# Patient Record
Sex: Female | Born: 1965 | Race: White | Hispanic: No | Marital: Married | Smoking: Never smoker
Health system: Southern US, Community
[De-identification: ages and names within clinical notes are randomized; demographics above are authoritative.]

## PROBLEM LIST (undated history)

## (undated) DIAGNOSIS — J309 Allergic rhinitis, unspecified: Secondary | ICD-10-CM

## (undated) DIAGNOSIS — E785 Hyperlipidemia, unspecified: Secondary | ICD-10-CM

## (undated) DIAGNOSIS — R112 Nausea with vomiting, unspecified: Secondary | ICD-10-CM

## (undated) DIAGNOSIS — E669 Obesity, unspecified: Secondary | ICD-10-CM

## (undated) DIAGNOSIS — R42 Dizziness and giddiness: Secondary | ICD-10-CM

## (undated) DIAGNOSIS — M5432 Sciatica, left side: Secondary | ICD-10-CM

## (undated) DIAGNOSIS — F32A Depression, unspecified: Secondary | ICD-10-CM

## (undated) DIAGNOSIS — Z8489 Family history of other specified conditions: Secondary | ICD-10-CM

## (undated) DIAGNOSIS — I10 Essential (primary) hypertension: Secondary | ICD-10-CM

## (undated) DIAGNOSIS — D649 Anemia, unspecified: Secondary | ICD-10-CM

## (undated) DIAGNOSIS — Z9889 Other specified postprocedural states: Secondary | ICD-10-CM

## (undated) DIAGNOSIS — F329 Major depressive disorder, single episode, unspecified: Secondary | ICD-10-CM

## (undated) DIAGNOSIS — E8881 Metabolic syndrome: Secondary | ICD-10-CM

## (undated) DIAGNOSIS — L409 Psoriasis, unspecified: Secondary | ICD-10-CM

## (undated) HISTORY — DX: Allergic rhinitis, unspecified: J30.9

## (undated) HISTORY — DX: Obesity, unspecified: E66.9

## (undated) HISTORY — PX: BARIATRIC SURGERY: SHX1103

## (undated) HISTORY — DX: Depression, unspecified: F32.A

## (undated) HISTORY — PX: CHOLECYSTECTOMY: SHX55

## (undated) HISTORY — PX: DILATION AND CURETTAGE OF UTERUS: SHX78

## (undated) HISTORY — DX: Essential (primary) hypertension: I10

## (undated) HISTORY — DX: Psoriasis, unspecified: L40.9

## (undated) HISTORY — DX: Major depressive disorder, single episode, unspecified: F32.9

## (undated) HISTORY — DX: Anemia, unspecified: D64.9

## (undated) HISTORY — DX: Metabolic syndrome: E88.81

## (undated) HISTORY — DX: Hyperlipidemia, unspecified: E78.5

## (undated) HISTORY — DX: Metabolic syndrome: E88.810

---

## 2006-05-21 ENCOUNTER — Ambulatory Visit: Payer: Self-pay | Admitting: Family Medicine

## 2007-04-10 IMAGING — RF DG MULTISYS EXAM
1 series · 12 of 12 positions shown · non-contrast
Comparison: none

[Series 102: run · 12 of 12 slices shown]
[im 1/12]
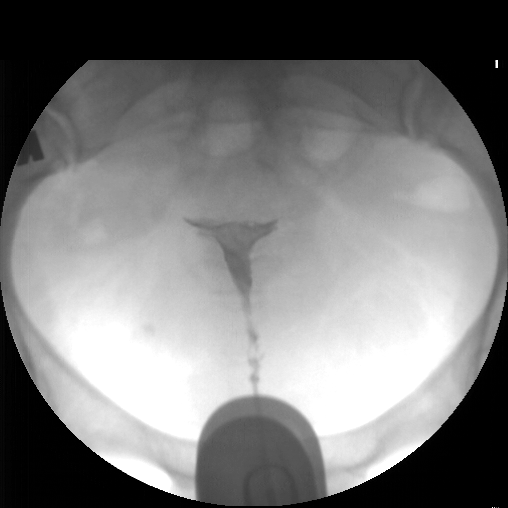
[im 2/12]
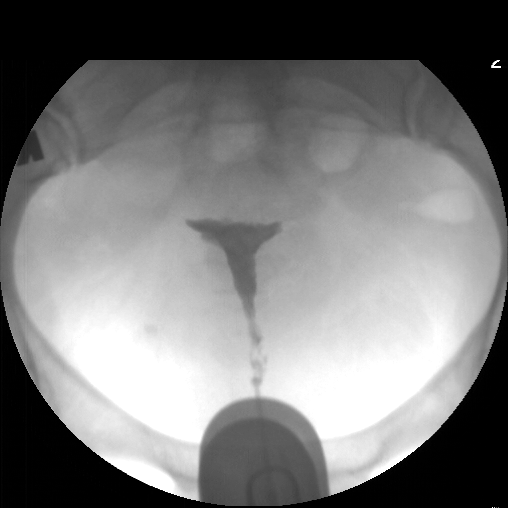
[im 3/12]
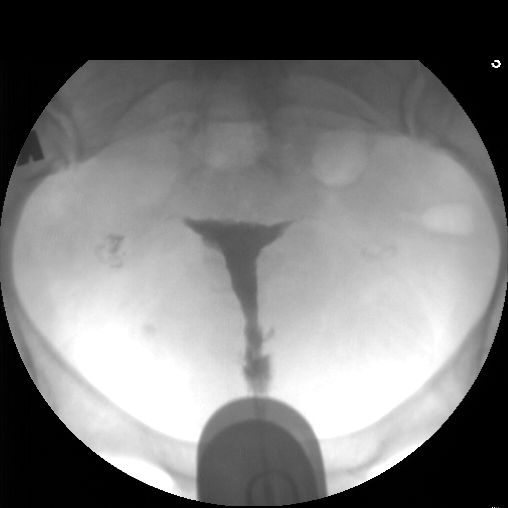
[im 4/12]
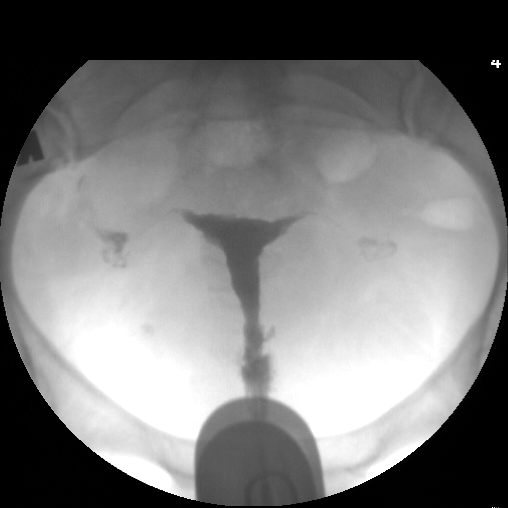
[im 5/12]
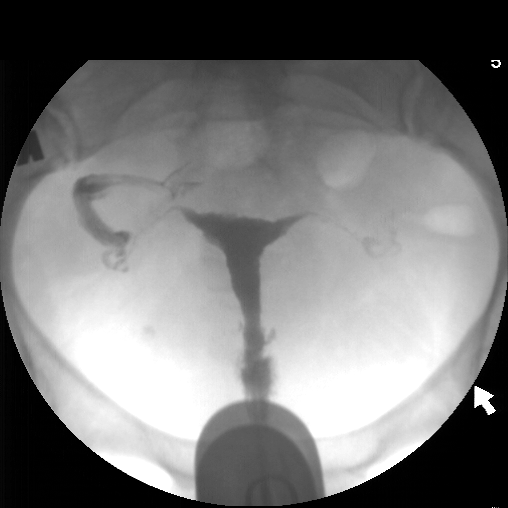
[im 6/12]
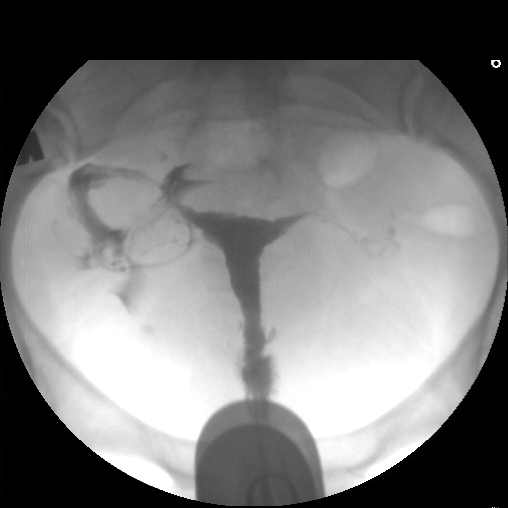
[im 7/12]
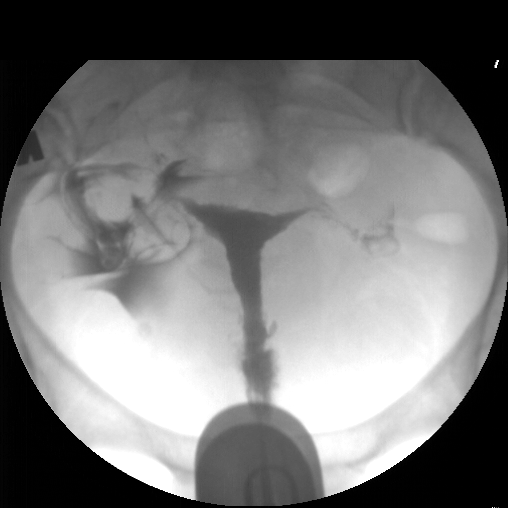
[im 8/12]
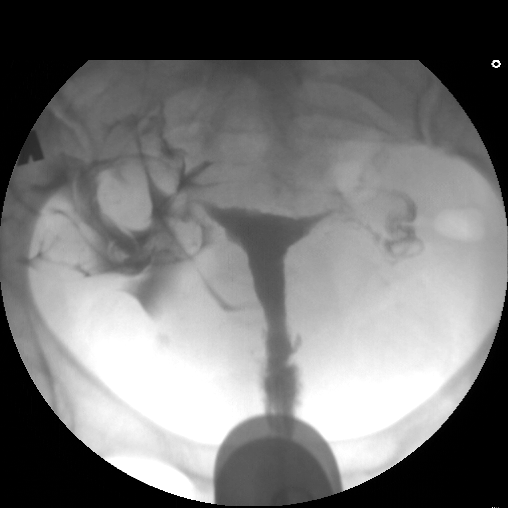
[im 9/12]
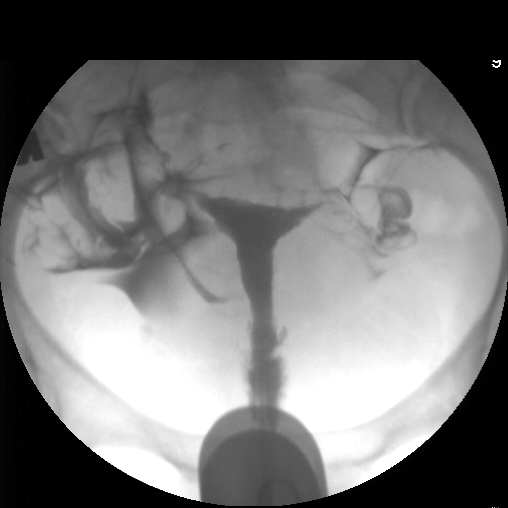
[im 10/12]
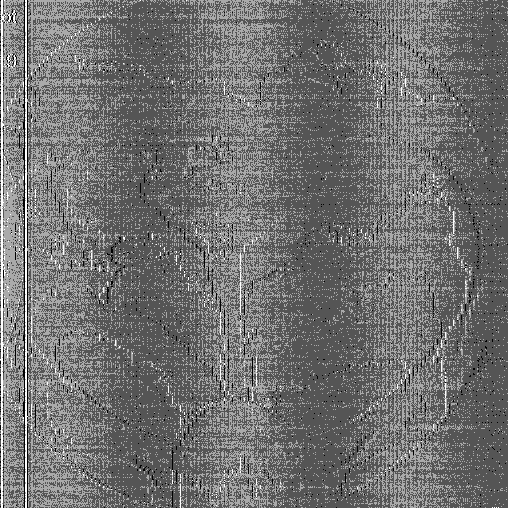
[im 11/12]
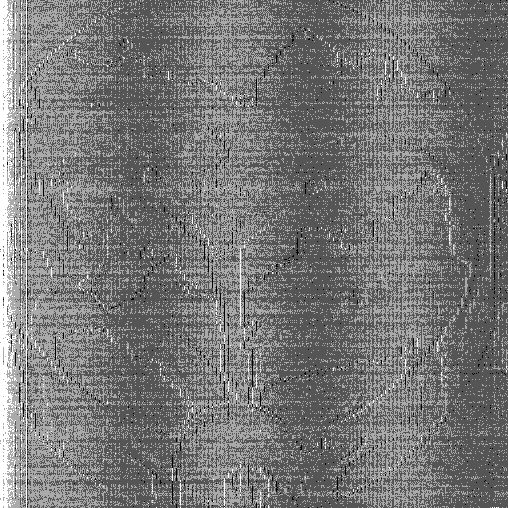
[im 12/12]
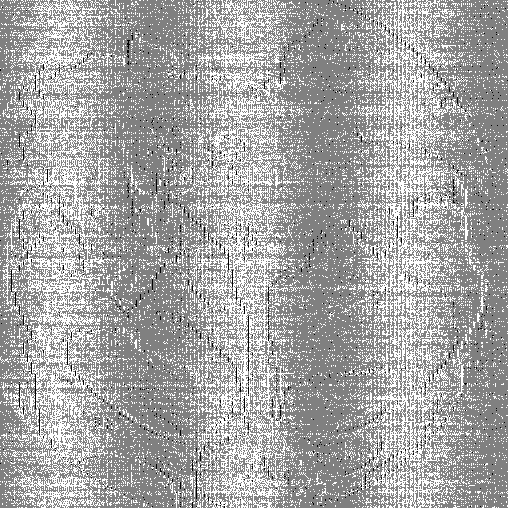

[12 of 12 positions shown; findings below may reference images not displayed]

Canned report from images found in remote index.

Refer to host system for actual result text.

## 2007-04-29 ENCOUNTER — Ambulatory Visit (HOSPITAL_COMMUNITY): Admission: RE | Admit: 2007-04-29 | Discharge: 2007-04-29 | Payer: Self-pay | Admitting: Gynecology

## 2007-06-10 ENCOUNTER — Ambulatory Visit (HOSPITAL_COMMUNITY): Admission: AD | Admit: 2007-06-10 | Discharge: 2007-06-10 | Payer: Self-pay | Admitting: Obstetrics & Gynecology

## 2007-06-10 ENCOUNTER — Encounter (INDEPENDENT_AMBULATORY_CARE_PROVIDER_SITE_OTHER): Payer: Self-pay | Admitting: Obstetrics & Gynecology

## 2007-12-14 ENCOUNTER — Ambulatory Visit: Payer: Self-pay | Admitting: Family Medicine

## 2007-12-23 ENCOUNTER — Ambulatory Visit: Payer: Self-pay | Admitting: Family Medicine

## 2008-01-31 IMAGING — US US OB TRANSVAGINAL MODIFY
1 series · 14 of 28 positions shown · non-contrast
Comparison: none

OBSTETRICAL ULTRASOUND:

 This ultrasound exam was performed in the [HOSPITAL] Ultrasound Department.  The OB US report was generated in the AS system, and faxed to the ordering physician.  This report is also available in [REDACTED] PACS.

[Series 1: us ob transvaginal modify · 0.27mm/px · 14 of 48 slices shown]
[im 2/48]
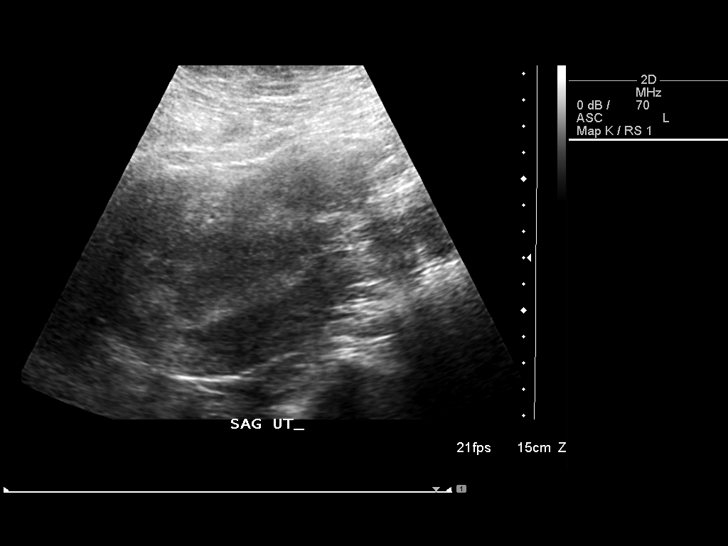
[im 6/48]
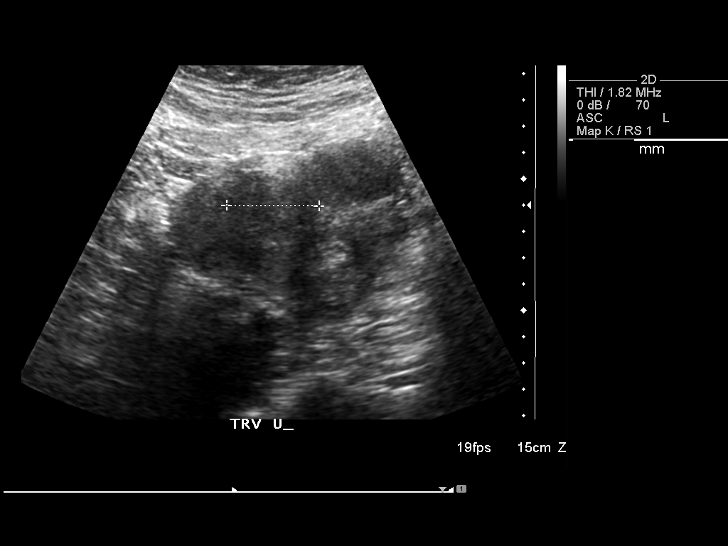
[im 9/48]
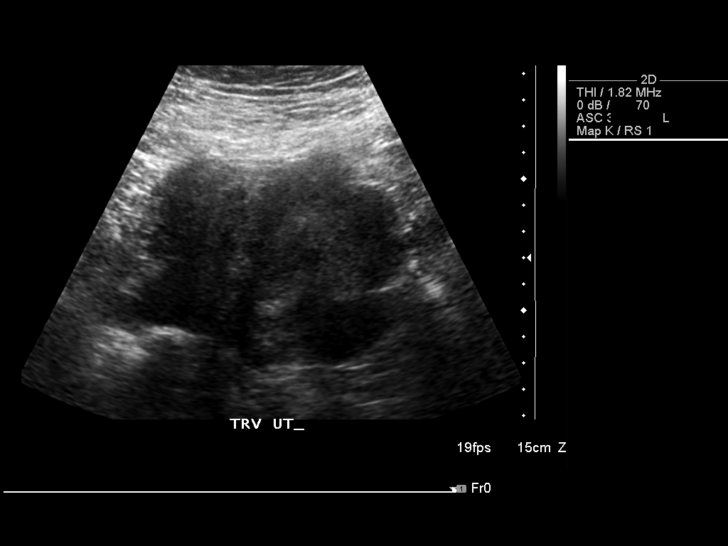
[im 13/48]
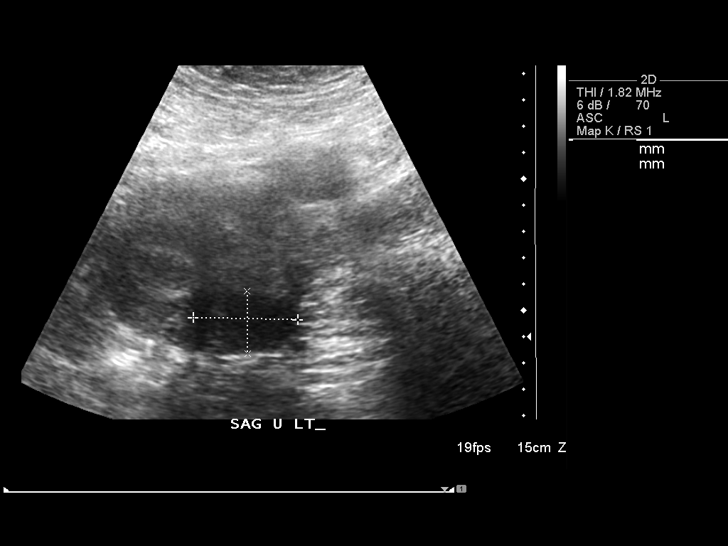
[im 16/48]
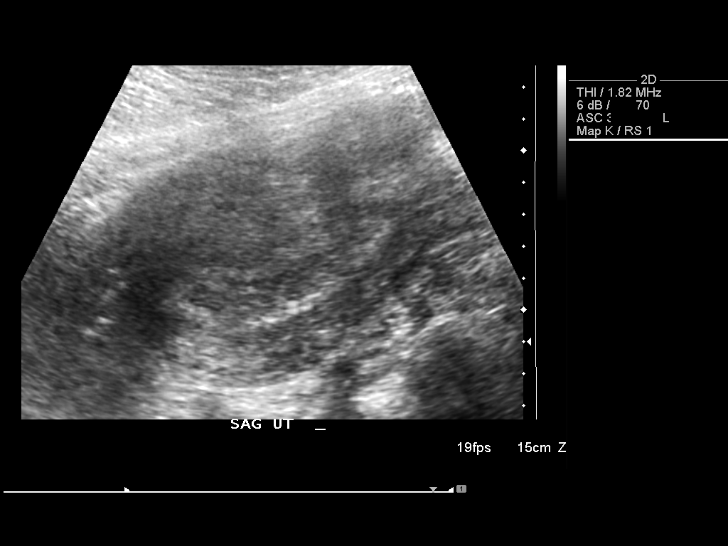
[im 20/48]
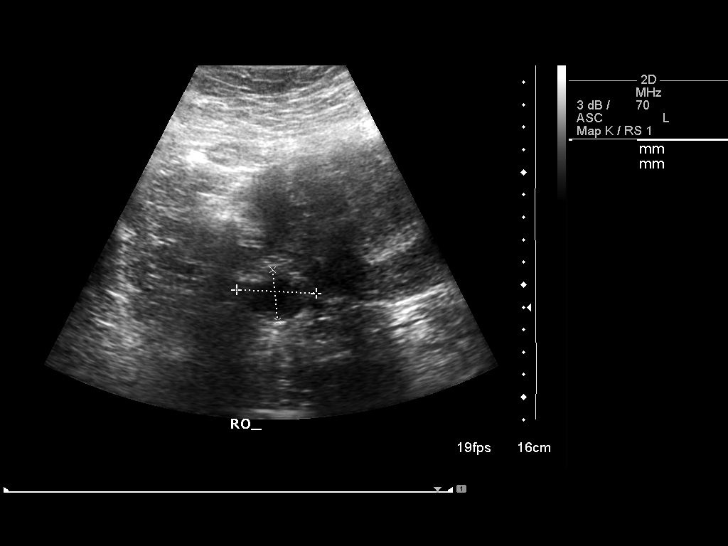
[im 23/48]
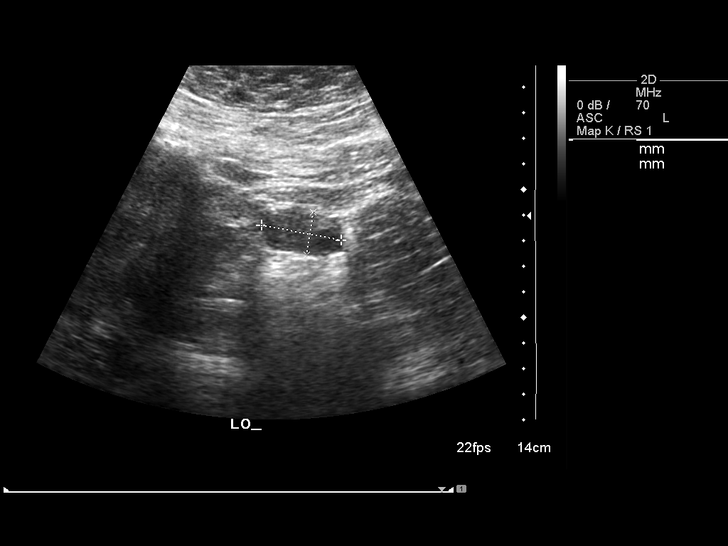
[im 27/48]
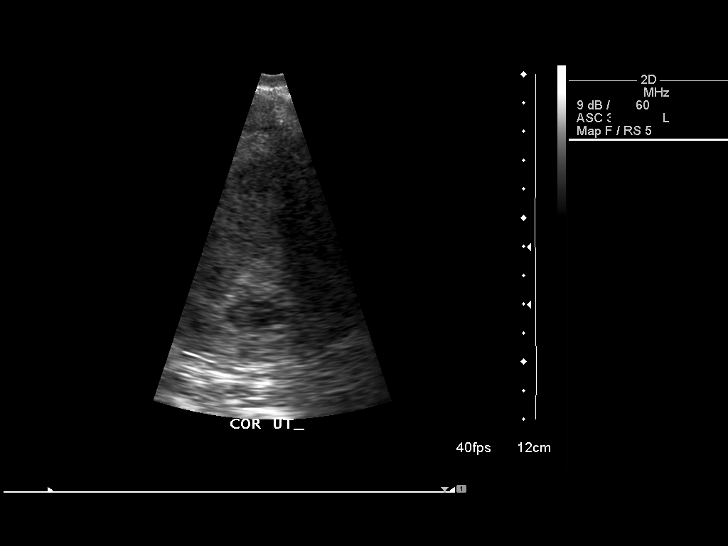
[im 30/48]
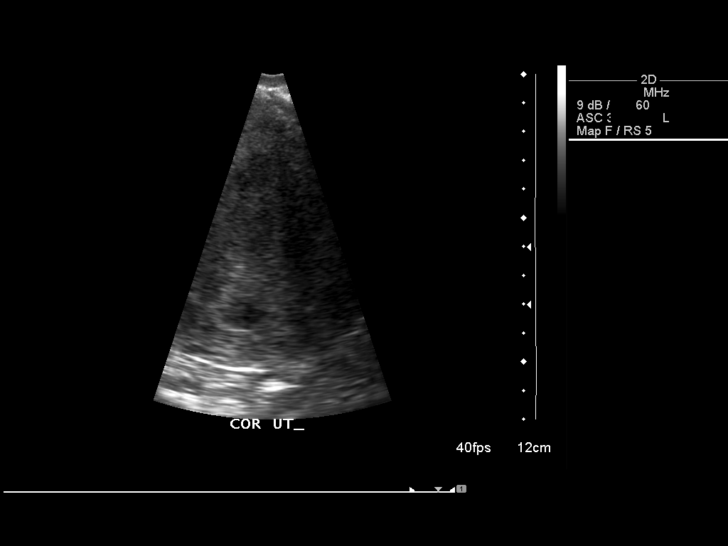
[im 34/48]
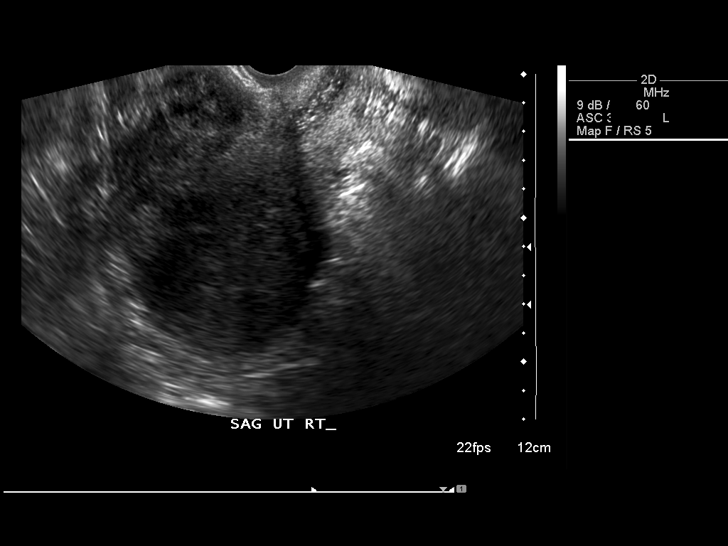
[im 37/48]
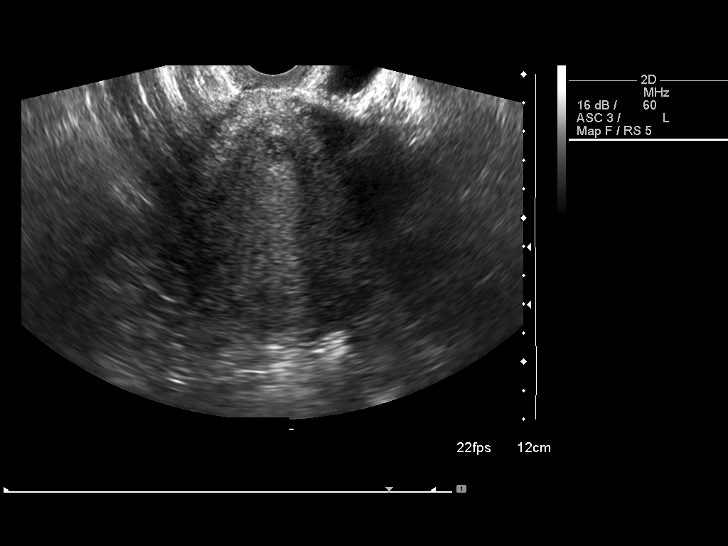
[im 41/48]
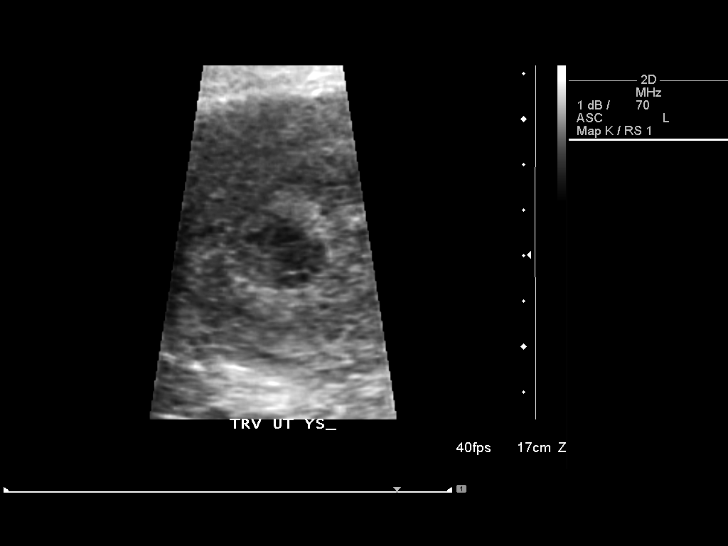
[im 44/48]
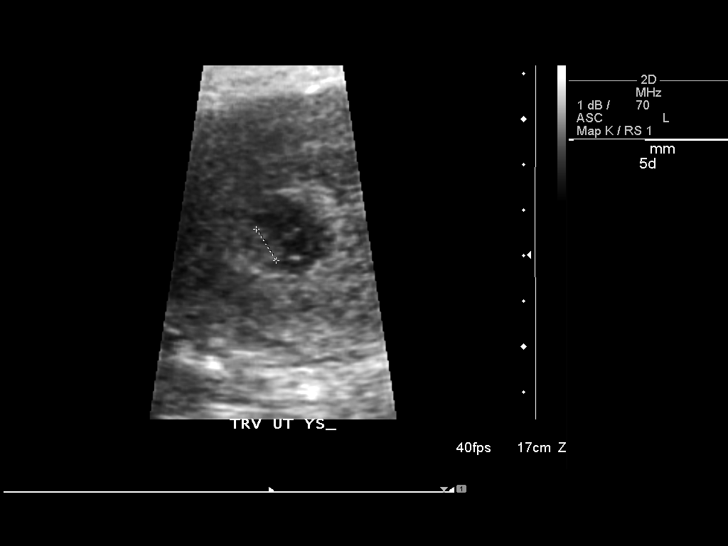
[im 48/48]
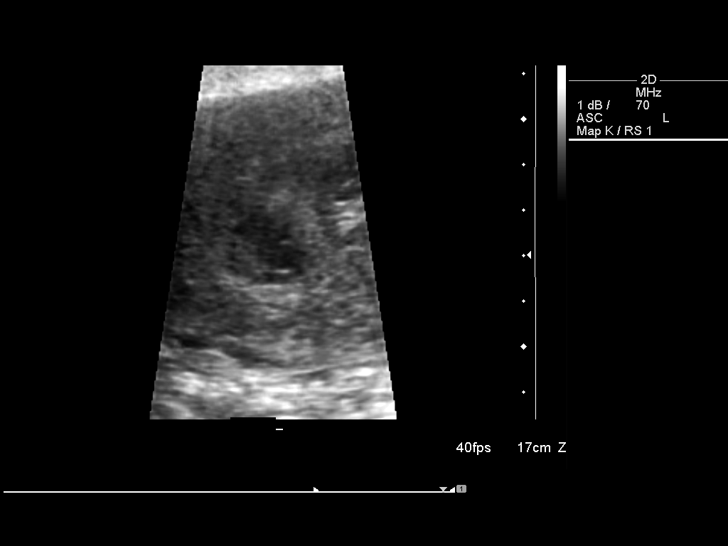

[14 of 28 positions shown; findings below may reference images not displayed]

IMPRESSION: See AS Obstetric US report.

## 2008-06-21 ENCOUNTER — Ambulatory Visit: Payer: Self-pay | Admitting: General Surgery

## 2008-09-25 IMAGING — US ULTRASOUND RIGHT BREAST
1 series · 13 of 13 positions shown · non-contrast
Comparison: none

REASON FOR EXAM: right breast density  US if needed
COMMENTS:

[Series 1: ultrasound right breast · 13 of 13 slices shown]
[im 1/13]
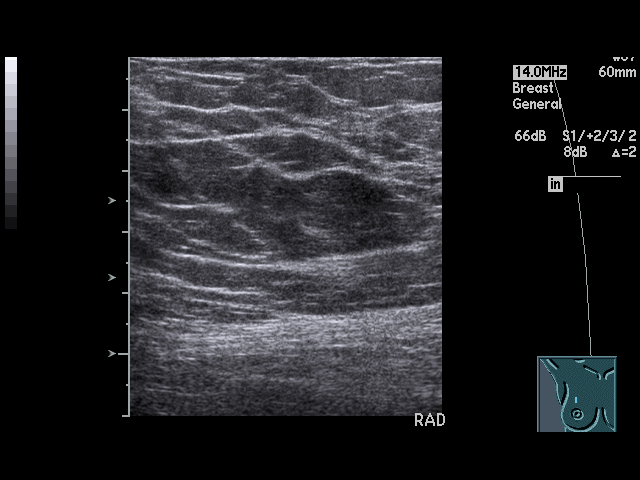
[im 2/13]
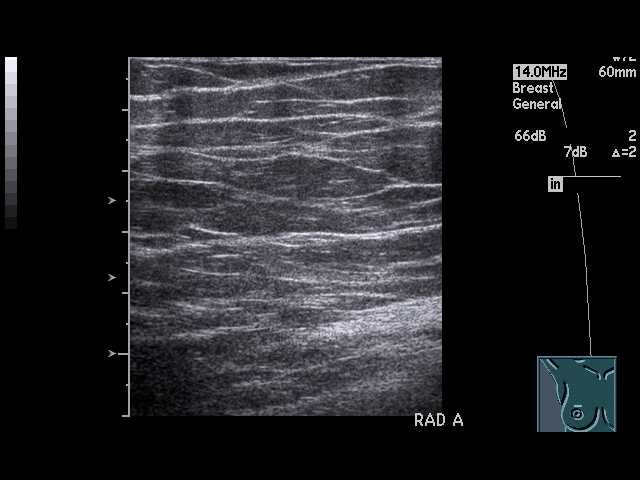
[im 3/13]
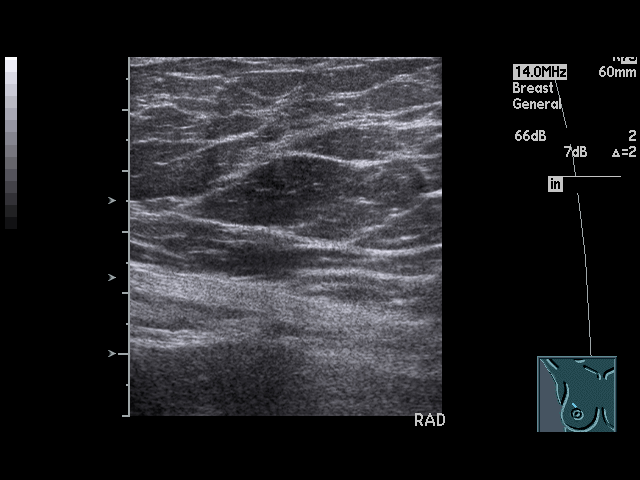
[im 4/13]
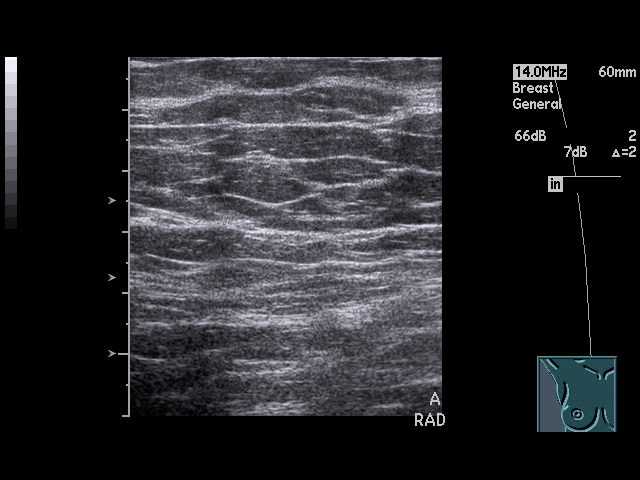
[im 5/13]
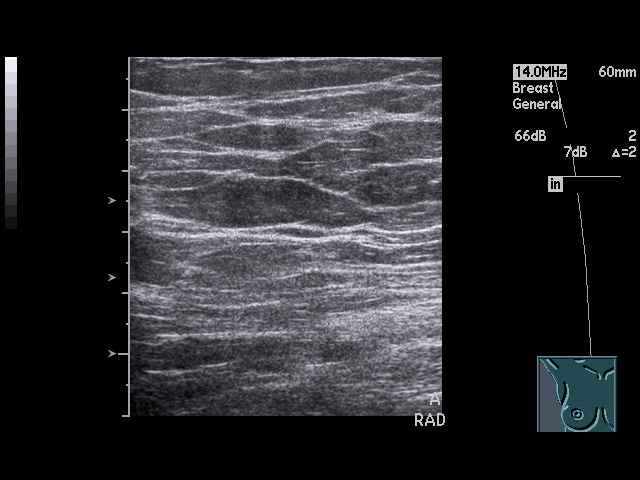
[im 6/13]
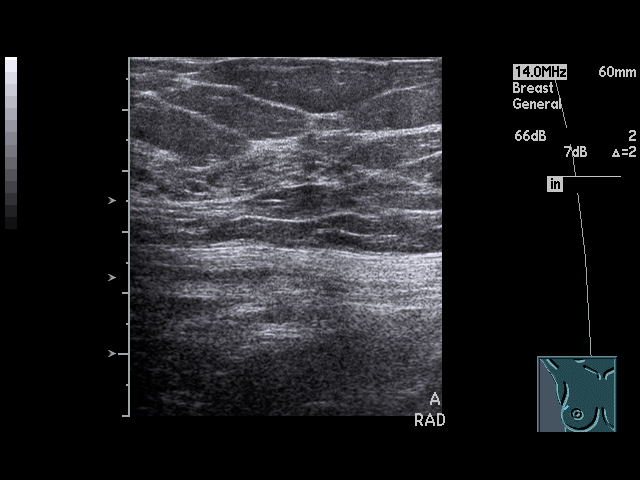
[im 7/13]
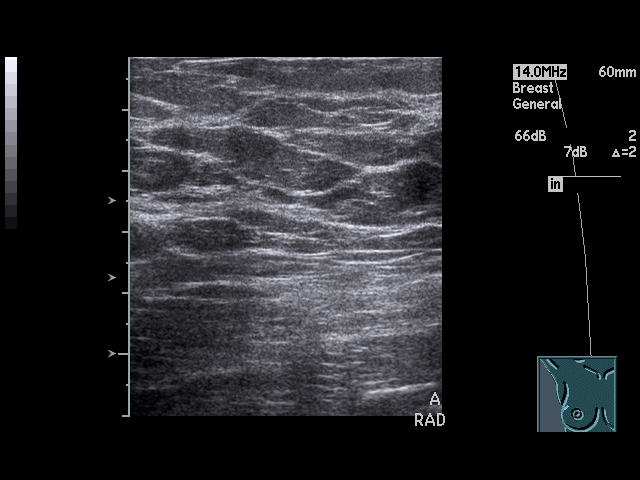
[im 8/13]
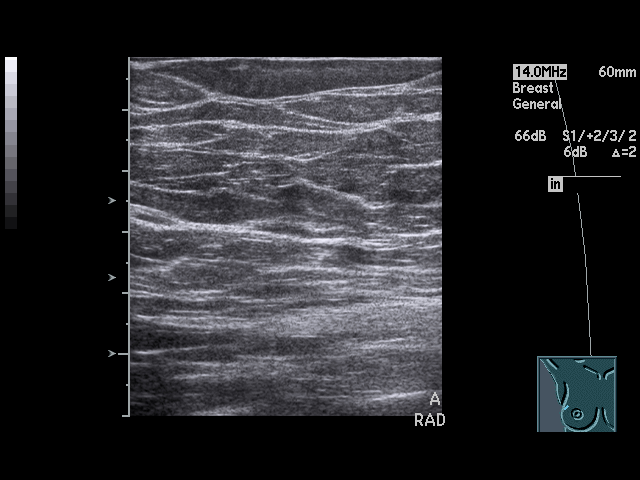
[im 9/13]
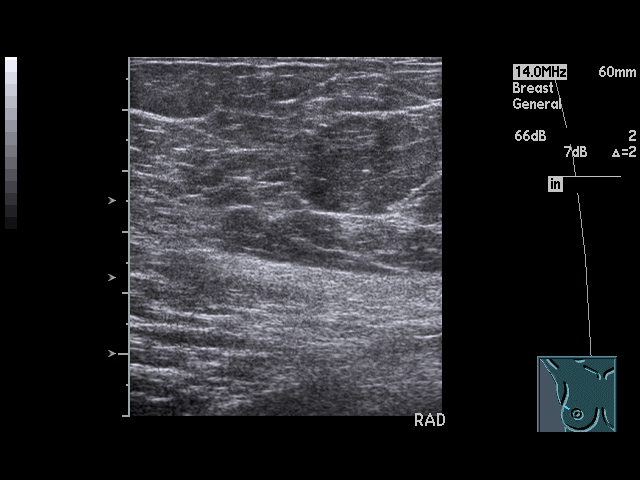
[im 10/13]
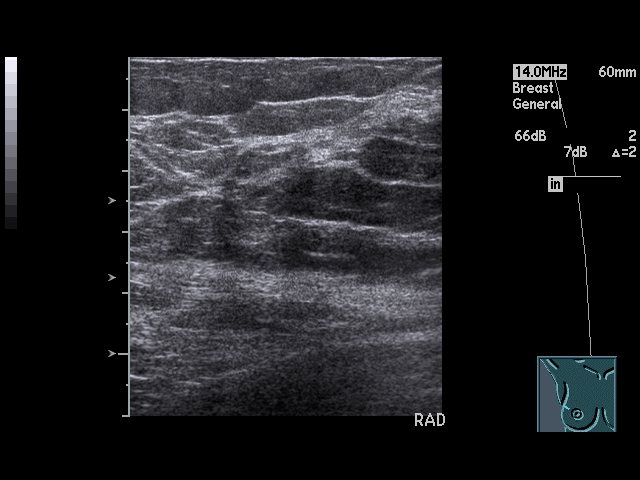
[im 11/13]
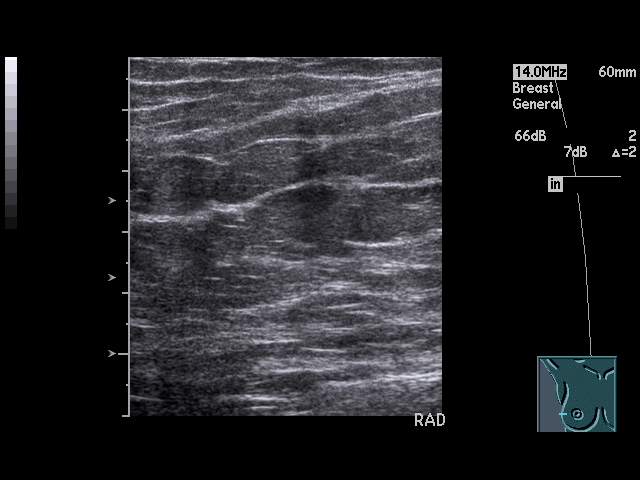
[im 12/13]
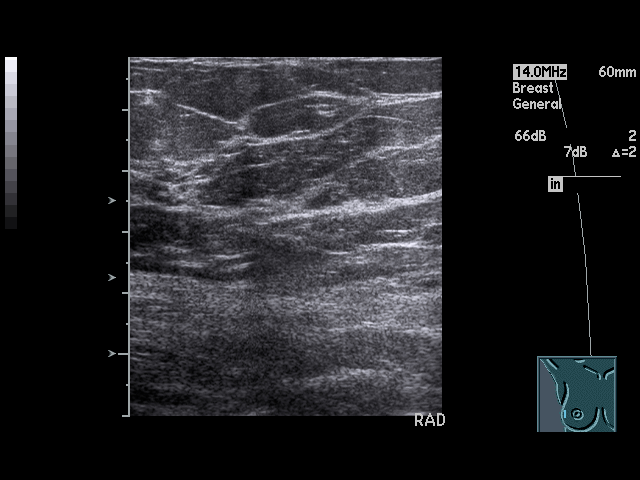
[im 13/13]
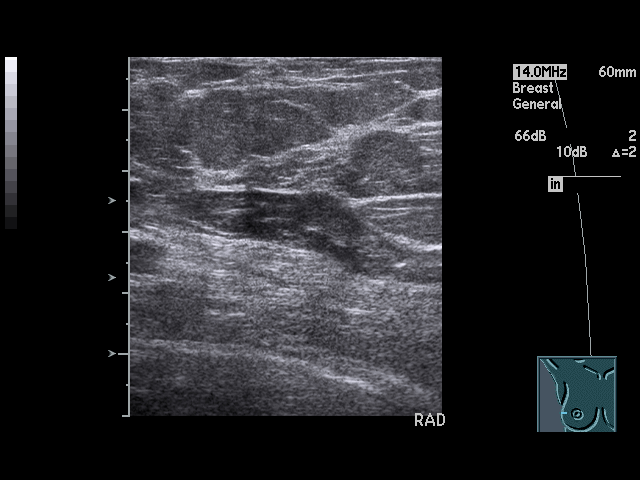

[13 of 13 positions shown; findings below may reference images not displayed]

PROCEDURE:     US  - US BREAST RIGHT  - December 23, 2007 [DATE]

RESULT:     This is a redictation of a previously dictated report.

The region of interest within the RIGHT breast was further evaluated with
ultrasound from the 9 o'clock to the 12 o'clock position.  No solid or
cystic sonographic abnormalities are identified.
IMPRESSION: Unremarkable focused RIGHT breast ultrasound.  Please refer to the
additional radiographic view dictation for complete discussion.

## 2008-12-14 ENCOUNTER — Ambulatory Visit: Payer: Self-pay | Admitting: General Surgery

## 2008-12-21 ENCOUNTER — Ambulatory Visit: Payer: Self-pay | Admitting: General Surgery

## 2009-09-17 IMAGING — MG MAM DGTL SCREENING MAMMO W/CAD
1 series · 6 of 6 positions shown · non-contrast
Comparison: none

REASON FOR EXAM: SCREENING MAMMOGRAM YDU.NY YEARLY
COMMENTS:  Submitted by practice: [HOSPITAL] Scheduled by
user:

PROCEDURE:     MAM - MAM DGTL SCREENING MAMMO W/CAD  - December 14, 2008  [DATE]
RESULT:     Comparison is made to 12/14/2007, 12/23/2007 and 06/21/2008.

[R CC · right · 6 of 6 slices shown]
[im 1/6]
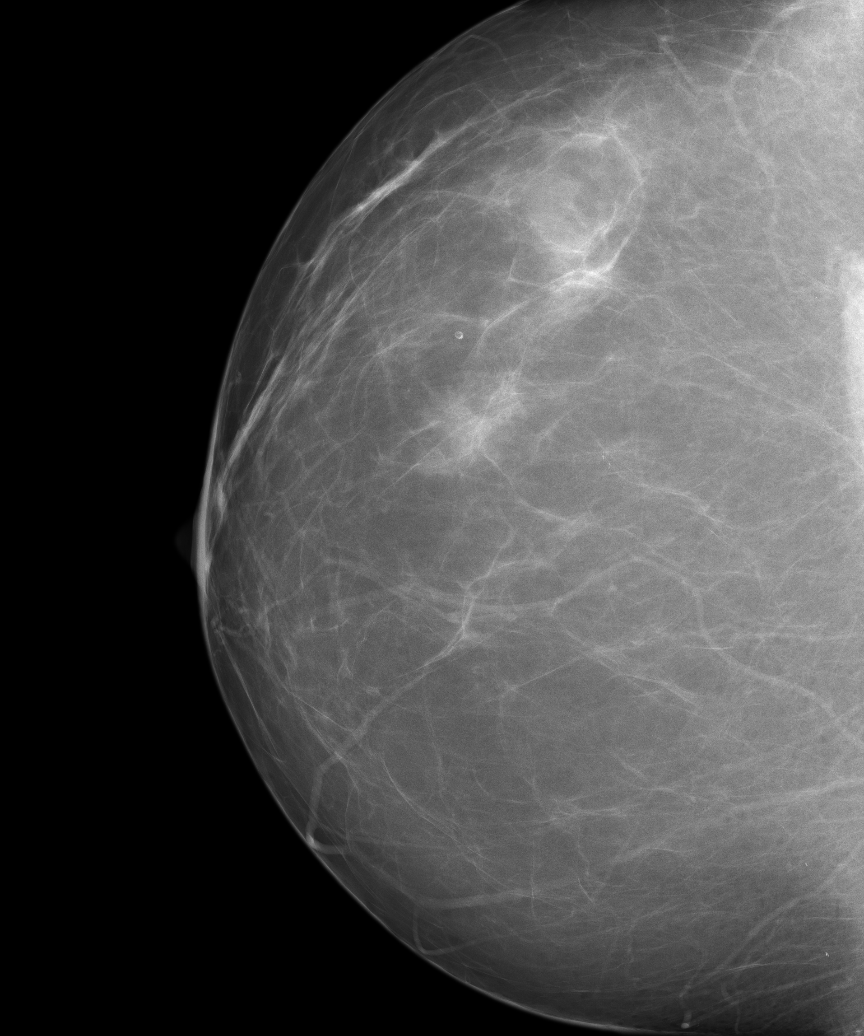
[im 2/6]
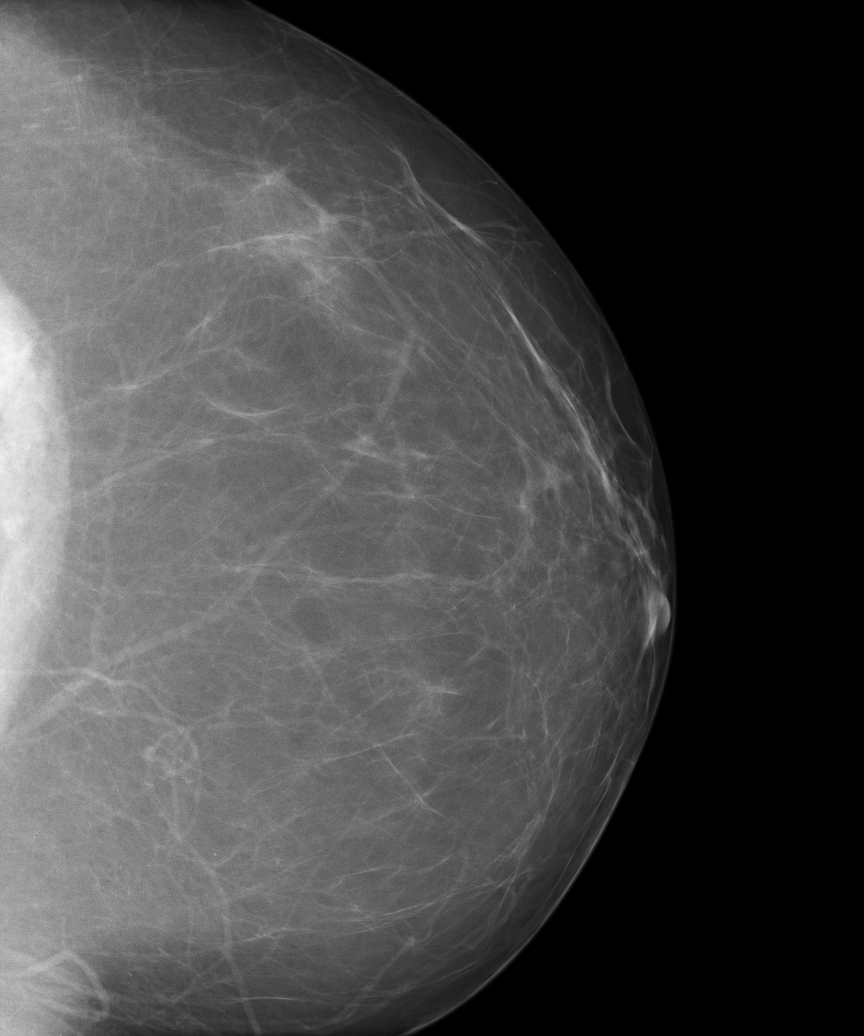
[im 3/6]
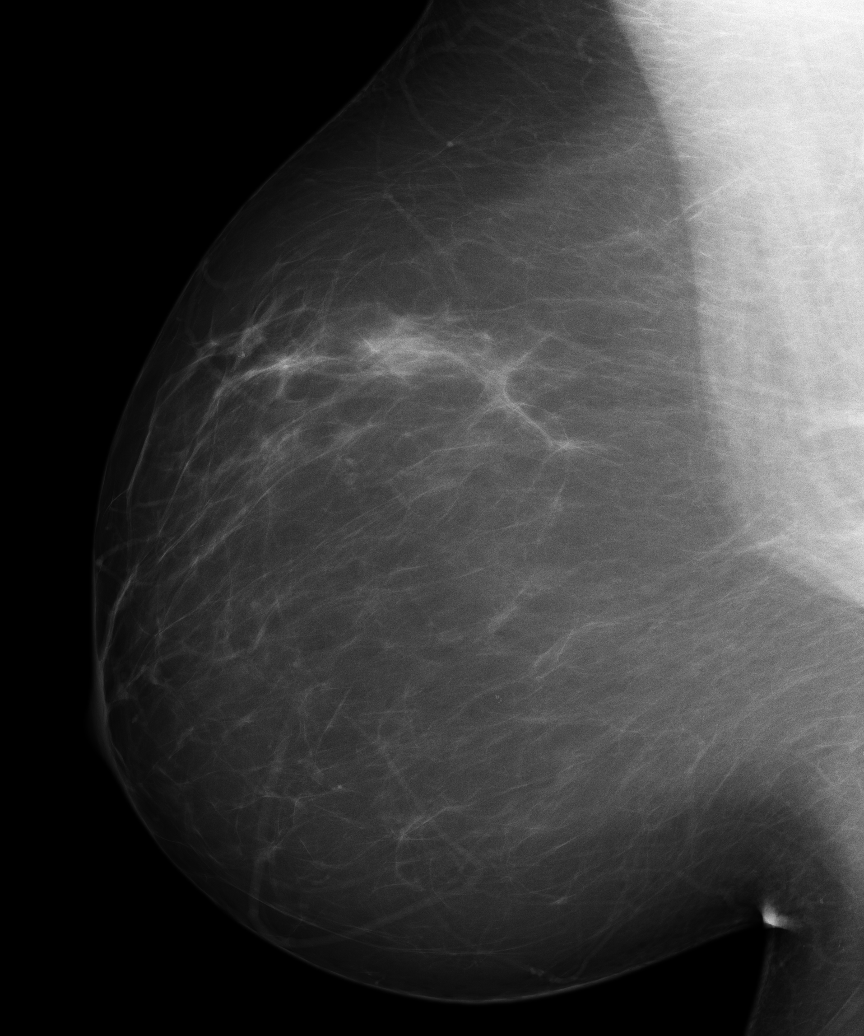
[im 4/6]
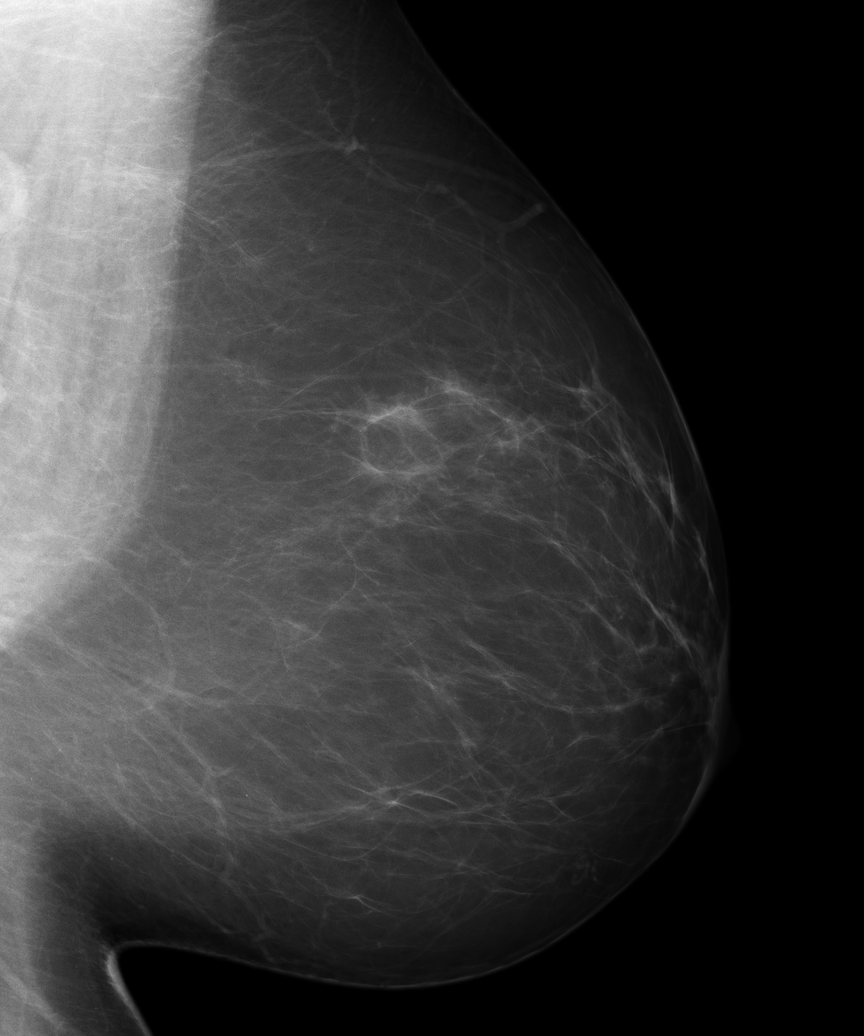
[im 5/6]
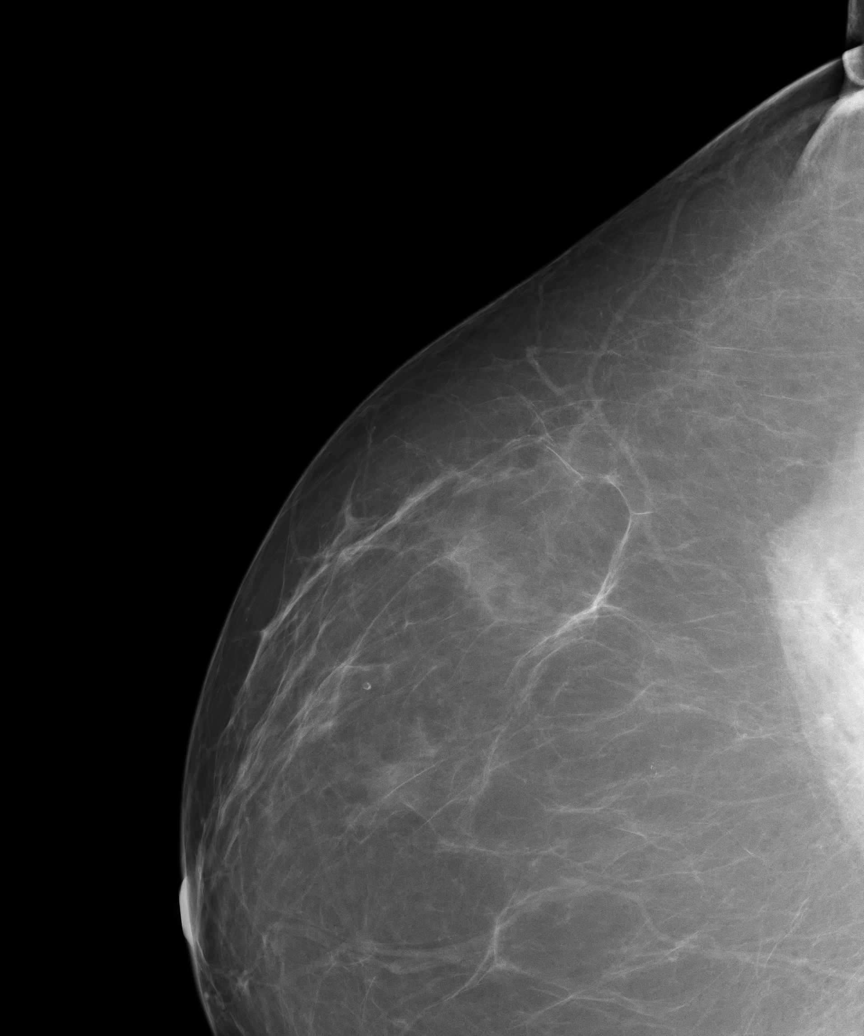
[im 6/6]
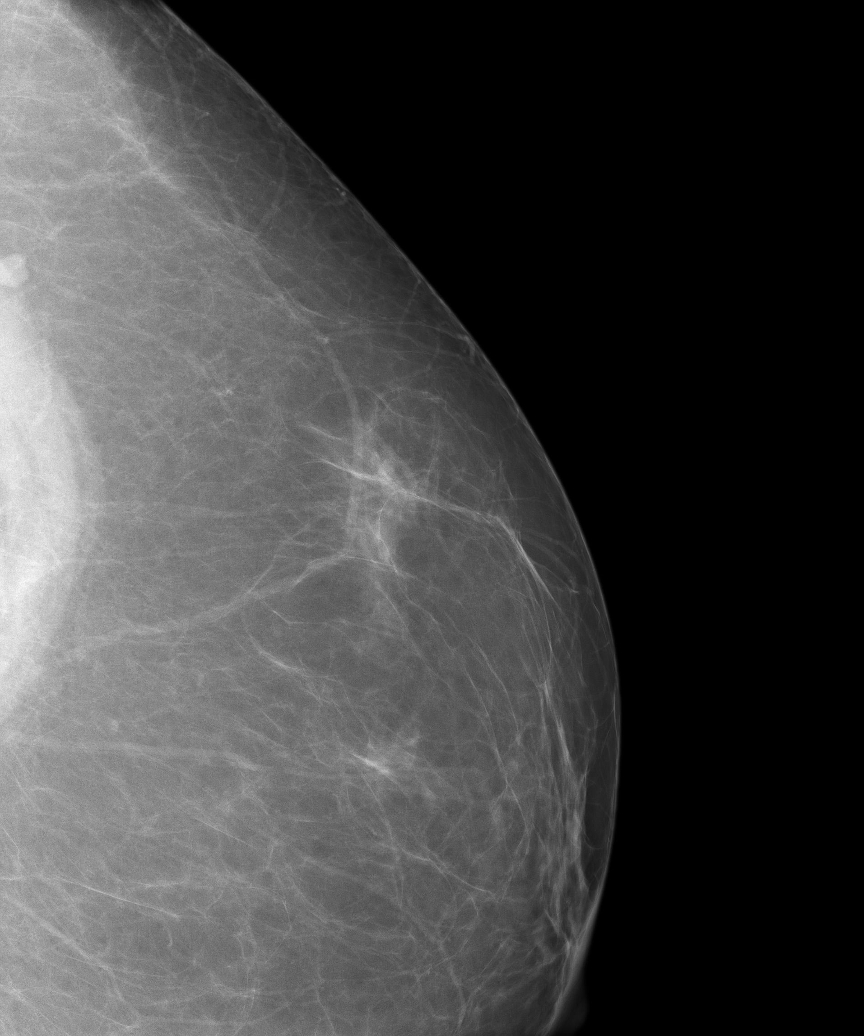

[6 of 6 positions shown; findings below may reference images not displayed]

FINDINGS: Bilateral breasts demonstrate a scattered fibroglandular density.
There is a macrolobulated nodule in the lateral left breast on the
exaggerated lateral CC view. This is not delineated on the MLO view. There
are no other areas of dominant masses, architectural distortion or clusters
of suspicious microcalcifications.
IMPRESSION: Nodular density in the lateral left breast on the
exaggerated CC view. Recommend further evaluation with spot compression
views of the far upper outer left breast with possible ultrasound to follow.

BI-RADS: Category 0 - Needs Additional Imaging Evaluation

## 2010-12-31 NOTE — Op Note (Signed)
Ashlee Cruz, REXRODE                 ACCOUNT NO.:  [id number]   MEDICAL RECORD NO.:  [id number]          PATIENT TYPE:  AMB   LOCATION:  [facility]                          FACILITY:  [facility]   PHYSICIAN:  Ilda Mori, M.D.   DATE OF BIRTH:  [birth year]   DATE OF PROCEDURE:  06/10/2007  DATE OF DISCHARGE:                               OPERATIVE REPORT   PREOPERATIVE DIAGNOSIS:  Incomplete abortion.   POSTOPERATIVE DIAGNOSIS:  Complete abortion.   PROCEDURE:  Dilatation and evacuation.   SURGEON:  Ilda Mori, M.D.   ANESTHESIA:  Paracervical block with IV sedation.   SPECIMENS:  Products of conception and were sent to pathology.   ESTIMATED BLOOD LOSS:  Minimal.   COMPLICATIONS:  None.   INDICATIONS:  This is a 45 year old primigravid female who presented to  the hospital with an incomplete abortion.  The patient had been found to  have a fetal demise at 12 weeks on ultrasound in the office on October  21.  She was scheduled for surgery on October 24.  She began cramping  and passed fetal tissue approximately 1:00 a.m. this morning and  presented to the hospital with heavy bleeding and cramps.  The decision  was made to proceed with dilatation and evacuation to remove the  retained placental tissue.  While waiting for surgery, the patient  passed what appeared to be an intact placenta, but the decision was made  to proceed with D&E to ensure that no was placental fragments, remained.   DESCRIPTION OF PROCEDURE:  The patient is taken to the operating placed  in the dorsal supine position, IV sedation was administered, and a  paracervical block was placed after the vagina and perineum were prepped  and draped in sterile fashion.  A #10 suction curette was introduced  into the endometrial cavity without dilatation, and a suction aspiration  was performed, followed by sharp curettage.  Minimal tissue was  obtained.  The procedure was then terminated, and the patient left the  operating room in good condition.      Ilda Mori, M.D.  Electronically Signed     RK/MEDQ  D:  06/10/2007  T:  06/10/2007  Job:  [id number]

## 2011-05-28 LAB — TYPE AND SCREEN

## 2011-05-28 LAB — CBC
HCT: 35.1 — ABNORMAL LOW
MCHC: 34.7
MCV: 92
RBC: 3.81 — ABNORMAL LOW
WBC: 10

## 2011-05-28 LAB — RPR: RPR Ser Ql: NONREACTIVE

## 2011-05-28 LAB — ABO/RH: ABO/RH(D): B POS

## 2011-08-29 ENCOUNTER — Ambulatory Visit: Payer: Self-pay | Admitting: Bariatrics

## 2011-09-04 ENCOUNTER — Ambulatory Visit: Payer: Self-pay | Admitting: Bariatrics

## 2011-09-05 ENCOUNTER — Ambulatory Visit: Payer: Self-pay | Admitting: Bariatrics

## 2011-09-19 ENCOUNTER — Ambulatory Visit: Payer: Self-pay | Admitting: Bariatrics

## 2011-10-17 ENCOUNTER — Ambulatory Visit: Payer: Self-pay | Admitting: Bariatrics

## 2011-10-28 ENCOUNTER — Inpatient Hospital Stay: Payer: Self-pay | Admitting: Bariatrics

## 2011-10-29 LAB — ALBUMIN: Albumin: 3.5 g/dL (ref 3.4–5.0)

## 2011-10-29 LAB — CBC WITH DIFFERENTIAL/PLATELET
Eosinophil %: 0 %
Lymphocyte #: 1.1 10*3/uL (ref 1.0–3.6)
MCH: 30.7 pg (ref 26.0–34.0)
MCHC: 33.8 g/dL (ref 32.0–36.0)
MCV: 91 fL (ref 80–100)
Monocyte #: 0.9 10*3/uL — ABNORMAL HIGH (ref 0.0–0.7)
Platelet: 296 10*3/uL (ref 150–440)
RBC: 4.32 10*6/uL (ref 3.80–5.20)
RDW: 13 % (ref 11.5–14.5)

## 2011-10-29 LAB — BASIC METABOLIC PANEL
BUN: 6 mg/dL — ABNORMAL LOW (ref 7–18)
EGFR (Non-African Amer.): >60
Glucose: 121 mg/dL — ABNORMAL HIGH (ref 65–99)
Sodium: 141 mmol/L (ref 136–145)

## 2011-11-20 ENCOUNTER — Ambulatory Visit: Payer: Self-pay | Admitting: Bariatrics

## 2011-12-17 ENCOUNTER — Ambulatory Visit: Payer: Self-pay | Admitting: Bariatrics

## 2012-06-01 IMAGING — CR DG CHEST 2V
1 series · 2 of 2 positions shown · non-contrast
Comparison: none

REASON FOR EXAM: HTN, Morbid Obesity, High Cholesterol
COMMENTS:

PROCEDURE:     DXR - DXR CHEST PA (OR AP) AND LATERAL  - August 29, 2011  [DATE]
RESULT:     The lung fields are clear. The heart, mediastinal and osseous
structures show no significant abnormalities.

[Series 1: pa · 0.17mm/px · 2 of 2 slices shown]
[im 1/2]
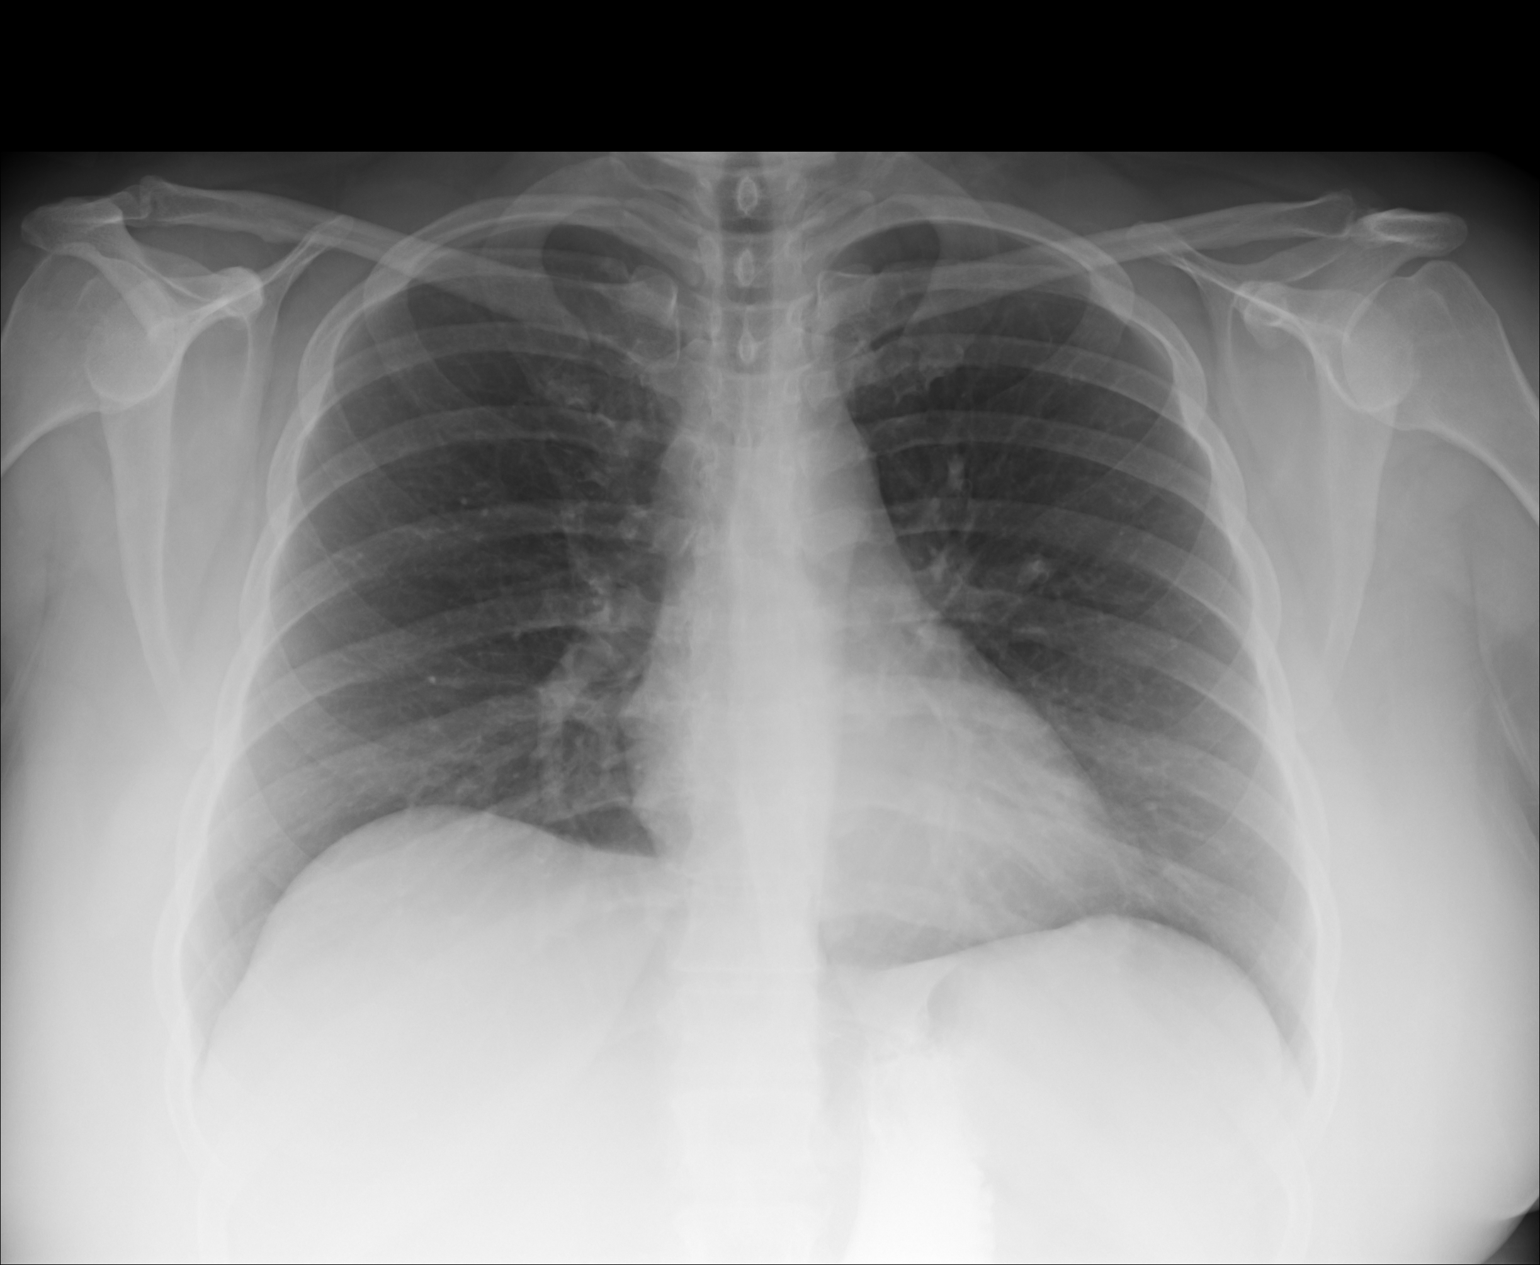
[im 2/2]
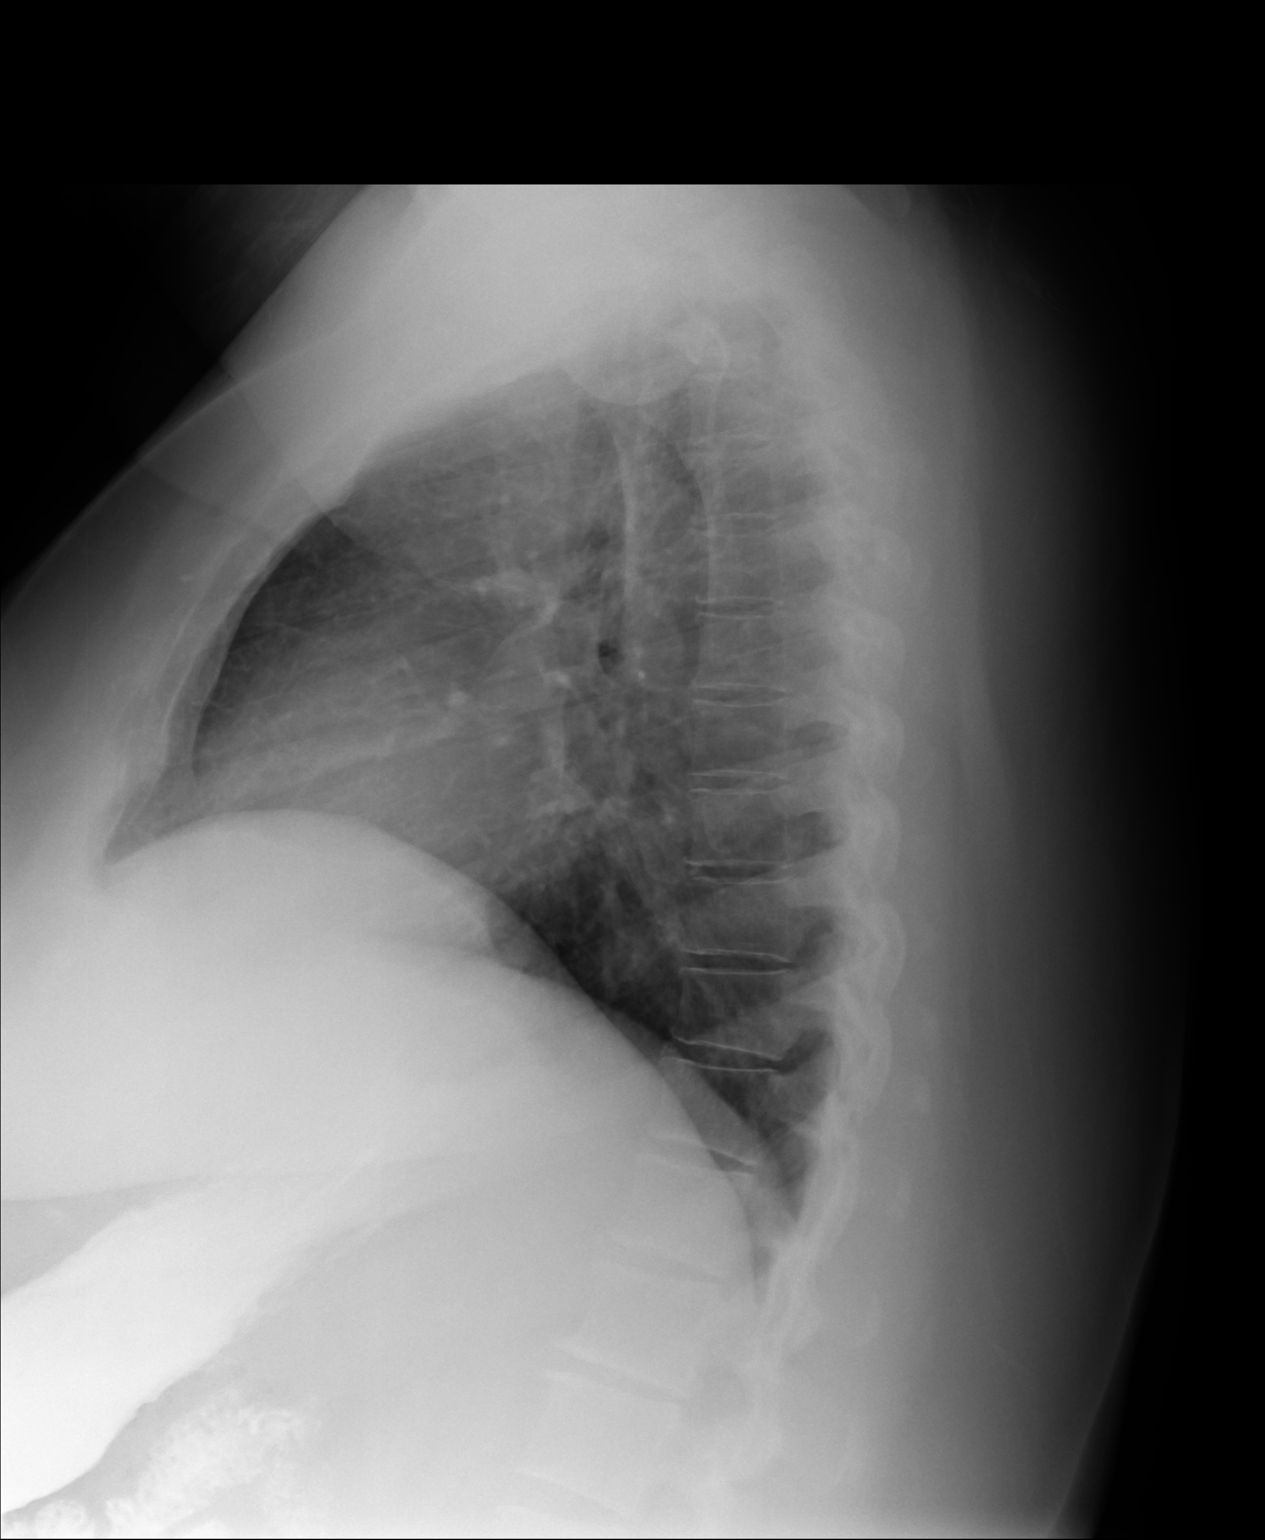

[2 of 2 positions shown; findings below may reference images not displayed]

IMPRESSION: 1.     No acute changes are identified.

## 2012-06-01 IMAGING — RF DG UGI W/O KUB
10 of 14 series · 15 of 24 positions shown · non-contrast
Comparison: none

REASON FOR EXAM: HTN high cholesterol [place] obesity PCOS
COMMENTS:

[Series 1: fluoro_barium 2fps_bw · 0.17mm/px · 2 of 14 frames shown (1 of 10)]
[frame 3/14]
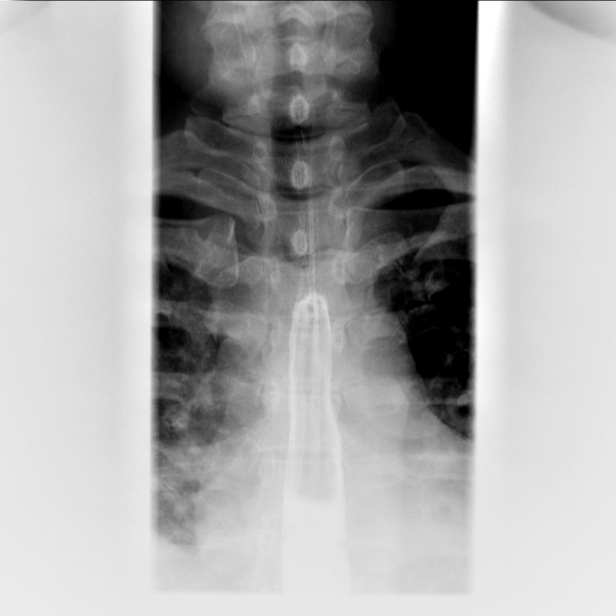
[frame 12/14]
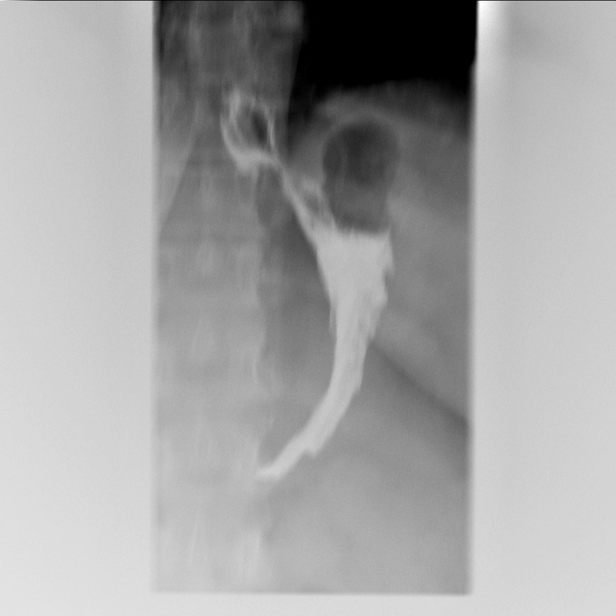

[Series 2: fluoro_barium 2fps_bw · 0.17mm/px · 2 of 10 frames shown (2 of 10)]
[frame 2/10]
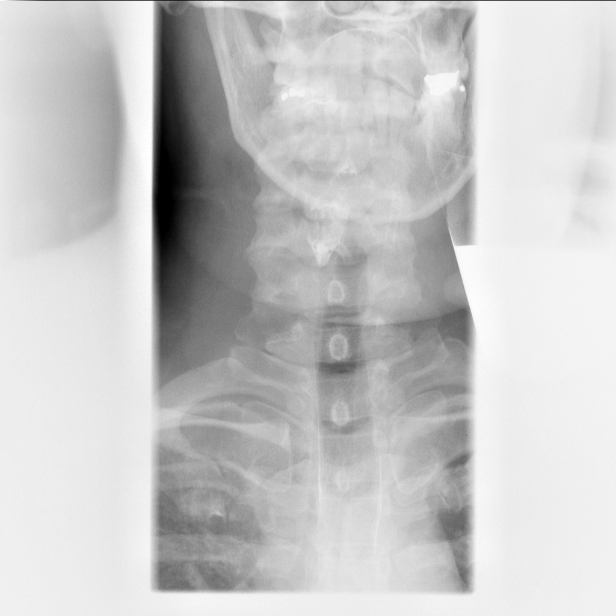
[frame 9/10]
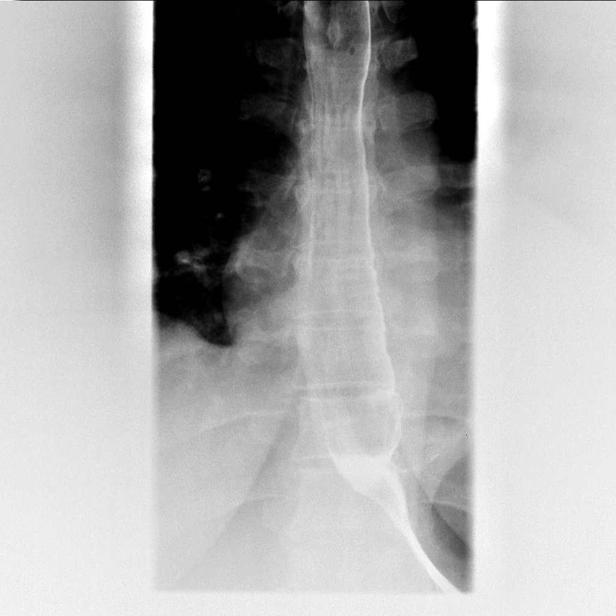

[Series 3: fluoro_barium 2fps_bw · 0.17mm/px · 2 of 3 frames shown (3 of 10)]
[frame 2/3]
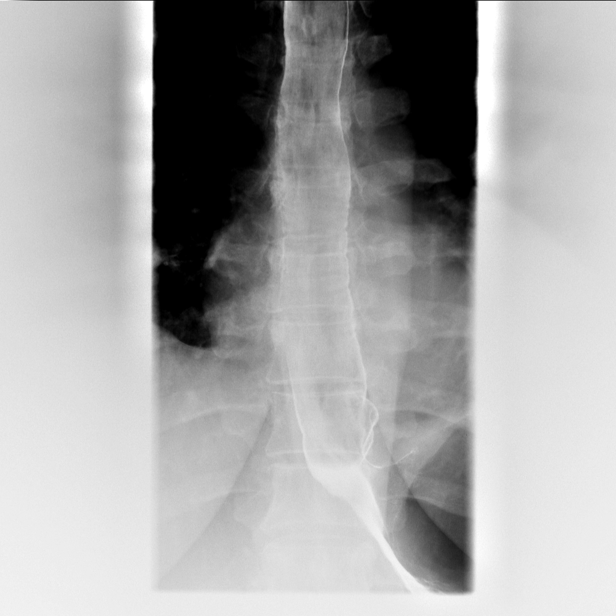
[frame 3/3]
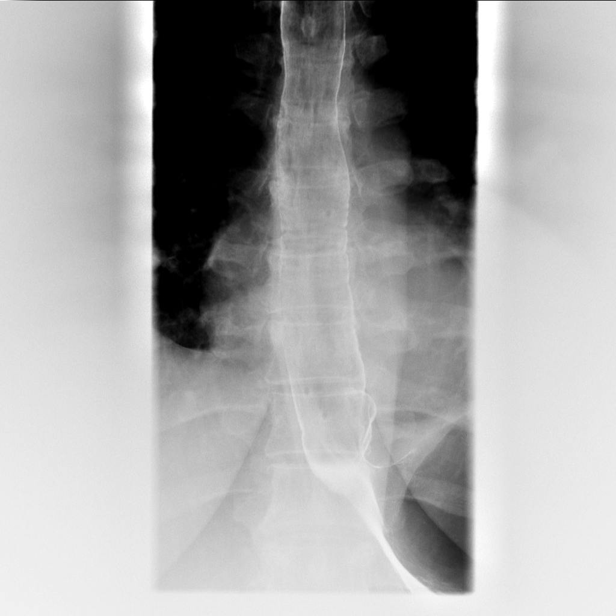

[Series 6: fluoro_barium 2fps_bw · 0.17mm/px · 2 of 11 frames shown (4 of 10)]
[frame 2/11]
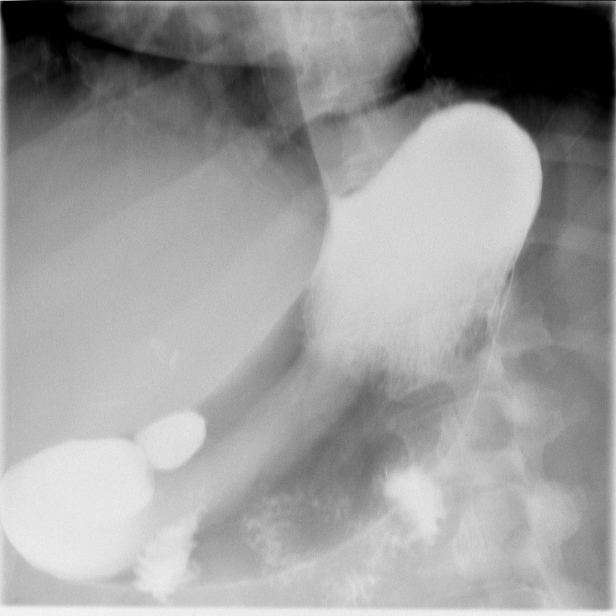
[frame 10/11]
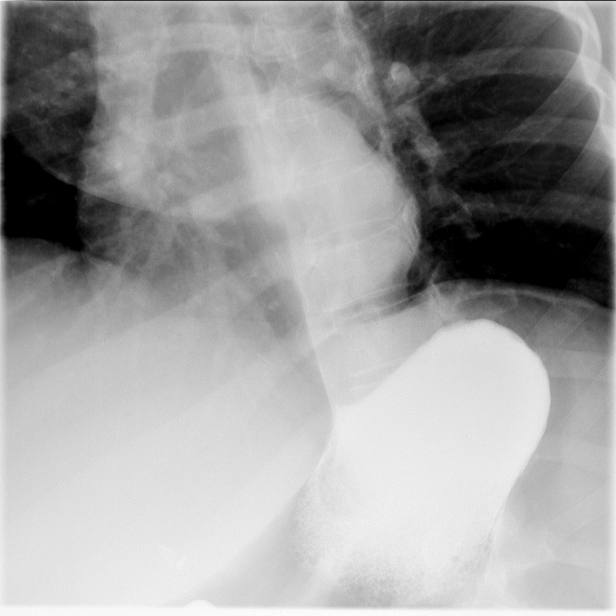

[Series 8: fluoro_barium 2fps_bw · 0.17mm/px · 1 of 1 slices shown (5 of 10)]
[im 1/1]
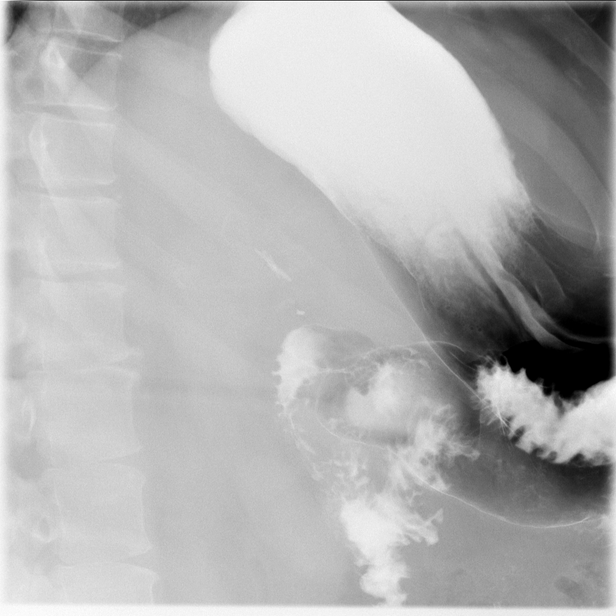

[Series 10: fluoro_barium 2fps_bw · 0.17mm/px · 2 of 20 frames shown (6 of 10)]
[frame 4/20]
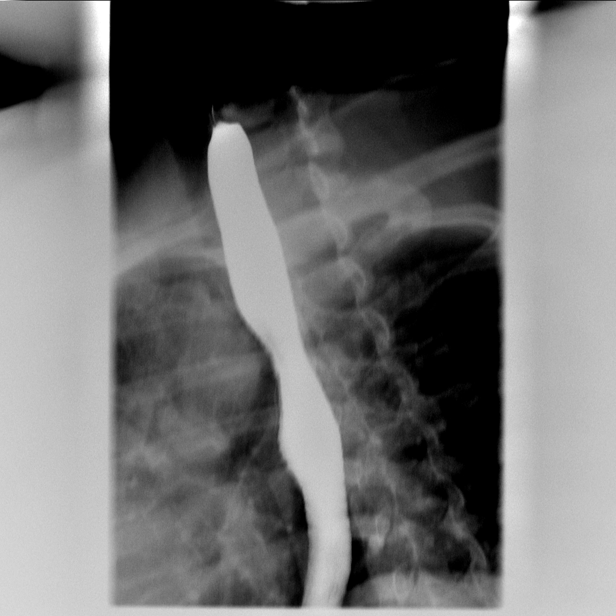
[frame 11/20]
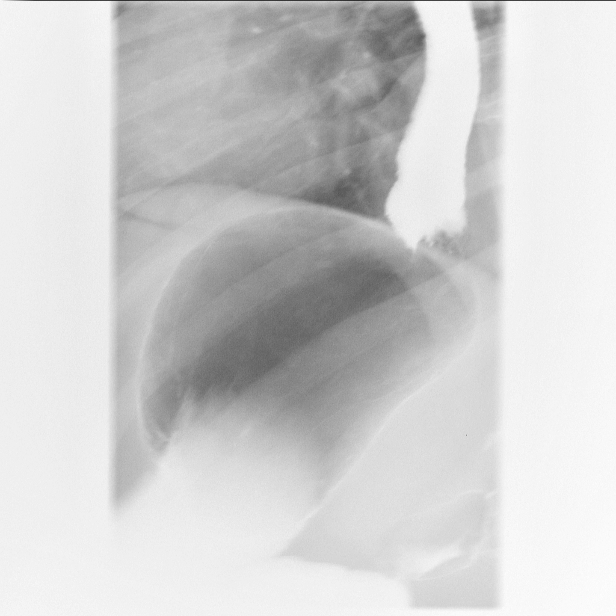

[Series 11: fluoro_barium 2fps_bw · 0.17mm/px · 1 of 6 frames shown (7 of 10)]
[frame 4/6]
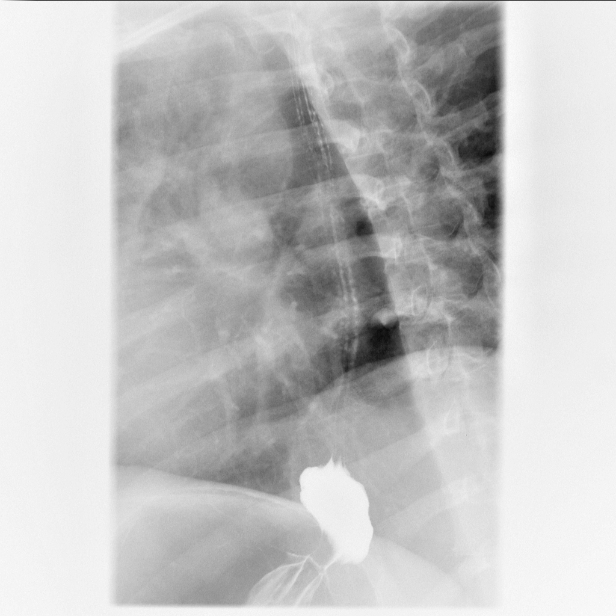

[Series 13: fluoro_barium 2fps_bw · 0.17mm/px · 1 of 1 slices shown (8 of 10)]
[im 1/1]
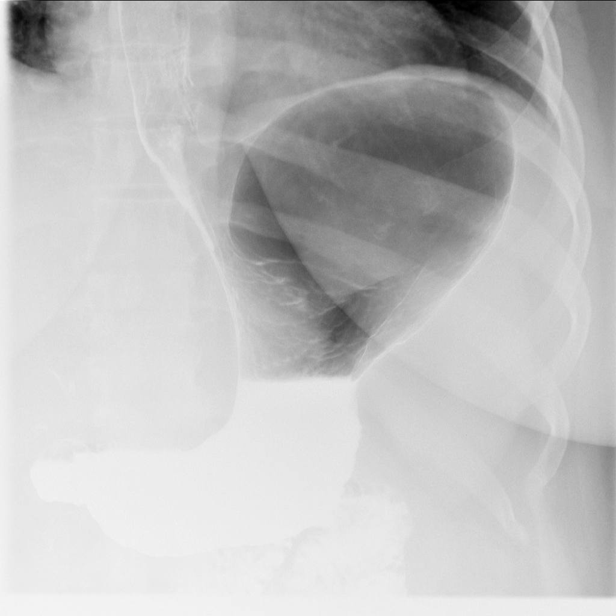

[Series 14: fluoro_barium 2fps_bw · 0.17mm/px · 1 of 2 frames shown (9 of 10)]
[frame 2/2]
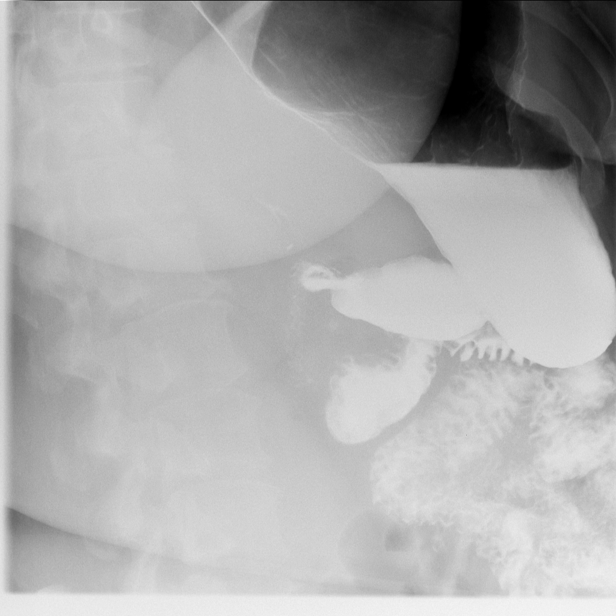

[Series 15: fluoro_barium 2fps_bw · 0.17mm/px · 1 of 6 frames shown (10 of 10)]
[frame 6/6]
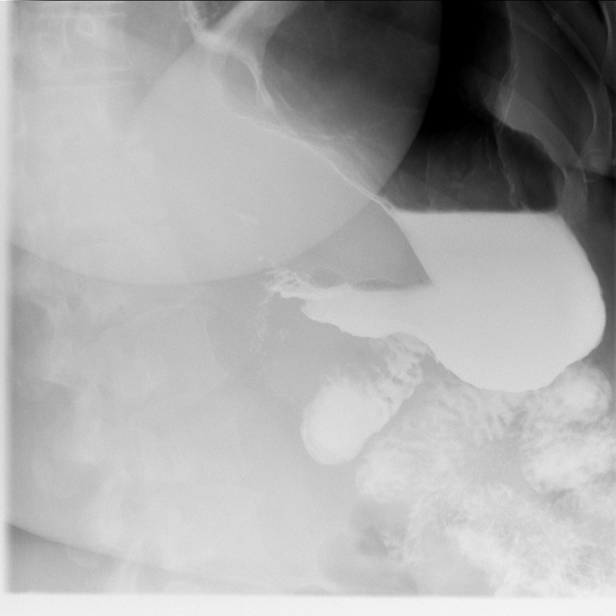

[15 of 24 positions shown; findings below may reference images not displayed]

PROCEDURE:     FL  - FL UPPER GI  - August 29, 2011  [DATE]

RESULT:     The patient easily ingested the liquid barium. The esophagus
distended normally. No esophageal or gastric mucosal abnormality was evident
during the exam. No gastroesophageal reflux was observed. There does appear
to be a minimal intermittent sliding-type hiatal hernia. Barium flows into a
normal-appearing small bowel.
IMPRESSION: Minimal intermittent sliding-type hiatal hernia. No
gastroesophageal reflux. No mucosal abnormality or other abnormality evident.

## 2012-08-01 IMAGING — RF DG UGI W/O KUB
3 series · 10 of 10 positions shown · non-contrast
Comparison: none

REASON FOR EXAM: post procedure gastric bypass evaluate for leak
COMMENTS:

PROCEDURE:     FL  - FL UPPER GI  - October 29, 2011  [DATE]
RESULT:     Comparison: Upper GI 08/29/2011
TECHNIQUE: Limited single contrast water-soluble upper GI was performed with
Gastrografin oral contrast material, with monitoring by intermittent
fluoroscopy.

[Series 1: ap · 0.17mm/px · 2 of 2 slices shown]
[im 1/2]
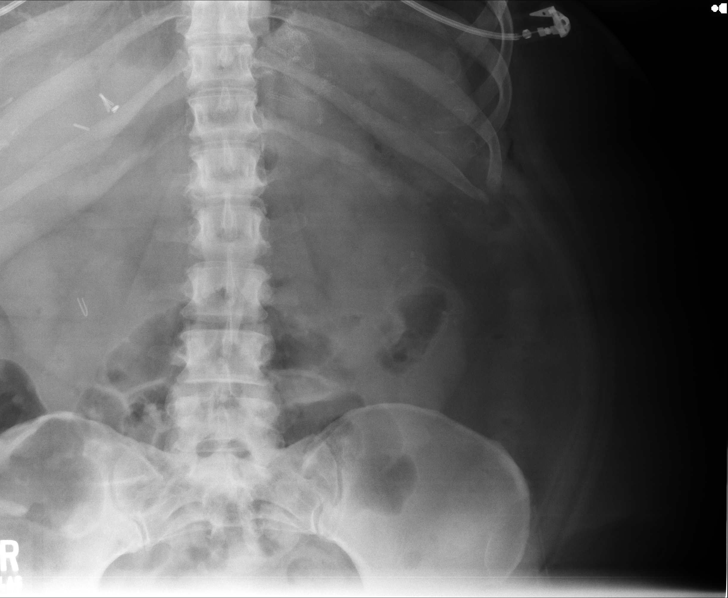
[im 2/2]
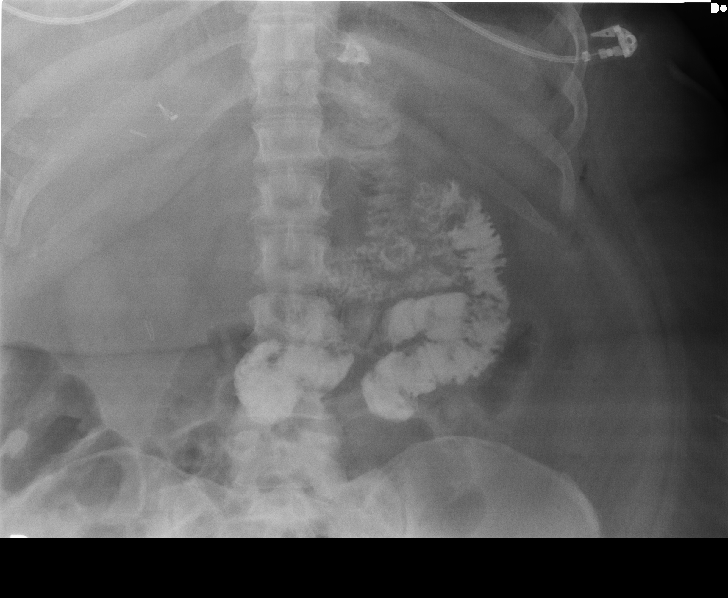

[Series 20: fluoro_barium 2fps_bw · 0.17mm/px · 4 of 9 frames shown (1 of 2)]
[frame 2/9]
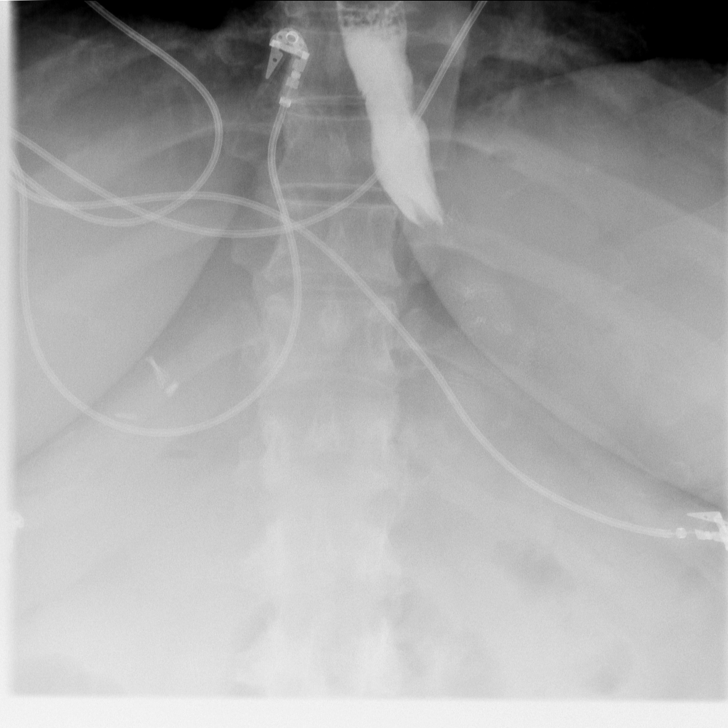
[frame 5/9]
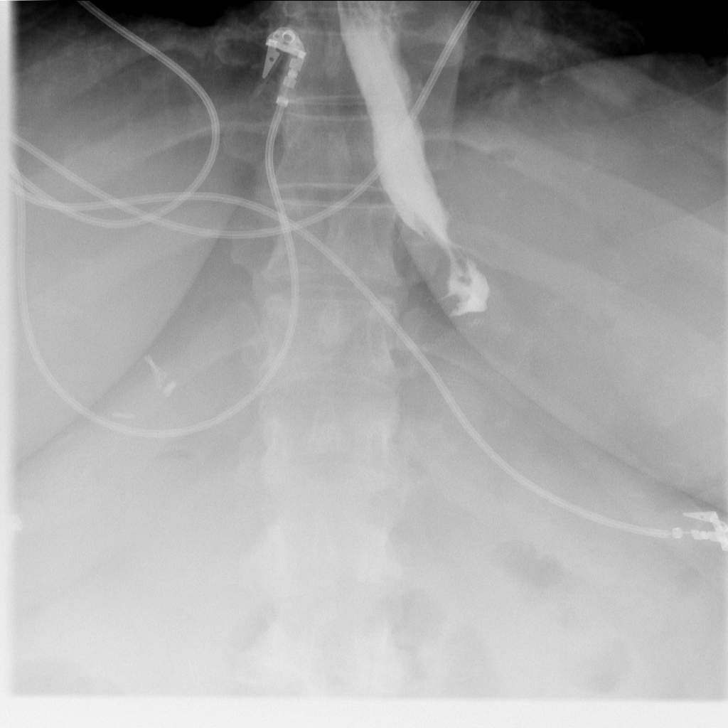
[frame 8/9]
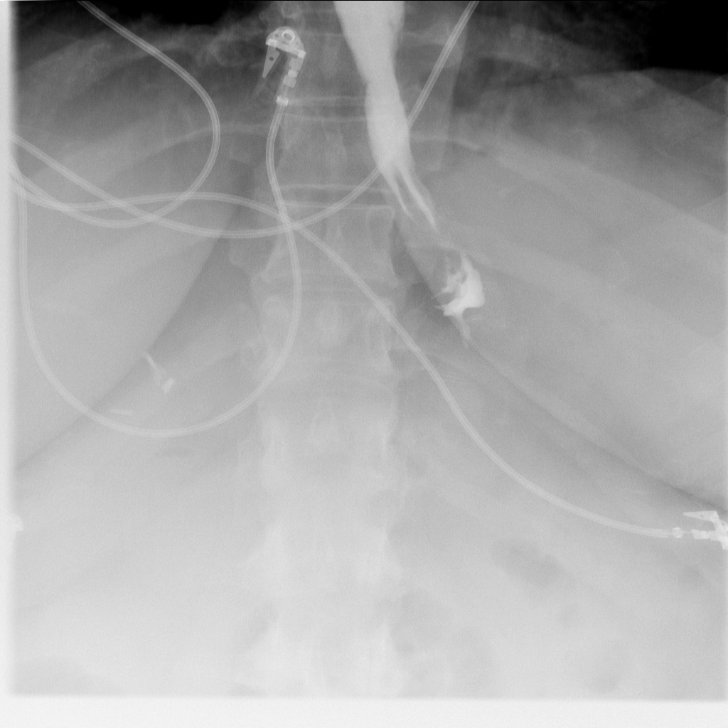
[frame 9/9]
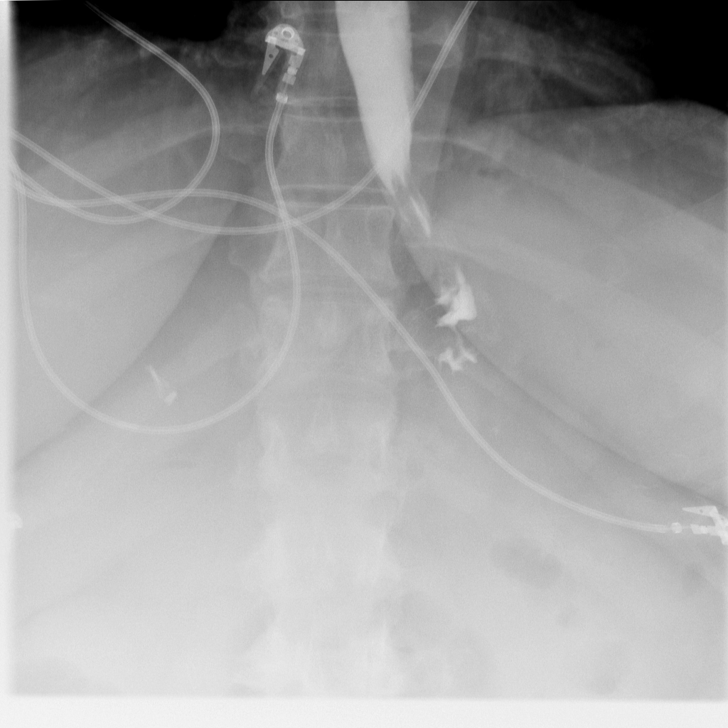

[Series 21: fluoro_barium 2fps_bw · 0.17mm/px · 4 of 8 frames shown (2 of 2)]
[frame 2/8]
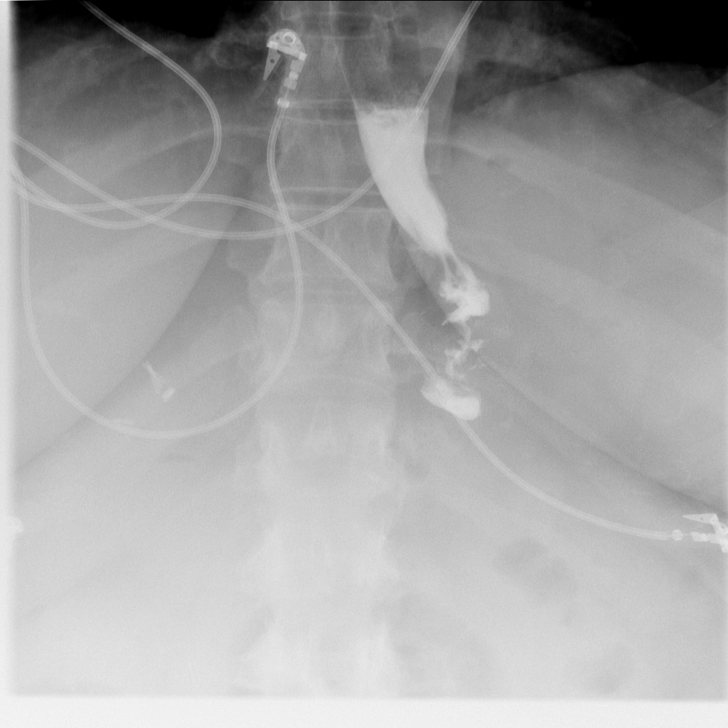
[frame 5/8]
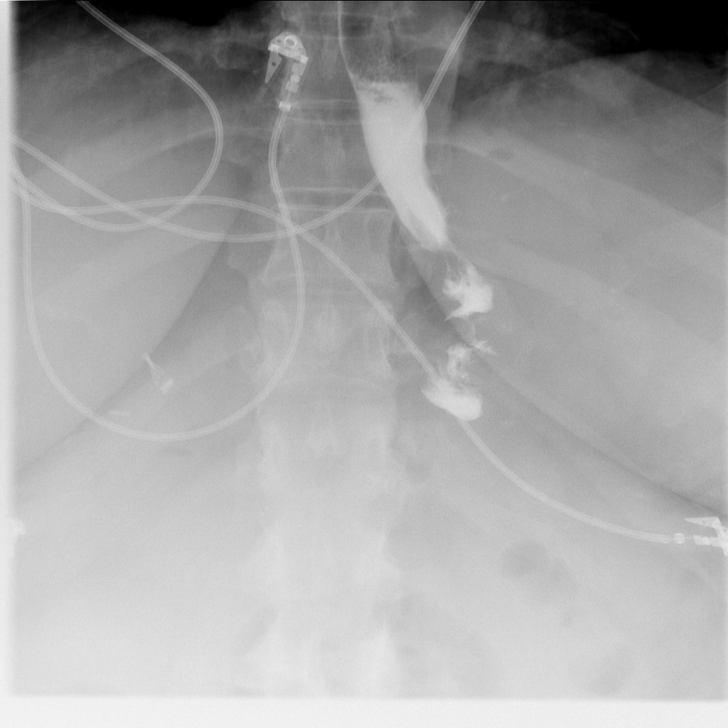
[frame 7/8]
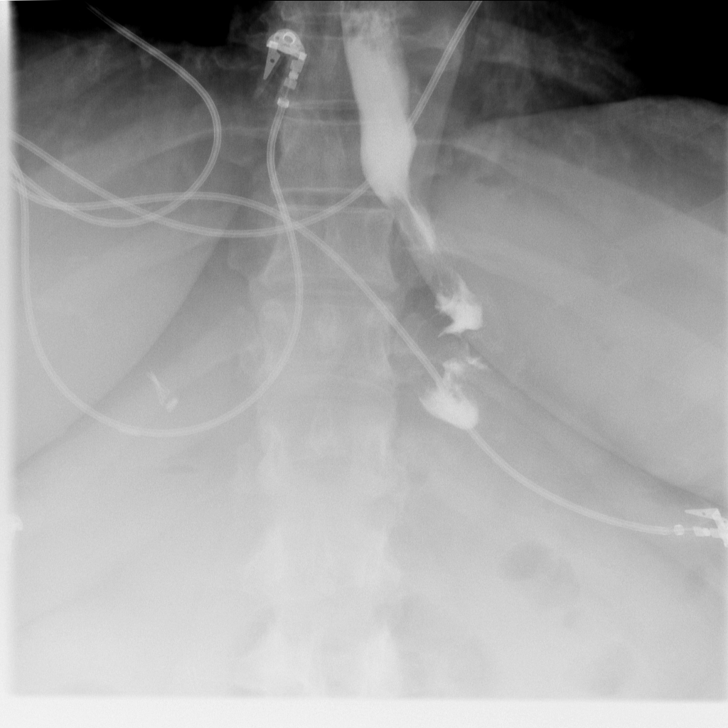
[frame 8/8]
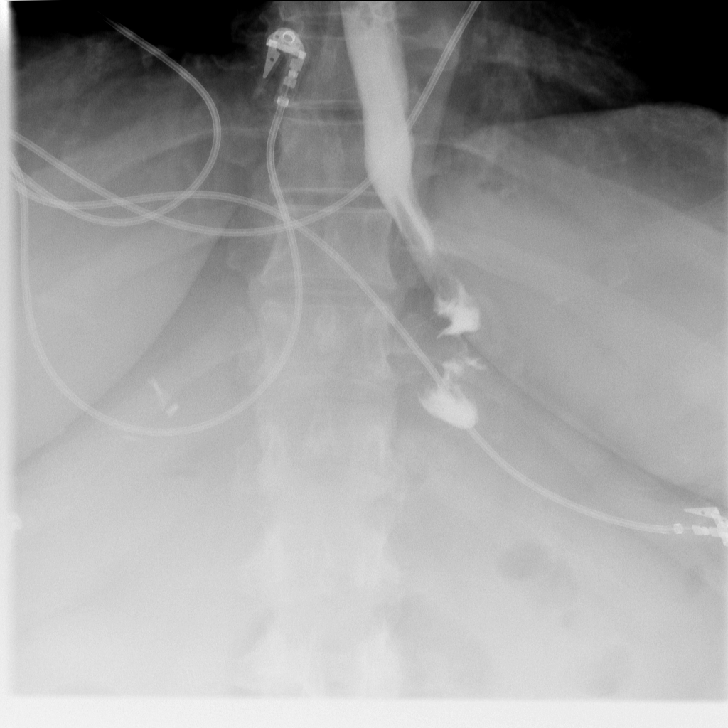

[10 of 10 positions shown; findings below may reference images not displayed]

FINDINGS: Initial scout radiograph demonstrates suture line in the left upper
quadrant. Oral contrast material passed freely through the distal esophagus,
into the gastric pouch, and into the jejunostomy. The gastric pouch was
somewhat small in caliber, possibly related to edema from postoperative
changes. No extravasation of oral contrast material is seen.
IMPRESSION: No evidence of leak, status post gastric bypass.

## 2013-05-19 ENCOUNTER — Ambulatory Visit: Payer: Self-pay | Admitting: Oncology

## 2013-06-18 ENCOUNTER — Ambulatory Visit: Payer: Self-pay | Admitting: Oncology

## 2013-07-19 ENCOUNTER — Ambulatory Visit: Payer: Self-pay | Admitting: Oncology

## 2013-08-18 ENCOUNTER — Ambulatory Visit: Payer: Self-pay | Admitting: Oncology

## 2013-10-17 ENCOUNTER — Ambulatory Visit: Payer: Self-pay | Admitting: Oncology

## 2013-11-16 ENCOUNTER — Ambulatory Visit: Payer: Self-pay | Admitting: Oncology

## 2014-02-24 ENCOUNTER — Ambulatory Visit: Payer: Self-pay | Admitting: Oncology

## 2014-03-18 ENCOUNTER — Ambulatory Visit: Payer: Self-pay | Admitting: Oncology

## 2014-06-29 ENCOUNTER — Ambulatory Visit: Payer: Self-pay | Admitting: Oncology

## 2014-07-18 ENCOUNTER — Ambulatory Visit: Payer: Self-pay | Admitting: Oncology

## 2014-11-01 ENCOUNTER — Ambulatory Visit
Admit: 2014-11-01 | Disposition: A | Discharge status: Home / Self Care | Payer: Self-pay | Attending: Oncology | Admitting: Oncology

## 2014-11-17 ENCOUNTER — Ambulatory Visit
Admit: 2014-11-17 | Disposition: A | Discharge status: Home / Self Care | Payer: Self-pay | Attending: Oncology | Admitting: Oncology

## 2014-12-10 NOTE — Op Note (Signed)
PATIENT NAME:  Ashlee Cruz, Ashlee Cruz MR#:  [id number] DATE OF BIRTH:  Nov 20, 1965  DATE OF PROCEDURE:  10/28/2011  PROCEDURE: Laparoscopic Roux-en-Y gastric bypass with repair of hiatal hernia.   PREOPERATIVE DIAGNOSIS: Morbid obesity with a BMI of 45 associated with hypertension, obstructive sleep apnea, and hyperlipidemia.  POSTOPERATIVE DIAGNOSIS: Morbid obesity with a BMI of 45 associated with hypertension, obstructive sleep apnea, and hyperlipidemia, presence of small hiatal hernia.  SURGEON:  Arletta Bale, MD   ASSISTANT: Darrin Luis, PA  PROCEDURE: The patient was brought to the operating room and placed in the supine position. General anesthesia was obtained with orotracheal intubation. The patient had placement of TED hose, Thrombo-guard foot board placed in the operative bed, and a Foley catheter inserted sterilely. The chest and abdomen were then sterilely prepped and draped. A 5-mm Optiview trocar was introduced in the left costal area under direct visualization. Pneumoperitoneum was obtained with carbon dioxide. Next a series of four additional trocars were introduced across the upper abdomen. The omentum was elevated out of the central lower abdomen, revealing the ligament of Treitz, which was followed distally 50 cm. A division of the small bowel was performed at this point with a white load Ethicon GIA stapler. The arcade vessels were divided by use of the Harmonic scalpel. The bowel was followed distally 100 cm at which point a side-to-side jejunal-jejunal anastomosis was configured. This was created with enterotomies on the antimesenteric border of the biliary limb and on the common channel portion of jejunum. A white load stapler was used to create a common channel followed by closure of the resulting enterotomy with a repeat firing of the white load GIA stapler. Anti-torsion sutures were placed distal in the anastomosis and the mesenteric window closed with a running 2-0 Ethibond suture. The  patient's transverse colon omentum was divided by use of the Harmonic scalpel creating increased access to the upper abdomen toward the mobilized Roux limb. The distal aspect of the Roux limb was secured to the anterior gastric wall for future use. The left lobe liver was elevated by way of a Nathanson liver retractor introduced through a subxiphoid wound. The hiatus was identified. There was noted to be a modest dental effect in the anterior aspect of the hiatus. Portions of the fat pad and peritoneal attachments of the fundus were taken down by use of the Harmonic scalpel, further defining a small hiatal hernia. The gastrohepatic ligament was divided and the herniated peritoneum at the anterior aspect of the hiatus was divided by use of the Harmonic scalpel. Blunt dissection in the lower mediastinum was used to mobilize the esophagus from the overlying pericardium. The patient had further mobilization of the esophagus away from the pleural surfaces on both the right and the left sides. The peritoneum overlying the right crus was incised by use of the Harmonic scalpel and blunt dissection was used to reduce the herniated lesser sac fatty tissue. Phrenoesophageal ligaments were divided on the left crus as well. Further circumferential mobilization of the esophagus was performed, eventually delivering 2 cm of esophagus lying comfortably in the abdominal cavity. Two interrupted 0 Ethibond sutures were used to approximate the crural musculature posteriorly. Next, 5 cm inferior to the GE junction the lesser curvature arcade vessels were divided by use of the Harmonic scalpel. A transverse firing of a gold load stapler was used to initiate creation of a proximal gastric pouch. Two staples were placed in a vertical line parallel to the lesser curvature and brought out just lateral  to the angle of His, completing creation of a proximal gastric pouch. A distal posterior gastrojejunal anastomosis was then created by  enterotomies on the distal posterior aspect of the stomach and the antimesenteric border of the mobilized Roux limb. A blue load stapler was fired at approximately the 2-cm mark to create the beginning of the common lumen. The resulting enterotomy was closed with a running 2-0 Vicryl suture while a 34 French bougie was in place. These second layer of sutures was used to reinforce the initial suture and staple line. At this point the bougie was withdrawn and intraoperative endoscopy performed. There was no evidence of an air leak identified in the area of the anastomosis. The scope was withdrawn at this point and the patient had an omental patch placed over the area of anastomosis and secured with a 2-0 Ethibond suture. The patient had closure of the [place] defect with a running 2-0 Ethibond suture. At this point the regions of the surgical dissection were explored and no evidence of bleeding identified. The pneumoperitoneum was relieved. The trocars were removed and the wounds were injected with 0.25% Marcaine followed by 4-0 Monocryl in the dermis followed by Dermabond. The patient was allowed to recover at this point having tolerated the procedure well with minimal blood loss.    ____________________________ Venia Carbon Duke Salvia, MD mat:bjt D: 11/03/2011 07:48:08 ET T: 11/03/2011 09:30:12 ET JOB#: [id number]  cc: Legrand Como A. Duke Salvia, MD, <Dictator> Dwayne D. Clayborn Bigness, MD Bethena Roys. Ancil Boozer, MD Ladora Daniel MD ELECTRONICALLY SIGNED 11/11/2011 9:42

## 2015-01-21 ENCOUNTER — Other Ambulatory Visit: Payer: Self-pay | Admitting: Oncology

## 2015-01-21 DIAGNOSIS — D509 Iron deficiency anemia, unspecified: Secondary | ICD-10-CM

## 2015-01-22 ENCOUNTER — Ambulatory Visit: Payer: Self-pay

## 2015-01-22 ENCOUNTER — Ambulatory Visit: Payer: Self-pay | Admitting: Oncology

## 2015-02-16 ENCOUNTER — Encounter: Payer: Self-pay | Admitting: Family Medicine

## 2015-03-30 ENCOUNTER — Other Ambulatory Visit: Payer: Self-pay | Admitting: Family Medicine

## 2015-04-26 ENCOUNTER — Ambulatory Visit (INDEPENDENT_AMBULATORY_CARE_PROVIDER_SITE_OTHER): Payer: 59 | Admitting: Family Medicine

## 2015-04-26 ENCOUNTER — Encounter: Payer: Self-pay | Admitting: Family Medicine

## 2015-04-26 VITALS — BP 116/82 | HR 96 | Temp 98.4°F | Resp 14 | Ht 61.0 in | Wt 160.4 lb

## 2015-04-26 DIAGNOSIS — Z9884 Bariatric surgery status: Secondary | ICD-10-CM | POA: Insufficient documentation

## 2015-04-26 DIAGNOSIS — E8881 Metabolic syndrome: Secondary | ICD-10-CM | POA: Insufficient documentation

## 2015-04-26 DIAGNOSIS — Z8679 Personal history of other diseases of the circulatory system: Secondary | ICD-10-CM | POA: Insufficient documentation

## 2015-04-26 DIAGNOSIS — Z23 Encounter for immunization: Secondary | ICD-10-CM

## 2015-04-26 DIAGNOSIS — J302 Other seasonal allergic rhinitis: Secondary | ICD-10-CM | POA: Insufficient documentation

## 2015-04-26 DIAGNOSIS — E785 Hyperlipidemia, unspecified: Secondary | ICD-10-CM | POA: Insufficient documentation

## 2015-04-26 DIAGNOSIS — M5442 Lumbago with sciatica, left side: Secondary | ICD-10-CM

## 2015-04-26 DIAGNOSIS — E669 Obesity, unspecified: Secondary | ICD-10-CM | POA: Insufficient documentation

## 2015-04-26 DIAGNOSIS — N912 Amenorrhea, unspecified: Secondary | ICD-10-CM | POA: Insufficient documentation

## 2015-04-26 DIAGNOSIS — M25562 Pain in left knee: Secondary | ICD-10-CM

## 2015-04-26 DIAGNOSIS — F330 Major depressive disorder, recurrent, mild: Secondary | ICD-10-CM | POA: Insufficient documentation

## 2015-04-26 DIAGNOSIS — E538 Deficiency of other specified B group vitamins: Secondary | ICD-10-CM | POA: Insufficient documentation

## 2015-04-26 DIAGNOSIS — E559 Vitamin D deficiency, unspecified: Secondary | ICD-10-CM | POA: Insufficient documentation

## 2015-04-26 DIAGNOSIS — L409 Psoriasis, unspecified: Secondary | ICD-10-CM | POA: Insufficient documentation

## 2015-04-26 DIAGNOSIS — E611 Iron deficiency: Secondary | ICD-10-CM | POA: Insufficient documentation

## 2015-04-26 DIAGNOSIS — M25561 Pain in right knee: Secondary | ICD-10-CM | POA: Insufficient documentation

## 2015-04-26 MED ORDER — HYDROCODONE-ACETAMINOPHEN 10-325 MG PO TABS: 1.0000 | ORAL_TABLET | Freq: Four times a day (QID) | ORAL | Status: DC | PRN

## 2015-04-26 MED ORDER — PREDNISONE 10 MG (48) PO TBPK: ORAL_TABLET | Freq: Every day | ORAL | Status: DC

## 2015-04-26 MED ORDER — CYCLOBENZAPRINE HCL 10 MG PO TABS: 10.0000 mg | ORAL_TABLET | Freq: Three times a day (TID) | ORAL | Status: DC | PRN

## 2015-04-26 NOTE — Progress Notes (Signed)
Name: Ashlee Cruz   MRN: [id number]    DOB: [birth year]   Date:04/26/2015       Progress Note  Subjective  Chief Complaint  Chief Complaint  Patient presents with  . Back Pain    onset 4 days ago getting worse,slept on couch. Left side of low back radiating down buttocks    HPI  Sciatica : patient went to visit her mother and slept in the cough, woke up with pain on the left lower back radiating to left buttocks and upper thigh.  She denies bowel or bladder incontinence, or paresthesia. She is having severe pain with rom and walking. Needs to change position frequently to feel comfortable.  Patient Active Problem List   Diagnosis Date Noted  . Absence of menstruation 04/26/2015  . B12 deficiency 04/26/2015  . Clinical depression 04/26/2015  . Dyslipidemia 04/26/2015  . Dysmetabolic syndrome 40/05/2724  . Bariatric surgery status 04/26/2015  . H/O: HTN (hypertension) 04/26/2015  . Iron deficiency 04/26/2015  . Obesity (BMI 30.0-34.9) 04/26/2015  . Psoriasis 04/26/2015  . Allergic rhinitis, seasonal 04/26/2015  . Vitamin D deficiency 04/26/2015  . Bilateral knee pain 04/26/2015    History reviewed. No pertinent past surgical history.  History reviewed. No pertinent family history.  Social History   Social History  . Marital Status: Married    Spouse Name: N/A  . Number of Children: N/A  . Years of Education: N/A   Occupational History  . Not on file.   Social History Main Topics  . Smoking status: Never Smoker   . Smokeless tobacco: Never Used  . Alcohol Use: No  . Drug Use: No  . Sexual Activity: Yes   Other Topics Concern  . Not on file   Social History Narrative  . No narrative on file     Current outpatient prescriptions:  .  Apremilast (OTEZLA) 30 MG TABS, Take 1 tablet by mouth 2 (two) times daily., Disp: , Rfl:  .  diphenhydrAMINE (BENADRYL ALLERGY) 25 MG tablet, Take 1 tablet by mouth as needed., Disp: , Rfl:  .  escitalopram (LEXAPRO) 20 MG  tablet, Take 1 tablet by mouth  daily for depression, Disp: 90 tablet, Rfl: 0 .  cyclobenzaprine (FLEXERIL) 10 MG tablet, Take 1 tablet (10 mg total) by mouth 3 (three) times daily as needed for muscle spasms., Disp: 30 tablet, Rfl: 0 .  HYDROcodone-acetaminophen (NORCO) 10-325 MG per tablet, Take 1 tablet by mouth every 6 (six) hours as needed., Disp: 30 tablet, Rfl: 0 .  predniSONE (STERAPRED UNI-PAK 48 TAB) 10 MG (48) TBPK tablet, Take by mouth daily., Disp: 48 tablet, Rfl: 0  Allergies  Allergen Reactions  . Codeine   . Latex   . Bacitracin-Polymyxin B Rash    redness if left on for long periods   states no to latex   use paper tape please     ROS  Constitutional: Negative for fever or weight change.  Respiratory: Negative for cough and shortness of breath.   Cardiovascular: Negative for chest pain or palpitations.  Gastrointestinal: Negative for abdominal pain, no bowel changes.  Musculoskeletal: Positive  for gait difficulty secondary to back pain, no joint swelling.  Skin: Negative for rash.  Neurological: Negative for dizziness or headache.  No other specific complaints in a complete review of systems (except as listed in HPI above).  Objective  Filed Vitals:   04/26/15 1123  BP: 116/82  Pulse: 96  Temp: 98.4 F (36.9 C)  TempSrc: Oral  Resp: 14  Height: 5\' 1"  (1.549 m)  Weight: 160 lb 6.4 oz (72.757 kg)  SpO2: 97%    Body mass index is 30.32 kg/(m^2).  Physical Exam  Constitutional: Patient appears well-developed and well-nourished. Obese  No distress.  HEENT: head atraumatic, normocephalic, pupils equal and reactive to light,  neck supple, throat within normal limits Cardiovascular: Normal rate, regular rhythm and normal heart sounds.  No murmur heard. No BLE edema. Pulmonary/Chest: Effort normal and breath sounds normal. No respiratory distress. Abdominal: Soft.  There is no tenderness. Psychiatric: Patient has a normal mood and affect. behavior is normal.  Judgment and thought content normal. Muscular Skeletal: pain during palpation of left lower back and sciatic notch, negative straight leg raise, severe pain with left lateral bending and extension, also decrease in flexion and right lateral bending. No rashes on her back  Neuro: normal sensation and patellar reflexes   PHQ2/9: Depression screen PHQ 2/9 04/26/2015  Decreased Interest 0  Down, Depressed, Hopeless 0  PHQ - 2 Score 0    Fall Risk: Fall Risk  04/26/2015  Falls in the past year? No      Functional Status Survey: Is the patient deaf or have difficulty hearing?: No Does the patient have difficulty seeing, even when wearing glasses/contacts?: No Does the patient have difficulty concentrating, remembering, or making decisions?: No Does the patient have difficulty walking or climbing stairs?: No Does the patient have difficulty dressing or bathing?: No Does the patient have difficulty doing errands alone such as visiting a doctor's office or shopping?: No    Assessment & Plan  1. Left-sided low back pain with left-sided sciatica Discussed importance of not driving while taking muscle relaxer or hydrocodone, discussed need to go to [facility] if bowel or bladder incontence - HYDROcodone-acetaminophen (NORCO) 10-325 MG per tablet; Take 1 tablet by mouth every 6 (six) hours as needed.  Dispense: 30 tablet; Refill: 0 - predniSONE (STERAPRED UNI-PAK 48 TAB) 10 MG (48) TBPK tablet; Take by mouth daily.  Dispense: 48 tablet; Refill: 0 - cyclobenzaprine (FLEXERIL) 10 MG tablet; Take 1 tablet (10 mg total) by mouth 3 (three) times daily as needed for muscle spasms.  Dispense: 30 tablet; Refill: 0  2. Needs flu shot  - Flu Vaccine QUAD 36+ mos PF IM (Fluarix & Fluzone Quad PF)

## 2015-06-01 ENCOUNTER — Ambulatory Visit (INDEPENDENT_AMBULATORY_CARE_PROVIDER_SITE_OTHER): Payer: 59 | Admitting: Family Medicine

## 2015-06-01 ENCOUNTER — Encounter: Payer: Self-pay | Admitting: Family Medicine

## 2015-06-01 ENCOUNTER — Encounter (INDEPENDENT_AMBULATORY_CARE_PROVIDER_SITE_OTHER): Payer: Self-pay

## 2015-06-01 VITALS — BP 118/76 | HR 98 | Temp 98.7°F | Resp 16 | Ht 61.0 in | Wt 162.7 lb

## 2015-06-01 DIAGNOSIS — E538 Deficiency of other specified B group vitamins: Secondary | ICD-10-CM

## 2015-06-01 DIAGNOSIS — Z131 Encounter for screening for diabetes mellitus: Secondary | ICD-10-CM

## 2015-06-01 DIAGNOSIS — Z9884 Bariatric surgery status: Secondary | ICD-10-CM

## 2015-06-01 DIAGNOSIS — M5442 Lumbago with sciatica, left side: Secondary | ICD-10-CM

## 2015-06-01 DIAGNOSIS — Z79899 Other long term (current) drug therapy: Secondary | ICD-10-CM | POA: Diagnosis not present

## 2015-06-01 DIAGNOSIS — F330 Major depressive disorder, recurrent, mild: Secondary | ICD-10-CM

## 2015-06-01 DIAGNOSIS — E785 Hyperlipidemia, unspecified: Secondary | ICD-10-CM

## 2015-06-01 DIAGNOSIS — E611 Iron deficiency: Secondary | ICD-10-CM

## 2015-06-01 DIAGNOSIS — E559 Vitamin D deficiency, unspecified: Secondary | ICD-10-CM | POA: Diagnosis not present

## 2015-06-01 DIAGNOSIS — R5383 Other fatigue: Secondary | ICD-10-CM | POA: Diagnosis not present

## 2015-06-01 LAB — CBC WITH DIFFERENTIAL/PLATELET
BASOS ABS: 0 10*3/uL (ref 0.0–0.2)
Basos: 1 %
EOS (ABSOLUTE): 0.2 10*3/uL (ref 0.0–0.4)
Eos: 4 %
HEMOGLOBIN: 14.9 g/dL (ref 11.1–15.9)
Hematocrit: 41.8 % (ref 34.0–46.6)
IMMATURE GRANS (ABS): 0 10*3/uL (ref 0.0–0.1)
IMMATURE GRANULOCYTES: 0 %
LYMPHS: 33 %
Lymphocytes Absolute: 1.4 10*3/uL (ref 0.7–3.1)
MCH: 31.9 pg (ref 26.6–33.0)
MCHC: 35.6 g/dL (ref 31.5–35.7)
MCV: 90 fL (ref 79–97)
MONOCYTES: 12 %
Monocytes Absolute: 0.5 10*3/uL (ref 0.1–0.9)
NEUTROS PCT: 50 %
Neutrophils Absolute: 2.2 10*3/uL (ref 1.4–7.0)
PLATELETS: 311 10*3/uL (ref 150–379)
RBC: 4.67 x10E6/uL (ref 3.77–5.28)
RDW: 12.3 % (ref 12.3–15.4)
WBC: 4.4 10*3/uL (ref 3.4–10.8)

## 2015-06-01 MED ORDER — ESCITALOPRAM OXALATE 20 MG PO TABS: 20.0000 mg | ORAL_TABLET | Freq: Every day | ORAL | Status: DC

## 2015-06-01 MED ORDER — CYCLOBENZAPRINE HCL 5 MG PO TABS: 5.0000 mg | ORAL_TABLET | Freq: Three times a day (TID) | ORAL | Status: DC | PRN

## 2015-06-01 MED ORDER — HYDROCODONE-ACETAMINOPHEN 10-325 MG PO TABS: 1.0000 | ORAL_TABLET | Freq: Four times a day (QID) | ORAL | Status: DC | PRN

## 2015-06-01 NOTE — Progress Notes (Signed)
Name: Ashlee Cruz   MRN: [id number]    DOB: 1966-04-22   Date:06/01/2015       Progress Note  Subjective  Chief Complaint  Chief Complaint  Patient presents with  . Depression    medication refills  . Back Pain    follow up    HPI  Major Depression Mild Recurrent: taking Lexapro for years now, she is doing well. She had some increase in sadness this month because of her father's birthday ( he died Sep 11, 2014 ), also anniversary of death of her miscarriage this month.  She is grieves. She has fatigue, very seldom has a crying spells, she has occasional anhedonia, she denies problems with her focus or ability to work. No change in appetite.   Bariatric Surgery: she stopped taking all supplements, she has a history of B12, D and iron deficiency anemia. She missed appointment Dr. Grayland Ormond and is due for follow up.   Sciatica pain left: she was in severe pain when she was seen one month ago.  Doing much better now, but still has intermittent pain and spasms from left low back to left sciatic notch. She drives a lot for work and states worse when she sits for a prolonged period of time.    Patient Active Problem List   Diagnosis Date Noted  . Absence of menstruation 04/26/2015  . B12 deficiency 04/26/2015  . Depression, major, recurrent, mild ([place]) 04/26/2015  . Dyslipidemia 04/26/2015  . Dysmetabolic syndrome 51/76/1607  . Bariatric surgery status 04/26/2015  . H/O: HTN (hypertension) 04/26/2015  . Iron deficiency 04/26/2015  . Obesity (BMI 30.0-34.9) 04/26/2015  . Psoriasis 04/26/2015  . Allergic rhinitis, seasonal 04/26/2015  . Vitamin D deficiency 04/26/2015  . Bilateral knee pain 04/26/2015    Past Surgical History  Procedure Laterality Date  . Bariatric surgery      History reviewed. No pertinent family history.  Social History   Social History  . Marital Status: Married    Spouse Name: N/A  . Number of Children: N/A  . Years of Education: N/A   Occupational  History  . Not on file.   Social History Main Topics  . Smoking status: Never Smoker   . Smokeless tobacco: Never Used  . Alcohol Use: No  . Drug Use: No  . Sexual Activity: Yes   Other Topics Concern  . Not on file   Social History Narrative     Current outpatient prescriptions:  .  Apremilast (OTEZLA) 30 MG TABS, Take 1 tablet by mouth 2 (two) times daily., Disp: , Rfl:  .  cyclobenzaprine (FLEXERIL) 5 MG tablet, Take 1 tablet (5 mg total) by mouth 3 (three) times daily as needed for muscle spasms., Disp: 90 tablet, Rfl: 0 .  diphenhydrAMINE (BENADRYL ALLERGY) 25 MG tablet, Take 1 tablet by mouth as needed., Disp: , Rfl:  .  escitalopram (LEXAPRO) 20 MG tablet, Take 1 tablet (20 mg total) by mouth daily., Disp: 90 tablet, Rfl: 1 .  HYDROcodone-acetaminophen (NORCO) 10-325 MG tablet, Take 1 tablet by mouth every 6 (six) hours as needed., Disp: 20 tablet, Rfl: 0  Allergies  Allergen Reactions  . Codeine   . Latex   . Bacitracin-Polymyxin B Rash    redness if left on for long periods   states no to latex   use paper tape please     ROS  Constitutional: Negative for fever or weight change.  Respiratory: Negative for cough and shortness of breath.   Cardiovascular:  Negative for chest pain or palpitations.  Gastrointestinal: Negative for abdominal pain, no bowel changes.  Musculoskeletal: Negative for gait problem or joint swelling.  Skin: Psoriasis is under control  Neurological: Negative for dizziness or headache.  No other specific complaints in a complete review of systems (except as listed in HPI above).   Objective  Filed Vitals:   06/01/15 0809  BP: 118/76  Pulse: 98  Temp: 98.7 F (37.1 C)  TempSrc: Oral  Resp: 16  Height: 5\' 1"  (1.549 m)  Weight: 162 lb 11.2 oz (73.8 kg)  SpO2: 96%    Body mass index is 30.76 kg/(m^2).  Physical Exam  Constitutional: Patient appears well-developed and well-nourished. Obese No distress.  HEENT: head atraumatic,  normocephalic, pupils equal and reactive to light, neck supple, throat within normal limits Cardiovascular: Normal rate, regular rhythm and normal heart sounds.  No murmur heard. No BLE edema. Pulmonary/Chest: Effort normal and breath sounds normal. No respiratory distress. Abdominal: Soft.  There is no tenderness. Psychiatric: Patient has a normal mood and affect. behavior is normal. Judgment and thought content normal. Muscular Skeletal: Pain during palpation of left lower back and sciatic notch on left side - negative straight leg raise, normal rom lumbar spine   PHQ2/9: Depression screen [facility] 2/9 06/01/2015 04/26/2015  Decreased Interest 0 0  Down, Depressed, Hopeless 0 0  PHQ - 2 Score 0 0    Fall Risk: Fall Risk  06/01/2015 04/26/2015  Falls in the past year? No No    Functional Status Survey: Is the patient deaf or have difficulty hearing?: No Does the patient have difficulty seeing, even when wearing glasses/contacts?: No Does the patient have difficulty concentrating, remembering, or making decisions?: No Does the patient have difficulty walking or climbing stairs?: No Does the patient have difficulty dressing or bathing?: No Does the patient have difficulty doing errands alone such as visiting a doctor's office or shopping?: No   Assessment & Plan  1. Depression, major, recurrent, mild ([facility])  - escitalopram (LEXAPRO) 20 MG tablet; Take 1 tablet (20 mg total) by mouth daily.  Dispense: 90 tablet; Refill: 1 She is feeling well, but we will hold off weaning her off to a lower dose until Spring   2. Dyslipidemia  - Lipid panel  3. B12 deficiency  - Vitamin B12  4. Bariatric surgery status  Needs to resume supplementation  5. Iron deficiency  - CBC with Differential/Platelet - Ferritin  6. Vitamin D deficiency  - Vit D  25 hydroxy (rtn osteoporosis monitoring)  7. Left-sided low back pain with left-sided sciatica  Still having pain, we discussed PT versus  Chiropractor and she chose the later. She can't take NSAID's because of bariatric surgery. She may also have a ischium bursitis - cyclobenzaprine (FLEXERIL) 5 MG tablet; Take 1 tablet (5 mg total) by mouth 3 (three) times daily as needed for muscle spasms.  Dispense: 90 tablet; Refill: 0 - HYDROcodone-acetaminophen (NORCO) 10-325 MG tablet; Take 1 tablet by mouth every 6 (six) hours as needed.  Dispense: 20 tablet; Refill: 0 - Ambulatory referral to Chiropractic - [facility]  8. Diabetes mellitus screening  - Hemoglobin A1c  9. Long-term use of high-risk medication  - Comprehensive metabolic panel  10. Other fatigue  - TSH

## 2015-06-02 LAB — COMPREHENSIVE METABOLIC PANEL
ALT: 35 IU/L — AB (ref 0–32)
AST: 24 IU/L (ref 0–40)
Albumin/Globulin Ratio: 2.3 (ref 1.1–2.5)
Albumin: 4.6 g/dL (ref 3.5–5.5)
Alkaline Phosphatase: 92 IU/L (ref 39–117)
BUN/Creatinine Ratio: 15 (ref 9–23)
BUN: 11 mg/dL (ref 6–24)
Bilirubin Total: 1.1 mg/dL (ref 0.0–1.2)
CALCIUM: 9.8 mg/dL (ref 8.7–10.2)
CO2: 26 mmol/L (ref 18–29)
Chloride: 100 mmol/L (ref 97–108)
Creatinine, Ser: 0.74 mg/dL (ref 0.57–1.00)
GFR, EST AFRICAN AMERICAN: 111 mL/min/{1.73_m2} (ref >59)
GFR, EST NON AFRICAN AMERICAN: 96 mL/min/{1.73_m2} (ref >59)
GLOBULIN, TOTAL: 2 g/dL (ref 1.5–4.5)
GLUCOSE: 81 mg/dL (ref 65–99)
POTASSIUM: 4.2 mmol/L (ref 3.5–5.2)
Sodium: 143 mmol/L (ref 134–144)
Total Protein: 6.6 g/dL (ref 6.0–8.5)

## 2015-06-02 LAB — VITAMIN B12: Vitamin B-12: 277 pg/mL (ref 211–946)

## 2015-06-02 LAB — LIPID PANEL
Chol/HDL Ratio: 2.3 ratio units (ref 0.0–4.4)
Cholesterol, Total: 234 mg/dL — ABNORMAL HIGH (ref 100–199)
HDL: 101 mg/dL (ref >39)
LDL CALC: 120 mg/dL — AB (ref 0–99)
Triglycerides: 63 mg/dL (ref 0–149)

## 2015-06-02 LAB — VITAMIN D 25 HYDROXY (VIT D DEFICIENCY, FRACTURES): VIT D 25 HYDROXY: 20.3 ng/mL — AB (ref 30.0–100.0)

## 2015-06-02 LAB — TSH: TSH: 1.81 u[IU]/mL (ref 0.450–4.500)

## 2015-06-02 LAB — HEMOGLOBIN A1C: Hgb A1c MFr Bld: 5.8 % — ABNORMAL HIGH (ref 4.8–5.6)

## 2015-06-02 LAB — FERRITIN: Ferritin: 43 ng/mL (ref 15–150)

## 2015-09-07 ENCOUNTER — Encounter: Payer: 59 | Admitting: Family Medicine

## 2015-09-30 ENCOUNTER — Other Ambulatory Visit: Payer: Self-pay | Admitting: Family Medicine

## 2015-10-23 ENCOUNTER — Encounter: Payer: Self-pay | Admitting: Family Medicine

## 2015-10-23 ENCOUNTER — Ambulatory Visit: Payer: 59 | Admitting: Family Medicine

## 2015-10-23 ENCOUNTER — Ambulatory Visit (INDEPENDENT_AMBULATORY_CARE_PROVIDER_SITE_OTHER): Payer: 59 | Admitting: Family Medicine

## 2015-10-23 ENCOUNTER — Other Ambulatory Visit: Payer: Self-pay

## 2015-10-23 ENCOUNTER — Other Ambulatory Visit: Payer: Self-pay | Admitting: Family Medicine

## 2015-10-23 VITALS — BP 110/60 | HR 105 | Temp 97.7°F | Resp 18 | Ht 61.0 in | Wt 167.7 lb

## 2015-10-23 DIAGNOSIS — M5416 Radiculopathy, lumbar region: Secondary | ICD-10-CM | POA: Diagnosis not present

## 2015-10-23 DIAGNOSIS — E538 Deficiency of other specified B group vitamins: Secondary | ICD-10-CM | POA: Diagnosis not present

## 2015-10-23 DIAGNOSIS — N950 Postmenopausal bleeding: Secondary | ICD-10-CM

## 2015-10-23 DIAGNOSIS — Z9884 Bariatric surgery status: Secondary | ICD-10-CM

## 2015-10-23 DIAGNOSIS — E785 Hyperlipidemia, unspecified: Secondary | ICD-10-CM

## 2015-10-23 DIAGNOSIS — Z862 Personal history of diseases of the blood and blood-forming organs and certain disorders involving the immune mechanism: Secondary | ICD-10-CM | POA: Insufficient documentation

## 2015-10-23 DIAGNOSIS — M5442 Lumbago with sciatica, left side: Secondary | ICD-10-CM

## 2015-10-23 DIAGNOSIS — E559 Vitamin D deficiency, unspecified: Secondary | ICD-10-CM | POA: Diagnosis not present

## 2015-10-23 DIAGNOSIS — E8881 Metabolic syndrome: Secondary | ICD-10-CM

## 2015-10-23 MED ORDER — PREDNISONE 10 MG (48) PO TBPK: ORAL_TABLET | Freq: Every day | ORAL | Status: DC

## 2015-10-23 MED ORDER — CYCLOBENZAPRINE HCL 5 MG PO TABS: 5.0000 mg | ORAL_TABLET | Freq: Three times a day (TID) | ORAL | Status: DC | PRN

## 2015-10-23 MED ORDER — HYDROCODONE-ACETAMINOPHEN 10-325 MG PO TABS: 1.0000 | ORAL_TABLET | Freq: Four times a day (QID) | ORAL | Status: DC | PRN

## 2015-10-23 NOTE — Progress Notes (Signed)
Name: Ashlee Cruz   MRN: [id number]    DOB: 08/01/1966   Date:10/23/2015       Progress Note  Subjective  Chief Complaint  Chief Complaint  Patient presents with  . Menstrual Problem    patient had not had a period for over a year and a half but then it came on 10/18/15.  . Tingling    radiates down her left leg to her toes.  . Back Pain    patient sees a chiropractor and thinks her tingling is coming from her back.    HPI  Post-menopausal bleeding: she stopped having cycle for about  18 months, but started to bleed one week ago ( 10/17/2015) it was very heavy for two days but medium flow since. She also had the other symptoms that she has during her cycle, such as cramping, leg aching.   Lumbar Radiculitis: she was seen 04/2015 and was given prednisone taper and radiculitis resolved, but she continued to have low back pain - but not as severe, she has been seeing Dr. Freddi Che (chiropractor ) and was doing well, but over the few days she has noticed tingling sensation going down left lateral leg all the way down to all her toes.   History of Iron Deficiency anemia: used to have iron infusion but was doing well and last labs were normal ( Fall 2016) No SOB or fatigue, no pica.   Major Depression Mild Recurrent: taking Lexapro for years now, she states she recently felt a little sad worried about her heath.. She has fatigue, very seldom has a crying spells, she has occasional anhedonia, she denies problems with her focus or ability to work. No change in appetite.    Bariatric Surgery: she stopped taking all supplements, she has a history of B12, D and iron deficiency anemia.   Patient Active Problem List   Diagnosis Date Noted  . History of iron deficiency anemia 10/23/2015  . Left lumbar radiculitis 10/23/2015  . Absence of menstruation 04/26/2015  . B12 deficiency 04/26/2015  . Depression, major, recurrent, mild ([place]) 04/26/2015  . Dyslipidemia 04/26/2015  . Dysmetabolic syndrome  [id number]  . Bariatric surgery status 04/26/2015  . H/O: HTN (hypertension) 04/26/2015  . Obesity (BMI 30.0-34.9) 04/26/2015  . Psoriasis 04/26/2015  . Allergic rhinitis, seasonal 04/26/2015  . Vitamin D deficiency 04/26/2015  . Bilateral knee pain 04/26/2015    Past Surgical History  Procedure Laterality Date  . Bariatric surgery      History reviewed. No pertinent family history.  Social History   Social History  . Marital Status: Married    Spouse Name: N/A  . Number of Children: N/A  . Years of Education: N/A   Occupational History  . Not on file.   Social History Main Topics  . Smoking status: Never Smoker   . Smokeless tobacco: Never Used  . Alcohol Use: No  . Drug Use: No  . Sexual Activity: Yes   Other Topics Concern  . Not on file   Social History Narrative     Current outpatient prescriptions:  .  Apremilast (OTEZLA) 30 MG TABS, Take 1 tablet by mouth 2 (two) times daily. For psoriasis, Disp: , Rfl:  .  escitalopram (LEXAPRO) 20 MG tablet, Take 1 tablet by mouth  daily, Disp: 90 tablet, Rfl: 0 .  cyclobenzaprine (FLEXERIL) 5 MG tablet, Take 1 tablet (5 mg total) by mouth 3 (three) times daily as needed for muscle spasms., Disp: 90 tablet, Rfl: 0 .  diphenhydrAMINE (BENADRYL ALLERGY) 25 MG tablet, Take 1 tablet by mouth as needed. Reported on 10/23/2015, Disp: , Rfl:  .  HYDROcodone-acetaminophen (NORCO) 10-325 MG tablet, Take 1 tablet by mouth every 6 (six) hours as needed., Disp: 20 tablet, Rfl: 0 .  predniSONE (STERAPRED UNI-PAK 48 TAB) 10 MG (48) TBPK tablet, Take by mouth daily. Take as directed, Disp: 42 tablet, Rfl: 0  Allergies  Allergen Reactions  . Other Other (See Comments)    Any fragrant smell sets her off  . Codeine   . Latex   . Bacitracin-Polymyxin B Rash    redness if left on for long periods   states no to latex   use paper tape please     ROS  Constitutional: Negative for fever, positive for weight change.  Respiratory:  Negative for cough and shortness of breath.   Cardiovascular: Negative for chest pain or palpitations.  Gastrointestinal: Negative for abdominal pain, no bowel changes.  Musculoskeletal: Negative for gait problem or joint swelling.  Skin: Positive for rash.  Neurological: Negative for dizziness or headache.  No other specific complaints in a complete review of systems (except as listed in HPI above).  Objective  Filed Vitals:   10/23/15 1412  BP: 110/60  Pulse: 105  Temp: 97.7 F (36.5 C)  TempSrc: Oral  Resp: 18  Height: 5\' 1"  (1.549 m)  Weight: 167 lb 11.2 oz (76.068 kg)  SpO2: 97%    Body mass index is 31.7 kg/(m^2).  Physical Exam  Constitutional: Patient appears well-developed and well-nourished. Obese .No distress.  HEENT: head atraumatic, normocephalic, pupils equal and reactive to light,  neck supple, throat within normal limits Cardiovascular: Normal rate, regular rhythm and normal heart sounds.  No murmur heard. No BLE edema. Pulmonary/Chest: Effort normal and breath sounds normal. No respiratory distress. Abdominal: Soft.  There is no tenderness. Psychiatric: Patient has a normal mood and affect. behavior is normal. Judgment and thought content normal. Muscular Skeletal: pain during palpation of lumbar spine, negative straight leg raise.  PHQ2/9: Depression screen [facility] 2/9 10/23/2015 06/01/2015 04/26/2015  Decreased Interest 0 0 0  Down, Depressed, Hopeless 0 0 0  PHQ - 2 Score 0 0 0    Fall Risk: Fall Risk  10/23/2015 06/01/2015 04/26/2015  Falls in the past year? No No No  Number falls in past yr: 1 - -  Injury with Fall? No - -    Functional Status Survey: Is the patient deaf or have difficulty hearing?: No Does the patient have difficulty seeing, even when wearing glasses/contacts?: No Does the patient have difficulty concentrating, remembering, or making decisions?: No Does the patient have difficulty walking or climbing stairs?: No Does the patient have  difficulty dressing or bathing?: No Does the patient have difficulty doing errands alone such as visiting a doctor's office or shopping?: No    Assessment & Plan  1. Post-menopausal bleeding  - TSH -referral to gyn  2. History of iron deficiency anemia  - CBC with Differential/Platelet - Ferritin - FSH - LH  3. Left lumbar radiculitis  Discussed importance of taking with food and also may need MRI if recurrence of symptoms or no resolution - predniSONE (STERAPRED UNI-PAK 48 TAB) 10 MG (48) TBPK tablet; Take by mouth daily. Take as directed  Dispense: 42 tablet; Refill: 0  4. Vitamin D deficiency  - VITAMIN D 25 Hydroxy (Vit-D Deficiency, Fractures)  5. B12 deficiency  - Vitamin B12  6. Dysmetabolic syndrome  - Hemoglobin A1c  7. Bariatric surgery status  - Comprehensive metabolic panel  8. Left-sided low back pain with left-sided sciatica  - cyclobenzaprine (FLEXERIL) 5 MG tablet; Take 1 tablet (5 mg total) by mouth 3 (three) times daily as needed for muscle spasms.  Dispense: 90 tablet; Refill: 0 - HYDROcodone-acetaminophen (NORCO) 10-325 MG tablet; Take 1 tablet by mouth every 6 (six) hours as needed.  Dispense: 20 tablet; Refill: 0 - predniSONE (STERAPRED UNI-PAK 48 TAB) 10 MG (48) TBPK tablet; Take by mouth daily. Take as directed  Dispense: 42 tablet; Refill: 0  9. Dyslipidemia  - Lipid panel

## 2015-10-25 LAB — CBC WITH DIFFERENTIAL/PLATELET
BASOS ABS: 0 10*3/uL (ref 0.0–0.2)
BASOS: 0 %
EOS (ABSOLUTE): 0 10*3/uL (ref 0.0–0.4)
Eos: 0 %
HEMOGLOBIN: 14 g/dL (ref 11.1–15.9)
Hematocrit: 39.4 % (ref 34.0–46.6)
IMMATURE GRANS (ABS): 0 10*3/uL (ref 0.0–0.1)
Immature Granulocytes: 0 %
LYMPHS: 16 %
Lymphocytes Absolute: 1.2 10*3/uL (ref 0.7–3.1)
MCH: 31.3 pg (ref 26.6–33.0)
MCHC: 35.5 g/dL (ref 31.5–35.7)
MCV: 88 fL (ref 79–97)
MONOCYTES: 8 %
Monocytes Absolute: 0.6 10*3/uL (ref 0.1–0.9)
NEUTROS ABS: 5.7 10*3/uL (ref 1.4–7.0)
Neutrophils: 76 %
Platelets: 393 10*3/uL — ABNORMAL HIGH (ref 150–379)
RBC: 4.48 x10E6/uL (ref 3.77–5.28)
RDW: 12 % — ABNORMAL LOW (ref 12.3–15.4)
WBC: 7.5 10*3/uL (ref 3.4–10.8)

## 2015-10-25 LAB — COMPREHENSIVE METABOLIC PANEL
A/G RATIO: 1.9 (ref 1.1–2.5)
ALT: 16 IU/L (ref 0–32)
AST: 14 IU/L (ref 0–40)
Albumin: 4.4 g/dL (ref 3.5–5.5)
Alkaline Phosphatase: 88 IU/L (ref 39–117)
BUN/Creatinine Ratio: 17 (ref 9–23)
BUN: 10 mg/dL (ref 6–24)
Bilirubin Total: 0.6 mg/dL (ref 0.0–1.2)
CALCIUM: 9.7 mg/dL (ref 8.7–10.2)
CO2: 24 mmol/L (ref 18–29)
CREATININE: 0.59 mg/dL (ref 0.57–1.00)
Chloride: 100 mmol/L (ref 96–106)
GFR, EST AFRICAN AMERICAN: 125 mL/min/{1.73_m2} (ref >59)
GFR, EST NON AFRICAN AMERICAN: 109 mL/min/{1.73_m2} (ref >59)
GLUCOSE: 90 mg/dL (ref 65–99)
Globulin, Total: 2.3 g/dL (ref 1.5–4.5)
Potassium: 4.3 mmol/L (ref 3.5–5.2)
Sodium: 141 mmol/L (ref 134–144)
TOTAL PROTEIN: 6.7 g/dL (ref 6.0–8.5)

## 2015-10-25 LAB — LIPID PANEL
CHOL/HDL RATIO: 2.4 ratio (ref 0.0–4.4)
Cholesterol, Total: 232 mg/dL — ABNORMAL HIGH (ref 100–199)
HDL: 98 mg/dL (ref >39)
LDL CALC: 121 mg/dL — AB (ref 0–99)
Triglycerides: 64 mg/dL (ref 0–149)
VLDL CHOLESTEROL CAL: 13 mg/dL (ref 5–40)

## 2015-10-25 LAB — TSH: TSH: 0.911 u[IU]/mL (ref 0.450–4.500)

## 2015-10-25 LAB — FERRITIN: FERRITIN: 18 ng/mL (ref 15–150)

## 2015-10-25 LAB — LUTEINIZING HORMONE: LH: 13.1 m[IU]/mL

## 2015-10-25 LAB — HEMOGLOBIN A1C
Est. average glucose Bld gHb Est-mCnc: 117 mg/dL
HEMOGLOBIN A1C: 5.7 % — AB (ref 4.8–5.6)

## 2015-10-25 LAB — VITAMIN D 25 HYDROXY (VIT D DEFICIENCY, FRACTURES): Vit D, 25-Hydroxy: 15.5 ng/mL — ABNORMAL LOW (ref 30.0–100.0)

## 2015-10-25 LAB — VITAMIN B12: VITAMIN B 12: 285 pg/mL (ref 211–946)

## 2015-10-25 LAB — FOLLICLE STIMULATING HORMONE: FSH: 31.9 m[IU]/mL

## 2015-10-26 ENCOUNTER — Other Ambulatory Visit: Payer: Self-pay | Admitting: Family Medicine

## 2015-10-26 MED ORDER — VITAMIN D (ERGOCALCIFEROL) 1.25 MG (50000 UNIT) PO CAPS: 50000.0000 [IU] | ORAL_CAPSULE | ORAL | Status: DC

## 2015-10-29 ENCOUNTER — Telehealth: Payer: Self-pay | Admitting: Family Medicine

## 2015-10-29 NOTE — Telephone Encounter (Signed)
Patient is returning your call, checking status on test results

## 2015-10-29 NOTE — Telephone Encounter (Signed)
Results were reviewed and a copy was placed in the mail.

## 2015-12-11 ENCOUNTER — Encounter: Payer: Self-pay | Admitting: Obstetrics and Gynecology

## 2015-12-11 ENCOUNTER — Ambulatory Visit (INDEPENDENT_AMBULATORY_CARE_PROVIDER_SITE_OTHER): Payer: 59 | Admitting: Obstetrics and Gynecology

## 2015-12-11 VITALS — BP 115/78 | HR 120 | Ht 61.5 in | Wt 167.3 lb

## 2015-12-11 DIAGNOSIS — E669 Obesity, unspecified: Secondary | ICD-10-CM | POA: Diagnosis not present

## 2015-12-11 DIAGNOSIS — N950 Postmenopausal bleeding: Secondary | ICD-10-CM | POA: Diagnosis not present

## 2015-12-11 DIAGNOSIS — Z1239 Encounter for other screening for malignant neoplasm of breast: Secondary | ICD-10-CM

## 2015-12-13 ENCOUNTER — Encounter: Payer: Self-pay | Admitting: Obstetrics and Gynecology

## 2015-12-13 DIAGNOSIS — E669 Obesity, unspecified: Secondary | ICD-10-CM | POA: Insufficient documentation

## 2015-12-13 DIAGNOSIS — E66811 Obesity, class 1: Secondary | ICD-10-CM | POA: Insufficient documentation

## 2015-12-13 NOTE — Progress Notes (Addendum)
GYNECOLOGY CLINIC PROGRESS NOTE  Subjective:    Ashlee Cruz is a 50 y.o. P29 post-menopausal female who presents for concerns regarding vaginal bleeding. She has been menopausal for approximately 2 years. Has never used HRT. Bleeding is described as flow about like a period and has occurred 2 times (once monthly over past 2 months).  Episodes last approximately 7-10 days. Other menopausal symptoms include: none. Workup to date: CBC and TSH.  Menstrual History: OB History    Gravida Para Term Preterm AB TAB SAB Ectopic Multiple Living   1    1  1    0     Last pap smear was "several years ago".  Reports normal results. Denies h/o abnormal pap smears.  Denies h/o STIs.    Past Medical History  Diagnosis Date  . Depression     History reviewed. No pertinent family history.  Past Surgical History  Procedure Laterality Date  . Bariatric surgery      Social History   Social History  . Marital Status: Married    Spouse Name: N/A  . Number of Children: N/A  . Years of Education: N/A   Occupational History  . Not on file.   Social History Main Topics  . Smoking status: Never Smoker   . Smokeless tobacco: Never Used  . Alcohol Use: No  . Drug Use: No  . Sexual Activity: Yes    Birth Control/ Protection: None   Other Topics Concern  . Not on file   Social History Narrative    Current Outpatient Prescriptions on File Prior to Visit  Medication Sig Dispense Refill  . Apremilast (OTEZLA) 30 MG TABS Take 1 tablet by mouth 2 (two) times daily. For psoriasis    . cyclobenzaprine (FLEXERIL) 5 MG tablet Take 1 tablet (5 mg total) by mouth 3 (three) times daily as needed for muscle spasms. 90 tablet 0  . diphenhydrAMINE (BENADRYL ALLERGY) 25 MG tablet Take 1 tablet by mouth as needed. Reported on 10/23/2015    . escitalopram (LEXAPRO) 20 MG tablet Take 1 tablet by mouth  daily 90 tablet 0  . HYDROcodone-acetaminophen (NORCO) 10-325 MG tablet Take 1 tablet by mouth every 6  (six) hours as needed. 20 tablet 0  . Vitamin D, Ergocalciferol, (DRISDOL) 50000 units CAPS capsule Take 1 capsule (50,000 Units total) by mouth every 7 (seven) days. 12 capsule 0   No current facility-administered medications on file prior to visit.    Allergies  Allergen Reactions  . Other Other (See Comments)    Any fragrant smell sets her off  . Codeine   . Latex   . Bacitracin-Polymyxin B Rash    redness if left on for long periods   states no to latex   use paper tape please     Review of Systems A comprehensive review of systems was negative except for: Constitutional: positive for weight gain (~ 10 lbs in past 6 months)    Objective:    BP 115/78 mmHg  Pulse 120  Ht 5' 1.5" (1.562 m)  Wt 167 lb 4.8 oz (75.887 kg)  BMI 31.10 kg/m2  LMP 10/27/2015 General appearance: alert and no distress Neck: no adenopathy, no carotid bruit, no JVD, supple, symmetrical, trachea midline and thyroid not enlarged, symmetric, no tenderness/mass/nodules Heart: regular rate and rhythm, S1, S2 normal, no murmur, click, rub or gallop Abdomen: soft, non-tender; bowel sounds normal; no masses,  no organomegaly Pelvic: external genitalia normal, rectovaginal septum normal and vagina normal  without discharge.  Cervix appear stenotic, no lesions, no CMT. Uterus normal shape, size, mobile, nontender.  Extremities: edema non-pitting and varicose veins noted. Nontender.  Neurologic: Grossly normal     Labs:  CBC Latest Ref Rng 10/24/2015  WBC 3.4 - 10.8 x10E3/uL 7.5  Hemoglobin 12.0-16.0 g/dL 14.0  Hematocrit 34.0 - 46.6 % 39.4  Platelets 150 - 379 x10E3/uL 393(H)    Lab Results  Component Value Date   TSH 0.911 10/24/2015    Results for NYALA, CUNDIFF (MRN [id number]) as of 12/11/2015 22:00  Ref. Range 10/24/2015 09:00  LH Latest Units: mIU/mL 13.1  FSH Latest Units: mIU/mL 31.9    Assessment:    Postmenopausal bleeding   Obesity, Class 1  Plan:   - Discussed etiologies of  postmenopausal bleeding, concern about precancerous/hyperplasia or cancerous etiology (5 to 10% percent of cases). Also discussed role of unopposed estrogen exposure in leading to thickened or proliferative endometrium; and its possible correlation with endometrial hyperplasia/carcinoma.  Patient was reassured that endometrial atrophy and endometrial polyps are the most common causes of postmenopausal bleeding.  Uterine bleeding in postmenopausal women is usually light and self-limited. Exclusion of cancer is the main objective; therefore, treatment is usually unnecessary once cancer has been excluded.  The primary goal in the diagnostic evaluation of postmenopausal women with uterine bleeding is to exclude malignancy; this can include endometrial biopsy and pelvic ultrasound.   Further diagnostic evaluation is indicated for recurrent or persistent bleeding.   All questions answered. - Endometrial biopsy - see separate procedure note. - Pap smear performed. Patient notes it has been greater than 3 years since last pap.  - Pelvic ultrasound ordered.  - Follow up in 1-2 weeks for all results.  - Patient requests referral for mammogram as it has been several years since her last one.  Will place order.    Endometrial Biopsy Procedure Note  The patient is positioned on the exam table in the dorsal lithotomy position. Bimanual exam confirms uterine position and size. A Graves speculum is placed into the vagina. A single toothed tenaculum is placed onto the anterior lip of the cervix. The cervix was then dilated using dilators. The pipette is placed into the endocervical canal and is advanced to the uterine fundus. Using a piston like technique, with vacuum created by withdrawing the stylus, the endometrial specimen is obtained and transferred to the biopsy container. Minimal bleeding is encountered. The procedure is well tolerated.   Uterine Position:posterior   Uterine Length: 11 cm   Uterine Specimen:  Scant   Post procedure instructions are given. The patient is scheduled for follow up appointment.    [name], MD [facility]

## 2015-12-14 LAB — PATHOLOGY

## 2015-12-14 LAB — PAP IG AND HPV HIGH-RISK
HPV, high-risk: NEGATIVE
PAP SMEAR COMMENT: 0

## 2015-12-14 NOTE — Addendum Note (Signed)
Addended by: Augusto Gamble on: 12/14/2015 02:32 PM   Modules accepted: Orders

## 2015-12-19 ENCOUNTER — Other Ambulatory Visit: Payer: Self-pay | Admitting: Obstetrics and Gynecology

## 2015-12-19 DIAGNOSIS — N950 Postmenopausal bleeding: Secondary | ICD-10-CM

## 2015-12-20 ENCOUNTER — Encounter: Payer: Self-pay | Admitting: Obstetrics and Gynecology

## 2015-12-20 ENCOUNTER — Ambulatory Visit (INDEPENDENT_AMBULATORY_CARE_PROVIDER_SITE_OTHER): Payer: 59

## 2015-12-20 ENCOUNTER — Ambulatory Visit (INDEPENDENT_AMBULATORY_CARE_PROVIDER_SITE_OTHER): Payer: 59 | Admitting: Obstetrics and Gynecology

## 2015-12-20 VITALS — BP 113/75 | HR 87 | Ht 61.5 in | Wt 167.4 lb

## 2015-12-20 DIAGNOSIS — N950 Postmenopausal bleeding: Secondary | ICD-10-CM

## 2015-12-20 DIAGNOSIS — D259 Leiomyoma of uterus, unspecified: Secondary | ICD-10-CM

## 2015-12-24 NOTE — Progress Notes (Signed)
    GYNECOLOGY PROGRESS NOTE  Subjective:    Patient ID: Ashlee Cruz, female    DOB: 1966/06/28, 50 y.o.   MRN: [id number]  HPI  Patient is a 50 y.o. G20P0010 female who presents for f/u after ultrasound and biopsy for h/o PMB.  Patient notes that she has had no further episodes of vaginal bleeding since biopsy.  Denies pelvic pain.   The following portions of the patient's history were reviewed and updated as appropriate: allergies, current medications, past family history, past medical history, past social history, past surgical history and problem list.  Review of Systems Pertinent items noted in HPI and remainder of comprehensive ROS otherwise negative.   Objective:   Blood pressure 113/75, pulse 87, height 5' 1.5" (1.562 m), weight 167 lb 6.4 oz (75.932 kg), last menstrual period 10/27/2015. General appearance: alert and no distress Exam deferred.    Pathology: 12/11/15: Pap smear negative  12/11/15:  Diagnosis:  ENDOMETRIUM, BIOPSY:  BLOOD CLOT AND SMALL FRAGMENTS OF NEGATIVE ENDOMETRIAL TISSUE.    Imaging Pelvic Ultrasound 12/20/2015:   Indications:PMB Findings:  The uterus measures 8.4 x 5.2 x 7.3cm. Echo texture is heterogenous with evidence of focal masses. Within the uterus are multiple suspected fibroids (atleast 7) largest 3 measuring: Fibroid 1: left fundal 2.8 x 2.7 x 2.4cm Fibroid 2: Right Fundal 3 x 3.4 x 3.2cm Fibroid 3: posterior fundal communicating with endometrium 2.5 x 2.1 x 2.2cm. The Endometrium measures 4.3 mm.  Right Ovary measures 2.6 x 1.3 x 1.3 cm. It is normal in appearance. Left Ovary measures 2.3 x 1.4 x 1.4 cm. It is normal appearance. Survey of the adnexa demonstrates no adnexal masses. There is no free fluid in the cul de sac.  Impression: 1. Fibroid Uterus  Recommendations: 1.Clinical correlation with the patient's History and Physical Exam.  Assessment:   Postmenopausal bleeding Fibroid uterus  Plan:   Discussed all  results and ultrasound with patient.  Endometrial biopsy negative for malignancy, ultrasound noting multiple small fibroids, with at least 1 submucosal, communicating with the endometrium.  Endometrial stripe thin.  Submucosal fibroid communicating with atrophic endometrium could be cause of PMB.  Discussed that no treatment is required, this usually spontaneously resolves.  If patient continues to have sporadic episodes of bleeding, can treat atrophic endometrium with 2- 4 weeks of low dose progesterone.   Patient to f/u as needed, or if symptoms persist or worsen.   A total of 15 minutes were spent face-to-face with the patient during this encounter and over half of that time dealt with counseling and coordination of care.   [name], MD [facility]

## 2016-01-29 ENCOUNTER — Ambulatory Visit (INDEPENDENT_AMBULATORY_CARE_PROVIDER_SITE_OTHER): Payer: 59 | Admitting: Family Medicine

## 2016-01-29 ENCOUNTER — Encounter: Payer: Self-pay | Admitting: Family Medicine

## 2016-01-29 VITALS — BP 102/60 | HR 113 | Temp 97.9°F | Resp 18 | Ht 61.0 in | Wt 169.9 lb

## 2016-01-29 DIAGNOSIS — E538 Deficiency of other specified B group vitamins: Secondary | ICD-10-CM

## 2016-01-29 DIAGNOSIS — M5416 Radiculopathy, lumbar region: Secondary | ICD-10-CM

## 2016-01-29 DIAGNOSIS — E559 Vitamin D deficiency, unspecified: Secondary | ICD-10-CM | POA: Diagnosis not present

## 2016-01-29 DIAGNOSIS — M5442 Lumbago with sciatica, left side: Secondary | ICD-10-CM | POA: Diagnosis not present

## 2016-01-29 DIAGNOSIS — Z9884 Bariatric surgery status: Secondary | ICD-10-CM | POA: Diagnosis not present

## 2016-01-29 DIAGNOSIS — F330 Major depressive disorder, recurrent, mild: Secondary | ICD-10-CM

## 2016-01-29 DIAGNOSIS — E8881 Metabolic syndrome: Secondary | ICD-10-CM | POA: Diagnosis not present

## 2016-01-29 MED ORDER — CYCLOBENZAPRINE HCL 5 MG PO TABS: 5.0000 mg | ORAL_TABLET | Freq: Three times a day (TID) | ORAL | Status: DC

## 2016-01-29 MED ORDER — GABAPENTIN 100 MG PO CAPS: 100.0000 mg | ORAL_CAPSULE | Freq: Three times a day (TID) | ORAL | Status: DC

## 2016-01-29 MED ORDER — HYDROCODONE-ACETAMINOPHEN 10-325 MG PO TABS: 1.0000 | ORAL_TABLET | Freq: Four times a day (QID) | ORAL | Status: DC | PRN

## 2016-01-29 MED ORDER — ESCITALOPRAM OXALATE 20 MG PO TABS: 20.0000 mg | ORAL_TABLET | Freq: Every day | ORAL | Status: DC

## 2016-01-29 MED ORDER — CYANOCOBALAMIN 1000 MCG/ML IJ SOLN
1000.0000 ug | Freq: Once | INTRAMUSCULAR | Status: AC
  Administered 2016-01-29: 1000 ug via INTRAMUSCULAR

## 2016-01-29 NOTE — Addendum Note (Signed)
Addended by: Steele Sizer F on: 01/29/2016 12:19 PM   Modules accepted: Orders

## 2016-01-29 NOTE — Progress Notes (Signed)
Name: Ashlee Cruz   MRN: [id number]    DOB: [birth year]   Date:01/29/2016       Progress Note  Subjective  Chief Complaint  Chief Complaint  Patient presents with  . Back Pain    Onset-past 3 weeks symptoms have gotten worst-patient states the pain goes from cramping and throbbing to sharp pain. Patient states the pain is now radiating down to her left foot making her foot goes numb. Patient states standing for long periods(15 mins or longer) of time make it worst but helps when she takes the pressure off of her leg by elevation.     HPI  Lumbar Radiculitis: she was seen 04/2015 and was given prednisone taper and radiculitis resolved, but she continued to have low back pain, that has been daily now, no longer on her back , but has burning, tingling, stabbing pain that radiates down to left toes.  She was seen [facility] chiropractor but symptoms are not improving now. She states sometimes she has to squat or raise her left leg to help relieve symptoms. Discussed options, we will try Gabapentin, continue Flexeril at night, Hydrocodone prn only.   Major Depression Mild Recurrent: taking Lexapro for years now. She has fatigue, very seldom has a crying spells, she has occasional anhedonia, she denies problems with her focus or ability to work. She is pain and is worried about her back.   Bariatric Surgery: she stopped taking all supplements, she has a history of B12, D and iron deficiency anemia.She is not taking B12 supplementation  Metabolic Syndrome: [id number] has been improving, denies polyphagia, polydipsia or polyuria.    Patient Active Problem List   Diagnosis Date Noted  . Fibroid uterus 12/20/2015  . Class 1 obesity 12/13/2015  . History of iron deficiency anemia 10/23/2015  . Left lumbar radiculitis 10/23/2015  . Absence of menstruation 04/26/2015  . B12 deficiency 04/26/2015  . Depression, major, recurrent, mild ([place]) 04/26/2015  . Dyslipidemia 04/26/2015  . Dysmetabolic syndrome  [id number]  . Bariatric surgery status 04/26/2015  . H/O: HTN (hypertension) 04/26/2015  . Obesity (BMI 30.0-34.9) 04/26/2015  . Psoriasis 04/26/2015  . Allergic rhinitis, seasonal 04/26/2015  . Vitamin D deficiency 04/26/2015  . Bilateral knee pain 04/26/2015    Past Surgical History  Procedure Laterality Date  . Bariatric surgery      History reviewed. No pertinent family history.  Social History   Social History  . Marital Status: Married    Spouse Name: N/A  . Number of Children: N/A  . Years of Education: N/A   Occupational History  . Not on file.   Social History Main Topics  . Smoking status: Never Smoker   . Smokeless tobacco: Never Used  . Alcohol Use: No  . Drug Use: No  . Sexual Activity: Yes    Birth Control/ Protection: None   Other Topics Concern  . Not on file   Social History Narrative     Current outpatient prescriptions:  .  Apremilast (OTEZLA) 30 MG TABS, Take 1 tablet by mouth 2 (two) times daily. For psoriasis, Disp: , Rfl:  .  cyclobenzaprine (FLEXERIL) 5 MG tablet, Take 1 tablet (5 mg total) by mouth 3 (three) times daily as needed for muscle spasms., Disp: 90 tablet, Rfl: 0 .  escitalopram (LEXAPRO) 20 MG tablet, Take 1 tablet by mouth  daily, Disp: 90 tablet, Rfl: 0 .  Vitamin D, Ergocalciferol, (DRISDOL) 50000 units CAPS capsule, Take 1 capsule (50,000 Units total) by mouth every  7 (seven) days., Disp: 12 capsule, Rfl: 0 .  diphenhydrAMINE (BENADRYL ALLERGY) 25 MG tablet, Take 1 tablet by mouth as needed. Reported on 01/29/2016, Disp: , Rfl:  .  gabapentin (NEURONTIN) 100 MG capsule, Take 1 capsule (100 mg total) by mouth 3 (three) times daily. Titrate up to a goal of 300mg  three times daily, Disp: 180 capsule, Rfl: 0 .  HYDROcodone-acetaminophen (NORCO) 10-325 MG tablet, Take 1 tablet by mouth every 6 (six) hours as needed., Disp: 20 tablet, Rfl: 0  Allergies  Allergen Reactions  . Other Other (See Comments)    Any fragrant smell sets  her off  . Codeine   . Latex   . Bacitracin-Polymyxin B Rash    redness if left on for long periods   states no to latex   use paper tape please     ROS  Ten systems reviewed and is negative except as mentioned in HPI   Objective  Filed Vitals:   01/29/16 1143  BP: 102/60  Pulse: 113  Temp: 97.9 F (36.6 C)  TempSrc: Oral  Resp: 18  Height: 5\' 1"  (1.549 m)  Weight: 169 lb 14.4 oz (77.066 kg)  SpO2: 95%    Body mass index is 32.12 kg/(m^2).  Physical Exam  Constitutional: Patient appears well-developed and well-nourished. Obese No distress.  HEENT: head atraumatic, normocephalic, pupils equal and reactive to light,  neck supple, throat within normal limits Cardiovascular: Normal rate, regular rhythm and normal heart sounds.  No murmur heard. No BLE edema. Pulmonary/Chest: Effort normal and breath sounds normal. No respiratory distress. Abdominal: Soft.  There is no tenderness. Psychiatric: Patient has a normal mood and affect. behavior is normal. Judgment and thought content normal. Muscular Skeletal: negative straight leg raise    PHQ2/9: Depression screen [facility] 2/9 01/29/2016 10/23/2015 06/01/2015 04/26/2015  Decreased Interest 0 0 0 0  Down, Depressed, Hopeless 0 0 0 0  PHQ - 2 Score 0 0 0 0     Fall Risk: Fall Risk  01/29/2016 10/23/2015 06/01/2015 04/26/2015  Falls in the past year? No No No No  Number falls in past yr: - 1 - -  Injury with Fall? - No - -     Functional Status Survey: Is the patient deaf or have difficulty hearing?: No Does the patient have difficulty seeing, even when wearing glasses/contacts?: No Does the patient have difficulty concentrating, remembering, or making decisions?: No Does the patient have difficulty walking or climbing stairs?: No Does the patient have difficulty dressing or bathing?: No Does the patient have difficulty doing errands alone such as visiting a doctor's office or shopping?: No    Assessment & Plan  1. Left  lumbar radiculitis  - MR Lumbar Spine Wo Contrast; Future - HYDROcodone-acetaminophen (NORCO) 10-325 MG tablet; Take 1 tablet by mouth every 6 (six) hours as needed.  Dispense: 20 tablet; Refill: 0 - gabapentin (NEURONTIN) 100 MG capsule; Take 1 capsule (100 mg total) by mouth 3 (three) times daily. Titrate up to a goal of 300mg  three times daily  Dispense: 180 capsule; Refill: 0   2. Left-sided low back pain with left-sided sciatica  - cyclobenzaprine (FLEXERIL) 5 MG tablet; Take 1 tablet (5 mg total) by mouth 3 (three) times daily.  Dispense: 90 tablet; Refill: 0  3. Depression, major, recurrent, mild ([facility])  - escitalopram (LEXAPRO) 20 MG tablet; Take 1 tablet (20 mg total) by mouth daily.  Dispense: 90 tablet; Refill: 1  4. B12 deficiency  - cyanocobalamin ((VITAMIN  B-12)) injection 1,000 mcg; Inject 1 mL (1,000 mcg total) into the muscle once.  5. Vitamin D deficiency  Take otc vitamin D 2000 units daily   6. Dysmetabolic syndrome  Continue life style modification   7. Bariatric surgery status  Needs to resume supplementations

## 2016-02-01 ENCOUNTER — Ambulatory Visit: Payer: 59 | Admitting: Family Medicine

## 2016-02-01 ENCOUNTER — Telehealth: Payer: Self-pay | Admitting: Family Medicine

## 2016-02-01 NOTE — Telephone Encounter (Signed)
Checking status on MRI referral.

## 2016-02-04 NOTE — Telephone Encounter (Signed)
I tried to contact patient to inform her that she has been scheduled for her MRI on 02/11/16 @ 9am at [facility], but there was no answer. A message was left for her with this information.

## 2016-02-11 ENCOUNTER — Ambulatory Visit
Admission: RE | Admit: 2016-02-11 | Discharge: 2016-02-11 | Disposition: A | Discharge status: Home / Self Care | Payer: 59 | Source: Ambulatory Visit | Attending: Family Medicine | Admitting: Family Medicine

## 2016-02-11 ENCOUNTER — Other Ambulatory Visit: Payer: Self-pay | Admitting: Family Medicine

## 2016-02-11 DIAGNOSIS — M5126 Other intervertebral disc displacement, lumbar region: Secondary | ICD-10-CM | POA: Insufficient documentation

## 2016-02-11 DIAGNOSIS — M5416 Radiculopathy, lumbar region: Secondary | ICD-10-CM | POA: Diagnosis not present

## 2016-02-11 DIAGNOSIS — M5127 Other intervertebral disc displacement, lumbosacral region: Secondary | ICD-10-CM | POA: Diagnosis not present

## 2016-02-11 DIAGNOSIS — M5116 Intervertebral disc disorders with radiculopathy, lumbar region: Secondary | ICD-10-CM

## 2016-02-16 ENCOUNTER — Other Ambulatory Visit: Payer: Self-pay | Admitting: Family Medicine

## 2016-02-16 HISTORY — PX: LUMBAR SPINE SURGERY: SHX701

## 2016-02-28 ENCOUNTER — Ambulatory Visit: Payer: 59

## 2016-02-29 ENCOUNTER — Ambulatory Visit (INDEPENDENT_AMBULATORY_CARE_PROVIDER_SITE_OTHER): Payer: 59

## 2016-02-29 DIAGNOSIS — E538 Deficiency of other specified B group vitamins: Secondary | ICD-10-CM | POA: Diagnosis not present

## 2016-02-29 MED ORDER — CYANOCOBALAMIN 1000 MCG/ML IJ SOLN
1000.0000 ug | Freq: Once | INTRAMUSCULAR | Status: AC
  Administered 2016-02-29: 1000 ug via INTRAMUSCULAR

## 2016-03-18 ENCOUNTER — Other Ambulatory Visit: Payer: Self-pay | Admitting: Family Medicine

## 2016-03-18 DIAGNOSIS — M5416 Radiculopathy, lumbar region: Secondary | ICD-10-CM

## 2016-03-18 NOTE — Telephone Encounter (Signed)
Patient requesting refill. 

## 2016-03-31 ENCOUNTER — Ambulatory Visit: Payer: 59

## 2016-04-17 ENCOUNTER — Ambulatory Visit (INDEPENDENT_AMBULATORY_CARE_PROVIDER_SITE_OTHER): Payer: 59

## 2016-04-17 DIAGNOSIS — E538 Deficiency of other specified B group vitamins: Secondary | ICD-10-CM | POA: Diagnosis not present

## 2016-04-17 MED ORDER — CYANOCOBALAMIN 1000 MCG/ML IJ SOLN
1000.0000 ug | Freq: Once | INTRAMUSCULAR | Status: AC
  Administered 2016-04-17: 1000 ug via INTRAMUSCULAR

## 2016-04-23 ENCOUNTER — Telehealth: Payer: Self-pay | Admitting: Family Medicine

## 2016-04-23 NOTE — Telephone Encounter (Signed)
Please call it in 

## 2016-04-24 ENCOUNTER — Other Ambulatory Visit: Payer: Self-pay

## 2016-04-24 DIAGNOSIS — F330 Major depressive disorder, recurrent, mild: Secondary | ICD-10-CM

## 2016-04-24 MED ORDER — ESCITALOPRAM OXALATE 20 MG PO TABS: 20.0000 mg | ORAL_TABLET | Freq: Every day | ORAL | 1 refills | Status: DC

## 2016-04-24 NOTE — Telephone Encounter (Signed)
Called in a 30 day supply of Lexapro for patient at [facility] and patient has been notified by phone.

## 2016-04-24 NOTE — Telephone Encounter (Signed)
Patient requesting refill of Escitalopram to be sent to [facility].

## 2016-05-16 ENCOUNTER — Ambulatory Visit (INDEPENDENT_AMBULATORY_CARE_PROVIDER_SITE_OTHER): Payer: 59

## 2016-05-16 DIAGNOSIS — E538 Deficiency of other specified B group vitamins: Secondary | ICD-10-CM | POA: Diagnosis not present

## 2016-05-16 DIAGNOSIS — Z23 Encounter for immunization: Secondary | ICD-10-CM | POA: Diagnosis not present

## 2016-05-16 MED ORDER — CYANOCOBALAMIN 1000 MCG/ML IJ SOLN
1000.0000 ug | Freq: Once | INTRAMUSCULAR | Status: AC
  Administered 2016-05-16: 1000 ug via INTRAMUSCULAR

## 2016-06-09 ENCOUNTER — Encounter: Payer: Self-pay | Admitting: Family Medicine

## 2016-06-09 ENCOUNTER — Ambulatory Visit (INDEPENDENT_AMBULATORY_CARE_PROVIDER_SITE_OTHER): Payer: 59 | Admitting: Family Medicine

## 2016-06-09 VITALS — BP 110/62 | HR 99 | Temp 98.8°F | Resp 18 | Ht 61.0 in | Wt 170.5 lb

## 2016-06-09 DIAGNOSIS — M5416 Radiculopathy, lumbar region: Secondary | ICD-10-CM | POA: Diagnosis not present

## 2016-06-09 MED ORDER — CYCLOBENZAPRINE HCL 10 MG PO TABS: 10.0000 mg | ORAL_TABLET | Freq: Three times a day (TID) | ORAL | 0 refills | Status: DC | PRN

## 2016-06-09 MED ORDER — PREDNISONE 10 MG (48) PO TBPK: ORAL_TABLET | Freq: Every day | ORAL | 0 refills | Status: DC

## 2016-06-09 NOTE — Progress Notes (Signed)
Name: Ashlee Cruz   MRN: [id number]    DOB: [birth year]   Date:06/09/2016       Progress Note  Subjective  Chief Complaint  Chief Complaint  Patient presents with  . Back Pain    Had surgery in July and was doing great, then all the sudden about 2 weeks ago now her pain is progressively worst. Patient states went trying to bending over her pain was so excruicating. Lower back and radiates left hip feels like she is hyper-extending something. Has taken muscle relaxer prn.     HPI  Left lower back pain: she had back surgery done by Dr. Consuella Lose  in July 2017 and was doing great. However about 2 weeks ago she picked up a printer at work and since than she has noticed low back pain that got progressively worse. Yesterday she had the most pain, radiating to left mid back and down buttocks and to posterior left thigh. She states pain is worse at night, causing her to not sleep well. She denies weakness or pain with rom of spine. She denies bowel or bladder incontinence. She took Flexeril this am with some improvement of symptoms. I spoke to her neurosurgeon after examining patient and we will try Prednisone taper and she will follow up with him in a couple of weeks.   Patient Active Problem List   Diagnosis Date Noted  . Fibroid uterus 12/20/2015  . Class 1 obesity 12/13/2015  . History of iron deficiency anemia 10/23/2015  . Left lumbar radiculitis 10/23/2015  . Absence of menstruation 04/26/2015  . B12 deficiency 04/26/2015  . Depression, major, recurrent, mild ([place]) 04/26/2015  . Dyslipidemia 04/26/2015  . Dysmetabolic syndrome [id number]  . Bariatric surgery status 04/26/2015  . H/O: HTN (hypertension) 04/26/2015  . Obesity (BMI 30.0-34.9) 04/26/2015  . Psoriasis 04/26/2015  . Allergic rhinitis, seasonal 04/26/2015  . Vitamin D deficiency 04/26/2015  . Bilateral knee pain 04/26/2015    Past Surgical History:  Procedure Laterality Date  . BARIATRIC SURGERY      History  reviewed. No pertinent family history.  Social History   Social History  . Marital status: Married    Spouse name: N/A  . Number of children: N/A  . Years of education: N/A   Occupational History  . Not on file.   Social History Main Topics  . Smoking status: Never Smoker  . Smokeless tobacco: Never Used  . Alcohol use No  . Drug use: No  . Sexual activity: Yes    Birth control/ protection: None   Other Topics Concern  . Not on file   Social History Narrative  . No narrative on file     Current Outpatient Prescriptions:  .  Apremilast (OTEZLA) 30 MG TABS, Take 1 tablet by mouth 2 (two) times daily. For psoriasis, Disp: , Rfl:  .  diphenhydrAMINE (BENADRYL ALLERGY) 25 MG tablet, Take 1 tablet by mouth as needed. Reported on 01/29/2016, Disp: , Rfl:  .  escitalopram (LEXAPRO) 20 MG tablet, Take 1 tablet (20 mg total) by mouth daily., Disp: 90 tablet, Rfl: 1 .  Vitamin D, Ergocalciferol, (DRISDOL) 50000 units CAPS capsule, Take 1 capsule (50,000 Units total) by mouth every 7 (seven) days., Disp: 12 capsule, Rfl: 0  Allergies  Allergen Reactions  . Other Other (See Comments)    Any fragrant smell sets her off  . Codeine   . Latex   . Bacitracin-Polymyxin B Rash    redness if left on for  long periods   states no to latex   use paper tape please     ROS  Ten systems reviewed and is negative except as mentioned in HPI   Objective  Vitals:   06/09/16 1619  BP: 110/62  Pulse: 99  Resp: 18  Temp: 98.8 F (37.1 C)  TempSrc: Oral  SpO2: 99%  Weight: 170 lb 8 oz (77.3 kg)  Height: 5\' 1"  (1.549 m)    Body mass index is 32.22 kg/m.  Physical Exam  Constitutional: Patient appears well-developed and well-nourished. Obese  No distress.  HEENT: head atraumatic, normocephalic, pupils equal and reactive to light,  neck supple, throat within normal limits Cardiovascular: Normal rate, regular rhythm and normal heart sounds.  No murmur heard. No BLE  edema. Pulmonary/Chest: Effort normal and breath sounds normal. No respiratory distress. Abdominal: Soft.  There is no tenderness. Psychiatric: Patient has a normal mood and affect. behavior is normal. Judgment and thought content normal. Muscular Skeletal: pain during palpation of left lower back, positive straight leg raise on left, normal sensation, normal rom of lumbar spine   PHQ2/9: Depression screen [facility] 2/9 06/09/2016 01/29/2016 10/23/2015 06/01/2015 04/26/2015  Decreased Interest 0 0 0 0 0  Down, Depressed, Hopeless 0 0 0 0 0  PHQ - 2 Score 0 0 0 0 0     Fall Risk: Fall Risk  06/09/2016 01/29/2016 10/23/2015 06/01/2015 04/26/2015  Falls in the past year? No No No No No  Number falls in past yr: - - 1 - -  Injury with Fall? - - No - -     Functional Status Survey: Is the patient deaf or have difficulty hearing?: No Does the patient have difficulty seeing, even when wearing glasses/contacts?: No Does the patient have difficulty concentrating, remembering, or making decisions?: No Does the patient have difficulty walking or climbing stairs?: No Does the patient have difficulty dressing or bathing?: No Does the patient have difficulty doing errands alone such as visiting a doctor's office or shopping?: No    Assessment & Plan  1. Left lumbar radiculitis  Discussed possible side effects - predniSONE (STERAPRED UNI-PAK 48 TAB) 10 MG (48) TBPK tablet; Take by mouth daily.  Dispense: 48 tablet; Refill: 0 - cyclobenzaprine (FLEXERIL) 10 MG tablet; Take 1 tablet (10 mg total) by mouth 3 (three) times daily as needed for muscle spasms.  Dispense: 90 tablet; Refill: 0

## 2016-06-20 ENCOUNTER — Ambulatory Visit: Payer: 59

## 2016-06-27 ENCOUNTER — Telehealth: Payer: Self-pay

## 2016-06-27 ENCOUNTER — Ambulatory Visit (INDEPENDENT_AMBULATORY_CARE_PROVIDER_SITE_OTHER): Payer: 59

## 2016-06-27 DIAGNOSIS — E538 Deficiency of other specified B group vitamins: Secondary | ICD-10-CM | POA: Diagnosis not present

## 2016-06-27 MED ORDER — CYANOCOBALAMIN 1000 MCG/ML IJ SOLN
1000.0000 ug | Freq: Once | INTRAMUSCULAR | Status: AC
  Administered 2016-06-27: 1000 ug via INTRAMUSCULAR

## 2016-06-27 NOTE — Telephone Encounter (Signed)
Patient wanted to let you know she saw the neurosurgeon and they are going to do a steroid injection in her back

## 2016-07-02 ENCOUNTER — Other Ambulatory Visit: Payer: Self-pay | Admitting: Neurosurgery

## 2016-07-02 DIAGNOSIS — M5442 Lumbago with sciatica, left side: Principal | ICD-10-CM

## 2016-07-02 DIAGNOSIS — G8929 Other chronic pain: Secondary | ICD-10-CM

## 2016-07-09 ENCOUNTER — Ambulatory Visit
Admission: RE | Admit: 2016-07-09 | Discharge: 2016-07-09 | Disposition: A | Discharge status: Home / Self Care | Payer: 59 | Source: Ambulatory Visit | Attending: Neurosurgery | Admitting: Neurosurgery

## 2016-07-09 ENCOUNTER — Other Ambulatory Visit: Payer: Self-pay | Admitting: Neurosurgery

## 2016-07-09 DIAGNOSIS — G8929 Other chronic pain: Secondary | ICD-10-CM

## 2016-07-09 DIAGNOSIS — M5442 Lumbago with sciatica, left side: Principal | ICD-10-CM

## 2016-07-09 MED ORDER — IOPAMIDOL (ISOVUE-M 200) INJECTION 41%
1.0000 mL | Freq: Once | INTRAMUSCULAR | Status: AC
  Administered 2016-07-09: 1 mL via EPIDURAL

## 2016-07-09 MED ORDER — METHYLPREDNISOLONE ACETATE 40 MG/ML INJ SUSP (RADIOLOG
120.0000 mg | Freq: Once | INTRAMUSCULAR | Status: AC
  Administered 2016-07-09: 120 mg via EPIDURAL

## 2016-07-09 NOTE — Progress Notes (Signed)
Left leg is weak and pt is waiting.

## 2016-07-09 NOTE — Discharge Instructions (Signed)

## 2016-07-15 ENCOUNTER — Ambulatory Visit (INDEPENDENT_AMBULATORY_CARE_PROVIDER_SITE_OTHER): Payer: 59 | Admitting: Family Medicine

## 2016-07-15 ENCOUNTER — Encounter: Payer: Self-pay | Admitting: Family Medicine

## 2016-07-15 VITALS — BP 104/78 | HR 107 | Temp 98.2°F | Resp 16 | Ht 61.0 in | Wt 173.1 lb

## 2016-07-15 DIAGNOSIS — R490 Dysphonia: Secondary | ICD-10-CM

## 2016-07-15 DIAGNOSIS — H10023 Other mucopurulent conjunctivitis, bilateral: Secondary | ICD-10-CM | POA: Diagnosis not present

## 2016-07-15 DIAGNOSIS — R058 Other specified cough: Secondary | ICD-10-CM

## 2016-07-15 DIAGNOSIS — R05 Cough: Secondary | ICD-10-CM | POA: Diagnosis not present

## 2016-07-15 MED ORDER — CIPROFLOXACIN HCL 0.3 % OP SOLN: 2.0000 [drp] | OPHTHALMIC | 0 refills | Status: DC

## 2016-07-15 NOTE — Progress Notes (Signed)
Name: Ashlee Cruz   MRN: [id number]    DOB: [birth year]   Date:07/15/2016       Progress Note  Subjective  Chief Complaint  Chief Complaint  Patient presents with  . Conjunctivitis    pt stated that it started her lft eye but has spread to both eyes mostly the rt now    HPI  Viral illness and conjunctivitis: son and husband have been sick with cold symptoms. She has noticed hoarseness and mild dry cough, but worse symptom is mild blurred vision, itching sensation on both eyes and feels filled with mucus, woke up with eyes matted shut this morning.   Patient Active Problem List   Diagnosis Date Noted  . Fibroid uterus 12/20/2015  . Class 1 obesity 12/13/2015  . History of iron deficiency anemia 10/23/2015  . Left lumbar radiculitis 10/23/2015  . Absence of menstruation 04/26/2015  . B12 deficiency 04/26/2015  . Depression, major, recurrent, mild ([place]) 04/26/2015  . Dyslipidemia 04/26/2015  . Dysmetabolic syndrome [id number]  . Bariatric surgery status 04/26/2015  . H/O: HTN (hypertension) 04/26/2015  . Obesity (BMI 30.0-34.9) 04/26/2015  . Psoriasis 04/26/2015  . Allergic rhinitis, seasonal 04/26/2015  . Vitamin D deficiency 04/26/2015  . Bilateral knee pain 04/26/2015    Past Surgical History:  Procedure Laterality Date  . BARIATRIC SURGERY      History reviewed. No pertinent family history.  Social History   Social History  . Marital status: Married    Spouse name: N/A  . Number of children: N/A  . Years of education: N/A   Occupational History  . Not on file.   Social History Main Topics  . Smoking status: Never Smoker  . Smokeless tobacco: Never Used  . Alcohol use No  . Drug use: No  . Sexual activity: Yes    Birth control/ protection: None   Other Topics Concern  . Not on file   Social History Narrative  . No narrative on file     Current Outpatient Prescriptions:  .  omeprazole (PRILOSEC) 20 MG capsule, Take 20 mg by mouth 2 (two) times  daily before a meal., Disp: , Rfl:  .  Apremilast (OTEZLA) 30 MG TABS, Take 1 tablet by mouth 2 (two) times daily. For psoriasis, Disp: , Rfl:  .  ciprofloxacin (CILOXAN) 0.3 % ophthalmic solution, Place 2 drops into both eyes every 2 (two) hours. Administer 1 drop, every 2 hours, while awake, for 2 days. Then 1 drop, every 4 hours, while awake, for the next 5 days., Disp: 5 mL, Rfl: 0 .  cyclobenzaprine (FLEXERIL) 10 MG tablet, Take 1 tablet (10 mg total) by mouth 3 (three) times daily as needed for muscle spasms., Disp: 90 tablet, Rfl: 0 .  diphenhydrAMINE (BENADRYL ALLERGY) 25 MG tablet, Take 1 tablet by mouth as needed. Reported on 01/29/2016, Disp: , Rfl:  .  escitalopram (LEXAPRO) 20 MG tablet, Take 1 tablet (20 mg total) by mouth daily., Disp: 90 tablet, Rfl: 1 .  predniSONE (STERAPRED UNI-PAK 48 TAB) 10 MG (48) TBPK tablet, Take by mouth daily., Disp: 48 tablet, Rfl: 0 .  Vitamin D, Ergocalciferol, (DRISDOL) 50000 units CAPS capsule, Take 1 capsule (50,000 Units total) by mouth every 7 (seven) days., Disp: 12 capsule, Rfl: 0  Allergies  Allergen Reactions  . Other Other (See Comments)    Any fragrant smell sets her off  . Codeine   . Latex   . Bacitracin-Polymyxin B Rash    redness if left  on for long periods   states no to latex   use paper tape please     ROS  Ten systems reviewed and is negative except as mentioned in HPI   Objective  Vitals:   07/15/16 1539  BP: 104/78  Pulse: (!) 107  Resp: 16  Temp: 98.2 F (36.8 C)  TempSrc: Oral  SpO2: 97%  Weight: 173 lb 2 oz (78.5 kg)  Height: 5\' 1"  (1.549 m)    Body mass index is 32.71 kg/m.  Physical Exam  Constitutional: Patient appears well-developed and well-nourished. Obese  No distress.  HEENT: head atraumatic, normocephalic, pupils equal and reactive to light, ears normal TM, injected conjunctiva,  neck supple, throat within normal limits Cardiovascular: Normal rate, regular rhythm and normal heart sounds.  No  murmur heard. No BLE edema. Pulmonary/Chest: Effort normal and breath sounds normal. No respiratory distress. Abdominal: Soft.  There is no tenderness. Psychiatric: Patient has a normal mood and affect. behavior is normal. Judgment and thought content normal.  PHQ2/9: Depression screen [facility] 2/9 07/15/2016 06/09/2016 01/29/2016 10/23/2015 06/01/2015  Decreased Interest 0 0 0 0 0  Down, Depressed, Hopeless 0 0 0 0 0  PHQ - 2 Score 0 0 0 0 0     Fall Risk: Fall Risk  07/15/2016 06/09/2016 01/29/2016 10/23/2015 06/01/2015  Falls in the past year? No No No No No  Number falls in past yr: - - - 1 -  Injury with Fall? - - - No -    Functional Status Survey: Is the patient deaf or have difficulty hearing?: No Does the patient have difficulty seeing, even when wearing glasses/contacts?: No Does the patient have difficulty concentrating, remembering, or making decisions?: No Does the patient have difficulty walking or climbing stairs?: No Does the patient have difficulty dressing or bathing?: No Does the patient have difficulty doing errands alone such as visiting a doctor's office or shopping?: No   Assessment & Plan  1. Hoarseness  Likely viral, son and husband having cold symptoms  2. Dry cough  Mild over the weekend  3. Other mucopurulent conjunctivitis of both eyes  Advised her to call back if no improvement in 24 hours for referral to eye doctor - ciprofloxacin (CILOXAN) 0.3 % ophthalmic solution; Place 2 drops into both eyes every 2 (two) hours. Administer 1 drop, every 2 hours, while awake, for 2 days. Then 1 drop, every 4 hours, while awake, for the next 5 days.  Dispense: 5 mL; Refill: 0

## 2016-08-01 ENCOUNTER — Encounter: Payer: Self-pay | Admitting: Family Medicine

## 2016-08-01 ENCOUNTER — Ambulatory Visit (INDEPENDENT_AMBULATORY_CARE_PROVIDER_SITE_OTHER): Payer: 59 | Admitting: Family Medicine

## 2016-08-01 VITALS — BP 118/66 | HR 110 | Temp 97.8°F | Resp 16 | Wt 173.4 lb

## 2016-08-01 DIAGNOSIS — Z9884 Bariatric surgery status: Secondary | ICD-10-CM

## 2016-08-01 DIAGNOSIS — Z862 Personal history of diseases of the blood and blood-forming organs and certain disorders involving the immune mechanism: Secondary | ICD-10-CM

## 2016-08-01 DIAGNOSIS — M5416 Radiculopathy, lumbar region: Secondary | ICD-10-CM

## 2016-08-01 DIAGNOSIS — E785 Hyperlipidemia, unspecified: Secondary | ICD-10-CM | POA: Diagnosis not present

## 2016-08-01 DIAGNOSIS — E559 Vitamin D deficiency, unspecified: Secondary | ICD-10-CM

## 2016-08-01 DIAGNOSIS — E538 Deficiency of other specified B group vitamins: Secondary | ICD-10-CM

## 2016-08-01 DIAGNOSIS — F330 Major depressive disorder, recurrent, mild: Secondary | ICD-10-CM

## 2016-08-01 DIAGNOSIS — E8881 Metabolic syndrome: Secondary | ICD-10-CM

## 2016-08-01 MED ORDER — CYANOCOBALAMIN 1000 MCG/ML IJ SOLN: 1000.0000 ug | Freq: Once | INTRAMUSCULAR | 0 refills | Status: DC

## 2016-08-01 MED ORDER — CYCLOBENZAPRINE HCL 10 MG PO TABS: 10.0000 mg | ORAL_TABLET | Freq: Three times a day (TID) | ORAL | 0 refills | Status: DC | PRN

## 2016-08-01 MED ORDER — DULOXETINE HCL 30 MG PO CPEP: 30.0000 mg | ORAL_CAPSULE | Freq: Every day | ORAL | 0 refills | Status: DC

## 2016-08-01 MED ORDER — CYANOCOBALAMIN 1000 MCG/ML IJ SOLN
1000.0000 ug | Freq: Once | INTRAMUSCULAR | Status: AC
  Administered 2016-08-01: 1000 ug via INTRAMUSCULAR

## 2016-08-01 NOTE — Progress Notes (Signed)
Name: Ashlee Cruz   MRN: [id number]    DOB: 09/23/1965   Date:08/01/2016       Progress Note  Subjective  Chief Complaint  Chief Complaint  Patient presents with  . Injections    patient is here for her B12 injection  . Back Pain    patient stated that her back is still not doing too well. patient stated that she has been having numbness and radiation going down her left leg. she has already reached out to her surgeon's office but has still not heard anything from their office.    HPI  Left lower back pain: she had back surgery done by Dr. Consuella Lose  in July 2017 and was doing great. However about 2 months ago  she picked up a printer at work when the symptoms started. She was seen by Dr. Kathyrn Sheriff and had steroid injection end of November and symptoms improved a little, baseline pain still 3/10 but goes [id number] with certain movement or activity. She feels stiff at times. Still no bowel or bladder incontinence. She is taking Celebrex and Flexeril, could not tolerate gabapentin. Discussed Cymbalta or Lyrica. We will start with Cymbalta first. She will contact surgeon to continue another injections.   Major Depression Mild Recurrent: taking Lexapro for years now. She has fatigue, seldom has a crying spells, she has occasional anhedonia - but states she is getting better, she denies problems with her focus or ability to work. She is pain and is worried about her back and getting older.  Bariatric Surgery: she stopped taking all supplements, she has a history of B12, D and iron deficiency anemia.She is getting B12 injections  Metabolic Syndrome: we will recheck hgbA1C denies polyphagia, polydipsia or polyuria.   History of iron deficiency : not taking supplements at this time.    Patient Active Problem List   Diagnosis Date Noted  . Fibroid uterus 12/20/2015  . Class 1 obesity 12/13/2015  . History of iron deficiency anemia 10/23/2015  . Left lumbar radiculitis 10/23/2015   . Absence of menstruation 04/26/2015  . B12 deficiency 04/26/2015  . Depression, major, recurrent, mild ([place]) 04/26/2015  . Dyslipidemia 04/26/2015  . Dysmetabolic syndrome [id number]  . Bariatric surgery status 04/26/2015  . H/O: HTN (hypertension) 04/26/2015  . Obesity (BMI 30.0-34.9) 04/26/2015  . Psoriasis 04/26/2015  . Allergic rhinitis, seasonal 04/26/2015  . Vitamin D deficiency 04/26/2015  . Bilateral knee pain 04/26/2015    Past Surgical History:  Procedure Laterality Date  . BARIATRIC SURGERY      No family history on file.  Social History   Social History  . Marital status: Married    Spouse name: N/A  . Number of children: N/A  . Years of education: N/A   Occupational History  . Not on file.   Social History Main Topics  . Smoking status: Never Smoker  . Smokeless tobacco: Never Used  . Alcohol use No  . Drug use: No  . Sexual activity: Yes    Birth control/ protection: None   Other Topics Concern  . Not on file   Social History Narrative  . No narrative on file     Current Outpatient Prescriptions:  .  Apremilast (OTEZLA) 30 MG TABS, Take 1 tablet by mouth 2 (two) times daily. For psoriasis, Disp: , Rfl:  .  celecoxib (CELEBREX) 100 MG capsule, Take 100 mg by mouth 2 (two) times daily., Disp: , Rfl:  .  cyclobenzaprine (FLEXERIL) 10 MG tablet, Take  1 tablet (10 mg total) by mouth 3 (three) times daily as needed for muscle spasms., Disp: 90 tablet, Rfl: 0 .  DULoxetine (CYMBALTA) 30 MG capsule, Take 1 capsule (30 mg total) by mouth daily., Disp: 60 capsule, Rfl: 0 .  omeprazole (PRILOSEC) 20 MG capsule, Take 20 mg by mouth 2 (two) times daily before a meal., Disp: , Rfl:  .  Vitamin D, Ergocalciferol, (DRISDOL) 50000 units CAPS capsule, Take 1 capsule (50,000 Units total) by mouth every 7 (seven) days., Disp: 12 capsule, Rfl: 0  Allergies  Allergen Reactions  . Other Other (See Comments)    Any fragrant smell sets her off  . Codeine   . Latex    . Bacitracin-Polymyxin B Rash    redness if left on for long periods   states no to latex   use paper tape please     ROS  Constitutional: Negative for fever or weight change.  Respiratory: Negative for cough and shortness of breath.   Cardiovascular: Negative for chest pain or palpitations.  Gastrointestinal: Negative for abdominal pain, no bowel changes.  Musculoskeletal: Negative for gait problem or joint swelling.  Skin: Negative for rash.  Neurological: Negative for dizziness or headache.  No other specific complaints in a complete review of systems (except as listed in HPI above).  Objective  Vitals:   08/01/16 0750  BP: 118/66  Pulse: (!) 110  Resp: 16  Temp: 97.8 F (36.6 C)  TempSrc: Oral  SpO2: 97%  Weight: 173 lb 6.4 oz (78.7 kg)    Body mass index is 32.76 kg/m.  Physical Exam  Constitutional: Patient appears well-developed and well-nourished. Obese  No distress.  HEENT: head atraumatic, normocephalic, pupils equal and reactive to light,  neck supple, throat within normal limits Cardiovascular: Normal rate, regular rhythm and normal heart sounds.  No murmur heard. No BLE edema. Pulmonary/Chest: Effort normal and breath sounds normal. No respiratory distress. Abdominal: Soft.  There is no tenderness. Psychiatric: Patient has a normal mood and affect. behavior is normal. Judgment and thought content normal. Muscular Skeletal: mild pain during palpation of left lower back, positive straight leg raise  PHQ2/9: Depression screen [facility] 2/9 07/15/2016 06/09/2016 01/29/2016 10/23/2015 06/01/2015  Decreased Interest 0 0 0 0 0  Down, Depressed, Hopeless 0 0 0 0 0  PHQ - 2 Score 0 0 0 0 0    Fall Risk: Fall Risk  07/15/2016 06/09/2016 01/29/2016 10/23/2015 06/01/2015  Falls in the past year? No No No No No  Number falls in past yr: - - - 1 -  Injury with Fall? - - - No -      Assessment & Plan  1. Left lumbar radiculitis  - DULoxetine (CYMBALTA) 30 MG  capsule; Take 1 capsule (30 mg total) by mouth daily.  Dispense: 60 capsule; Refill: 0 In place of Lexapro to help with pain and also depression  2. B12 deficiency  - Vitamin B12  3. Depression, major, recurrent, mild ([facility])  - DULoxetine (CYMBALTA) 30 MG capsule; Take 1 capsule (30 mg total) by mouth daily.  Dispense: 60 capsule; Refill: 0 In place of Lexapro -we will change slowly from 20 mg of Lexapro to Cymbalta and gradually go up on the dose. Discussed possible side effect  4. Bariatric surgery status  She needs to resume supplementation , advised to stop Celebrex because of risk of GI bleed - COMPLETE METABOLIC PANEL WITH GFR - Comprehensive metabolic panel - Hemoglobin A1c  5. Dysmetabolic syndrome  -  hgbA1C  6. Vitamin D deficiency  - VITAMIN D 25 Hydroxy (Vit-D Deficiency, Fractures)  7. History of iron deficiency anemia l - CBC with Differential/Platelet  8. Dyslipidemia  - Lipid panel

## 2016-08-01 NOTE — Addendum Note (Signed)
Addended by: Loistine Chance on: 08/01/2016 08:38 AM   Modules accepted: Orders

## 2016-08-05 ENCOUNTER — Other Ambulatory Visit: Payer: Self-pay | Admitting: Neurosurgery

## 2016-08-05 DIAGNOSIS — G8929 Other chronic pain: Secondary | ICD-10-CM

## 2016-08-05 DIAGNOSIS — M5442 Lumbago with sciatica, left side: Principal | ICD-10-CM

## 2016-08-15 ENCOUNTER — Telehealth: Payer: Self-pay | Admitting: Family Medicine

## 2016-08-15 NOTE — Telephone Encounter (Signed)
PT IS NEEDING A REFILL ON CYMBALTA. PT SAYS THAT DR Ancil Boozer WAS GOING TO UP HER MEDICATION TO 60MG . PT IS TAKING 60MG  ONCE A DAY.  PHARM IS [facility] SINCE SHE IS OUT , BUT THEN NEEDS THE REST OF THEM EDUCATION SENT TO [facility]. PLEASE CALL PT BACK.

## 2016-08-15 NOTE — Telephone Encounter (Signed)
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 SHE WAS UPSET SAYING THAT I WAS TRYING TO CUT HER OFF SINCE SHE HAD A PROBLEM. JUST INSURED HER THAT I WOULD MAKE SURE THE NURSE WOULD SEE THIS ENCOUNTER.

## 2016-08-15 NOTE — Telephone Encounter (Signed)
Reviewed note and tried to contact patient to inform her of instructions from note and medication. Left a voicemail on patient's cell phone to call us back regarding dosages. In the note states Cymbalta is in place of Lexapro-we will change slowly from 20 mg of Lexapro to Cymbalta and then gradually go up on the dose. But Dr. Ancil Boozer never specified what dosage to go up on the Cymbalta too. Dr. Ancil Boozer wrote the prescription on 08/01/16 of Cymbalta 30 mg one capsule daily with quantity of 60. Suanne Marker tried to explain to patient Dr. Ancil Boozer is not in the office but will send to nurse Chrishana Spargur to read messages as a high priority when she can. Will you please explain if this patient should be on one capsule daily or two of the 30 mg. Also is she suppose to follow up after changing this dose? The next appointment is not until November 01, 2015. Patient was very agitated with Suanne Marker about her medication getting low and needing a refill.

## 2016-08-16 NOTE — Telephone Encounter (Signed)
One capsule daily for the first week followed by two capsules daily after that. We will send rx of 60 mg once she starts taking 2 capsules daily without side effects

## 2016-08-19 ENCOUNTER — Other Ambulatory Visit: Payer: Self-pay | Admitting: Family Medicine

## 2016-08-19 DIAGNOSIS — M5416 Radiculopathy, lumbar region: Secondary | ICD-10-CM

## 2016-08-19 DIAGNOSIS — F330 Major depressive disorder, recurrent, mild: Secondary | ICD-10-CM

## 2016-08-19 MED ORDER — DULOXETINE HCL 60 MG PO CPEP: 60.0000 mg | ORAL_CAPSULE | Freq: Every day | ORAL | 1 refills | Status: DC

## 2016-08-20 ENCOUNTER — Encounter: Payer: Self-pay | Admitting: Family Medicine

## 2016-08-20 ENCOUNTER — Other Ambulatory Visit: Payer: Self-pay | Admitting: Family Medicine

## 2016-08-20 NOTE — Telephone Encounter (Signed)
Per the request of Dr. Steele Sizer, I contacted [facility] to make sure that this patient had enough medicine until her supply came in from the mail order. I spoke to [place], the pharmacists, and she stated that she just had the 30mg  filled but that she could do a new order for 60mg  to bridge the gap even if she has to pay cash, but that she would take care of this issue.

## 2016-08-20 NOTE — Telephone Encounter (Signed)
done

## 2016-08-21 ENCOUNTER — Other Ambulatory Visit: Payer: Self-pay | Admitting: Family Medicine

## 2016-08-21 LAB — COMPREHENSIVE METABOLIC PANEL
ALT: 26 IU/L (ref 0–32)
AST: 22 IU/L (ref 0–40)
Albumin/Globulin Ratio: 2 (ref 1.2–2.2)
Albumin: 4.5 g/dL (ref 3.5–5.5)
Alkaline Phosphatase: 125 IU/L — ABNORMAL HIGH (ref 39–117)
BILIRUBIN TOTAL: 0.9 mg/dL (ref 0.0–1.2)
BUN/Creatinine Ratio: 15 (ref 9–23)
BUN: 10 mg/dL (ref 6–24)
CHLORIDE: 99 mmol/L (ref 96–106)
CO2: 25 mmol/L (ref 18–29)
Calcium: 9.6 mg/dL (ref 8.7–10.2)
Creatinine, Ser: 0.66 mg/dL (ref 0.57–1.00)
GFR calc Af Amer: 120 mL/min/{1.73_m2} (ref >59)
GFR calc non Af Amer: 104 mL/min/{1.73_m2} (ref >59)
GLUCOSE: 85 mg/dL (ref 65–99)
Globulin, Total: 2.3 g/dL (ref 1.5–4.5)
Potassium: 4.6 mmol/L (ref 3.5–5.2)
Sodium: 143 mmol/L (ref 134–144)
Total Protein: 6.8 g/dL (ref 6.0–8.5)

## 2016-08-21 LAB — CBC WITH DIFFERENTIAL/PLATELET
BASOS ABS: 0 10*3/uL (ref 0.0–0.2)
Basos: 0 %
EOS (ABSOLUTE): 0.2 10*3/uL (ref 0.0–0.4)
Eos: 3 %
Hematocrit: 42.9 % (ref 34.0–46.6)
Hemoglobin: 14.4 g/dL (ref 11.1–15.9)
Immature Grans (Abs): 0 10*3/uL (ref 0.0–0.1)
Immature Granulocytes: 0 %
LYMPHS ABS: 1.2 10*3/uL (ref 0.7–3.1)
LYMPHS: 15 %
MCH: 29.4 pg (ref 26.6–33.0)
MCHC: 33.6 g/dL (ref 31.5–35.7)
MCV: 88 fL (ref 79–97)
Monocytes Absolute: 0.6 10*3/uL (ref 0.1–0.9)
Monocytes: 7 %
NEUTROS ABS: 5.9 10*3/uL (ref 1.4–7.0)
Neutrophils: 75 %
PLATELETS: 349 10*3/uL (ref 150–379)
RBC: 4.9 x10E6/uL (ref 3.77–5.28)
RDW: 12.8 % (ref 12.3–15.4)
WBC: 8 10*3/uL (ref 3.4–10.8)

## 2016-08-21 LAB — VITAMIN D 25 HYDROXY (VIT D DEFICIENCY, FRACTURES): Vit D, 25-Hydroxy: 15.4 ng/mL — ABNORMAL LOW (ref 30.0–100.0)

## 2016-08-21 LAB — FERRITIN: Ferritin: 17 ng/mL (ref 15–150)

## 2016-08-21 LAB — LIPID PANEL
Chol/HDL Ratio: 2.3 ratio units (ref 0.0–4.4)
Cholesterol, Total: 249 mg/dL — ABNORMAL HIGH (ref 100–199)
HDL: 108 mg/dL (ref >39)
LDL Calculated: 124 mg/dL — ABNORMAL HIGH (ref 0–99)
TRIGLYCERIDES: 85 mg/dL (ref 0–149)
VLDL CHOLESTEROL CAL: 17 mg/dL (ref 5–40)

## 2016-08-21 LAB — HEMOGLOBIN A1C
ESTIMATED AVERAGE GLUCOSE: 114 mg/dL
Hgb A1c MFr Bld: 5.6 % (ref 4.8–5.6)

## 2016-08-21 LAB — VITAMIN B12: VITAMIN B 12: 407 pg/mL (ref 232–1245)

## 2016-08-21 MED ORDER — VITAMIN D (ERGOCALCIFEROL) 1.25 MG (50000 UNIT) PO CAPS: 50000.0000 [IU] | ORAL_CAPSULE | ORAL | 0 refills | Status: DC

## 2016-08-22 ENCOUNTER — Ambulatory Visit
Admission: RE | Admit: 2016-08-22 | Discharge: 2016-08-22 | Disposition: A | Discharge status: Home / Self Care | Payer: 59 | Source: Ambulatory Visit | Attending: Neurosurgery | Admitting: Neurosurgery

## 2016-08-22 DIAGNOSIS — M5442 Lumbago with sciatica, left side: Principal | ICD-10-CM

## 2016-08-22 DIAGNOSIS — G8929 Other chronic pain: Secondary | ICD-10-CM

## 2016-08-22 MED ORDER — METHYLPREDNISOLONE ACETATE 40 MG/ML INJ SUSP (RADIOLOG
120.0000 mg | Freq: Once | INTRAMUSCULAR | Status: AC
  Administered 2016-08-22: 120 mg via EPIDURAL

## 2016-08-22 MED ORDER — IOPAMIDOL (ISOVUE-M 200) INJECTION 41%
1.0000 mL | Freq: Once | INTRAMUSCULAR | Status: AC
  Administered 2016-08-22: 1 mL via EPIDURAL

## 2016-09-01 ENCOUNTER — Ambulatory Visit: Payer: 59

## 2016-09-02 ENCOUNTER — Ambulatory Visit: Payer: 59

## 2016-10-10 ENCOUNTER — Other Ambulatory Visit: Payer: Self-pay | Admitting: Family Medicine

## 2016-10-10 DIAGNOSIS — M5416 Radiculopathy, lumbar region: Secondary | ICD-10-CM

## 2016-10-10 NOTE — Telephone Encounter (Signed)
Patient requesting refill of Cyclobenzaprine to [place].

## 2016-10-31 ENCOUNTER — Ambulatory Visit: Payer: 59 | Admitting: Family Medicine

## 2016-11-14 IMAGING — MR MR LUMBAR SPINE W/O CM
4 of 5 series · 15 of 48 positions shown · non-contrast
Comparison: None.

CLINICAL DATA: Low back pain for 6 months.

EXAM:
MRI LUMBAR SPINE WITHOUT CONTRAST
TECHNIQUE: Multiplanar, multisequence MR imaging of the lumbar spine was
performed. No intravenous contrast was administered.

[Series 2: T2 · sagittal · 4.0mm · 0.44mm/px · 6 of 15 slices shown (1 of 2)]
[im 1/15]
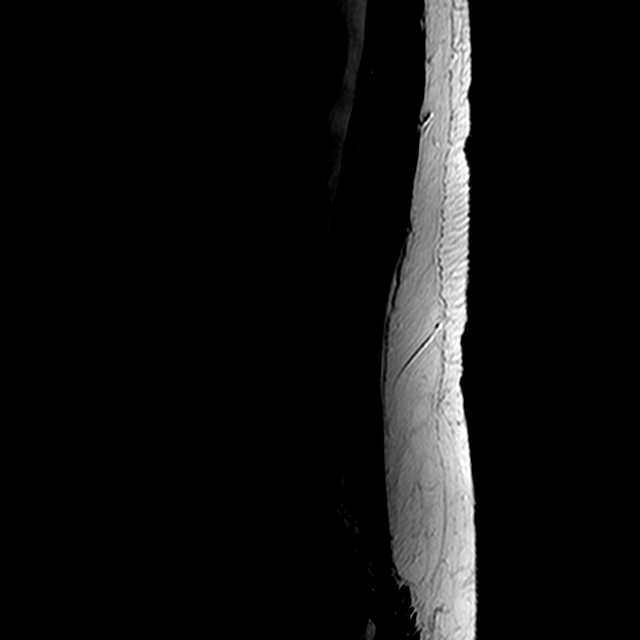
[im 3/15]
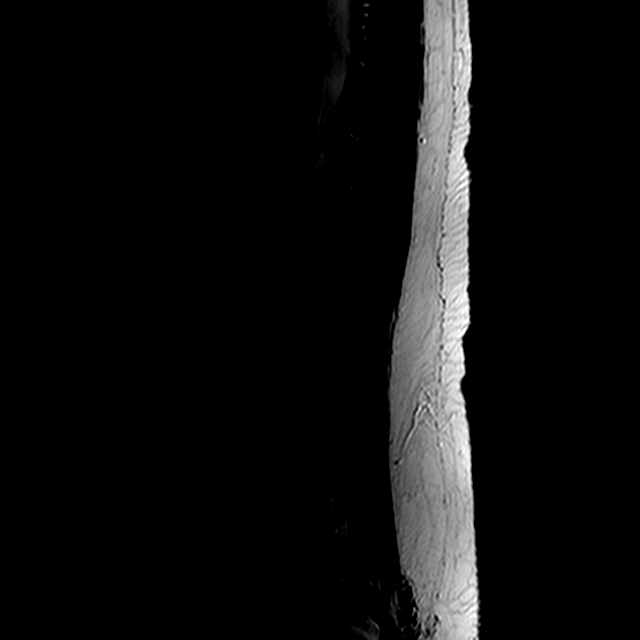
[im 6/15]
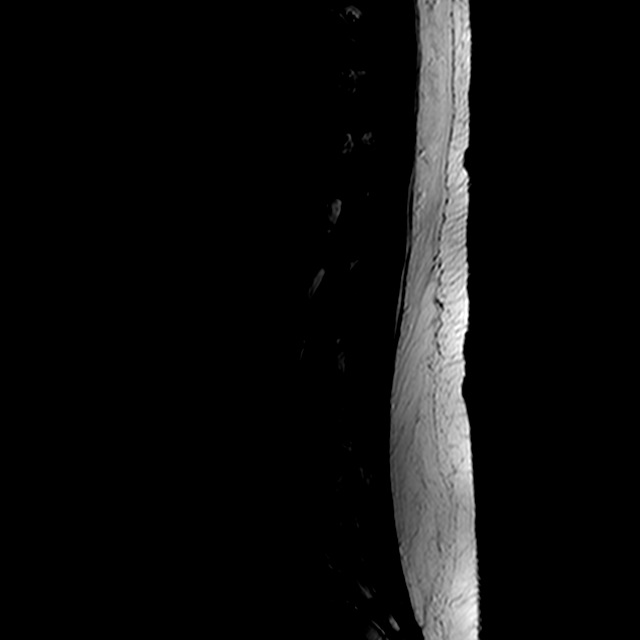
[im 9/15]
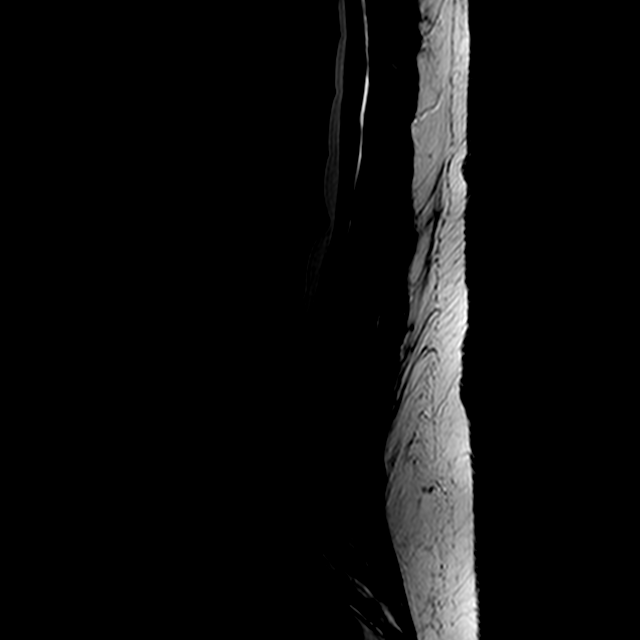
[im 12/15]
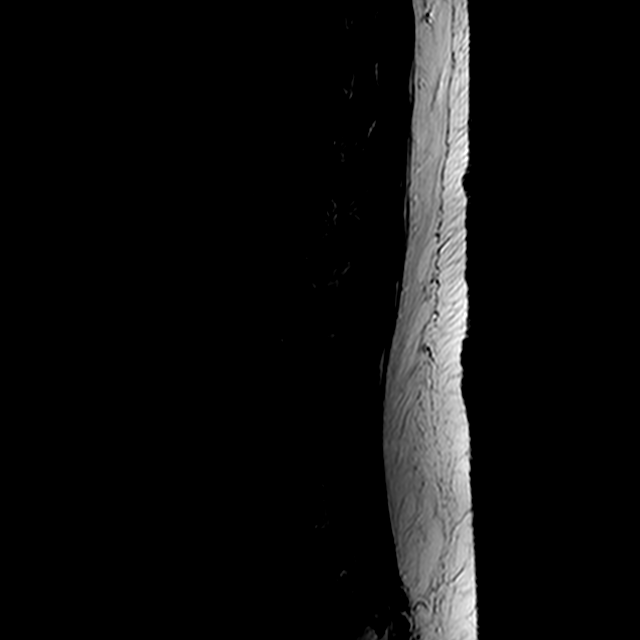
[im 15/15]
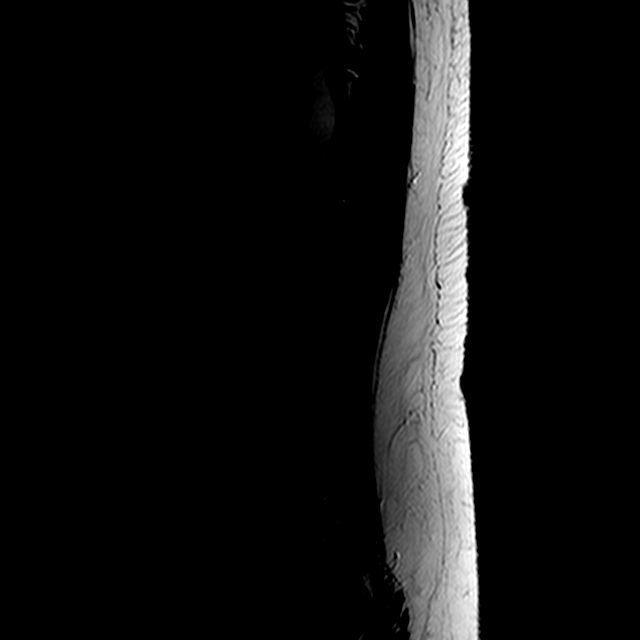

[Series 3: T1 · sagittal · 4.0mm · 0.44mm/px · 3 of 15 slices shown (1 of 2)]
[im 3/15]
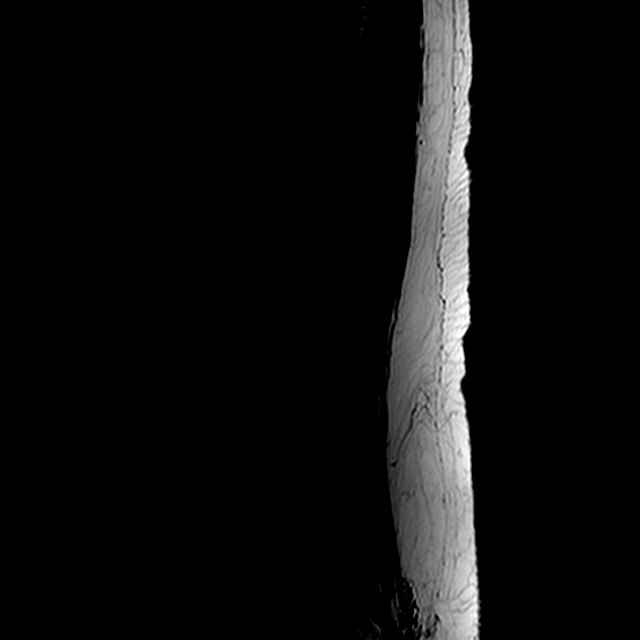
[im 9/15]
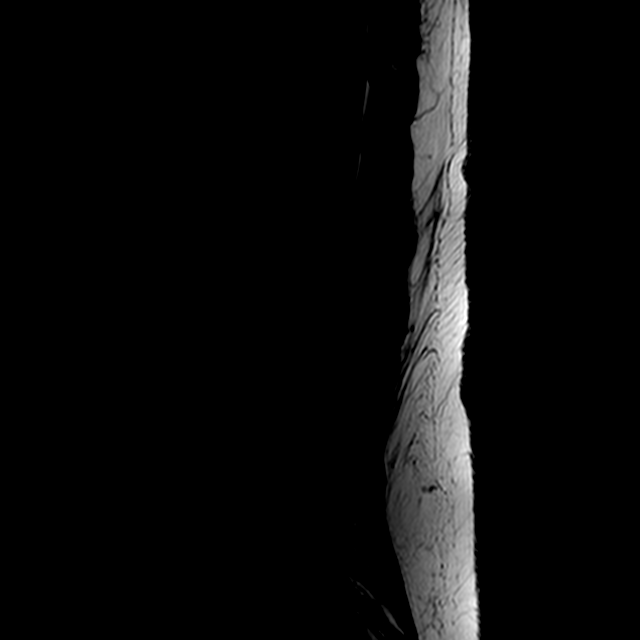
[im 15/15]
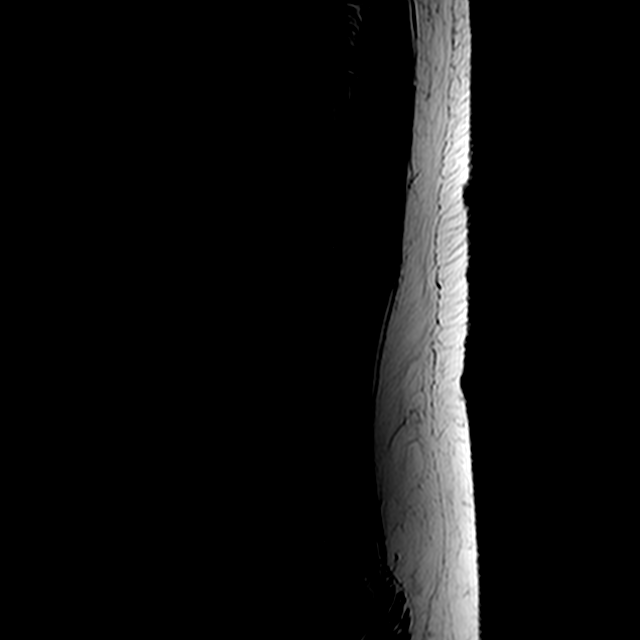

[Series 5: T2 · axial · 4.0mm · 0.39mm/px · z∈[-71,+79]mm · 3 of 36 slices shown (2 of 2)]
[im 6/36]
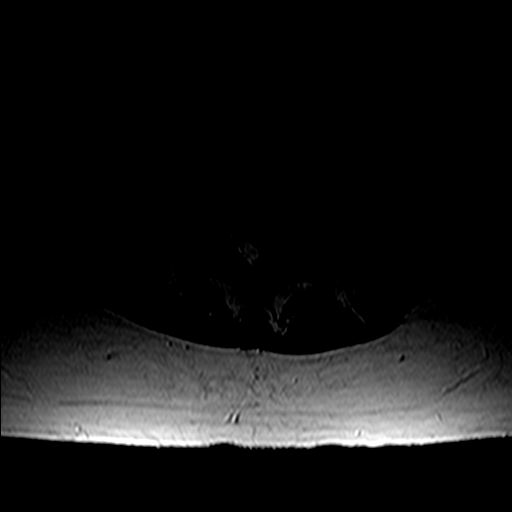
[im 18/36]
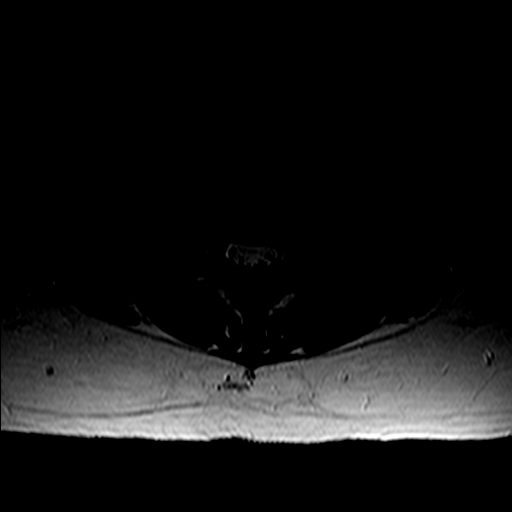
[im 31/36]
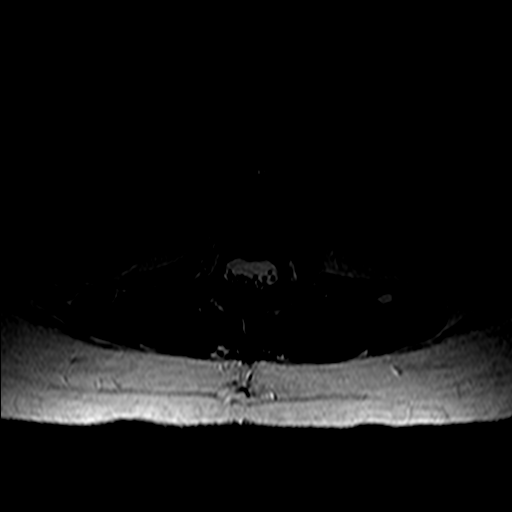

[Series 6: T1 · axial · 4.0mm · 0.39mm/px · z∈[-71,+79]mm · 3 of 36 slices shown (2 of 2)]
[im 6/36]
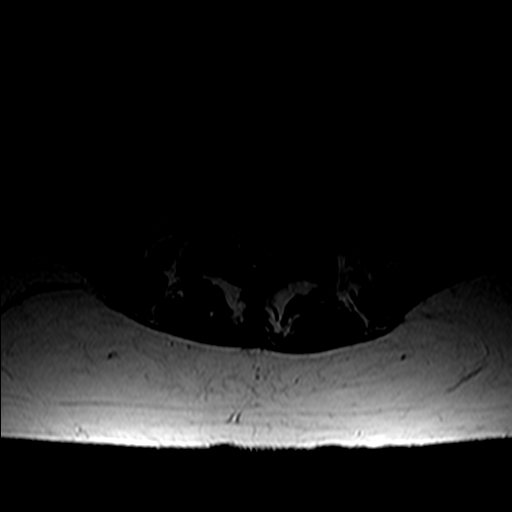
[im 18/36]
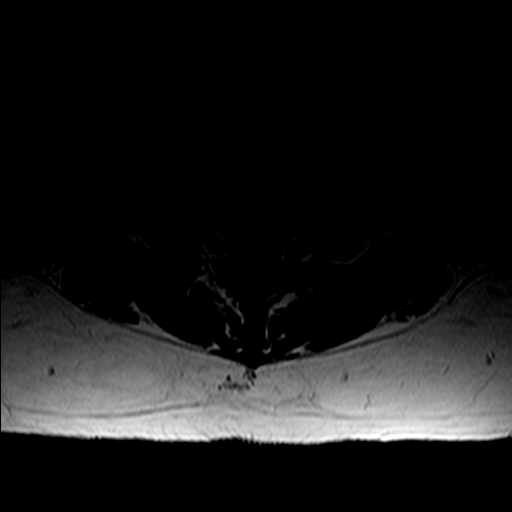
[im 31/36]
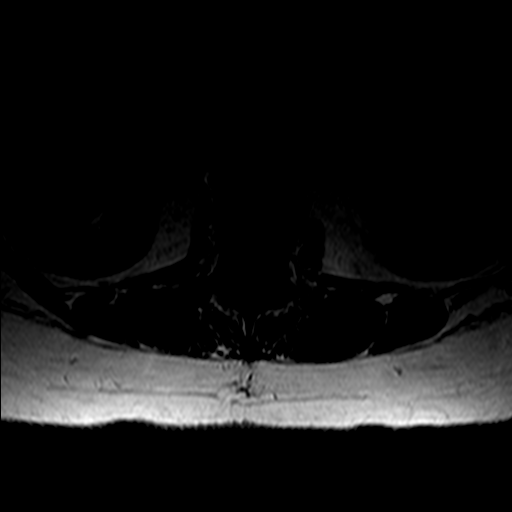

[15 of 48 positions shown; findings below may reference images not displayed]

FINDINGS: Segmentation:  Standard.

Alignment:  Physiologic.

Vertebrae:  No fracture, evidence of discitis, or bone lesion.

Conus medullaris: Extends to the T12 level and appears normal.

Paraspinal and other soft tissues: Negative.

Disc levels:

Disc spaces: Degenerative disc disease with disc height loss at
L1-2. Disc desiccation at L3-4, L4-5 and L5-S1.

T12-L1: No significant disc bulge. No evidence of neural foraminal
stenosis. No central canal stenosis.

L1-L2: Broad central disc protrusion impressing on the ventral
thecal sac. No evidence of neural foraminal stenosis. No central
canal stenosis.

L2-L3: No significant disc bulge. No evidence of neural foraminal
stenosis. No central canal stenosis.

L3-L4: Left lateral annular fissure. Mild left facet arthropathy. No
evidence of neural foraminal stenosis. No central canal stenosis.

L4-L5: Mild broad-based disc bulge. Moderate left facet arthropathy.
Bilateral lateral recess stenosis. No evidence of neural foraminal
stenosis. No central canal stenosis.

L5-S1: Large left paracentral disc extrusion with severe mass effect
on the left intraspinal S1 nerve root and posterior displacement of
the left intraspinal S2 nerve root. No evidence of neural foraminal
stenosis. Overall mild central canal stenosis.
IMPRESSION: 1. At L5-S1 there is a large left paracentral disc extrusion with
severe mass effect on the left intraspinal S1 nerve root and
posterior displacement of the left intraspinal S2 nerve root.
2. At L1-2 there is a broad central disc protrusion impressing on
the ventral thecal sac.

## 2016-12-24 ENCOUNTER — Encounter: Payer: Self-pay | Admitting: Family Medicine

## 2016-12-24 ENCOUNTER — Ambulatory Visit (INDEPENDENT_AMBULATORY_CARE_PROVIDER_SITE_OTHER): Payer: 59 | Admitting: Family Medicine

## 2016-12-24 VITALS — BP 118/68 | HR 114 | Temp 98.0°F | Resp 16 | Ht 61.0 in | Wt 182.6 lb

## 2016-12-24 DIAGNOSIS — E538 Deficiency of other specified B group vitamins: Secondary | ICD-10-CM | POA: Diagnosis not present

## 2016-12-24 DIAGNOSIS — E785 Hyperlipidemia, unspecified: Secondary | ICD-10-CM

## 2016-12-24 DIAGNOSIS — E559 Vitamin D deficiency, unspecified: Secondary | ICD-10-CM | POA: Diagnosis not present

## 2016-12-24 DIAGNOSIS — E8881 Metabolic syndrome: Secondary | ICD-10-CM

## 2016-12-24 DIAGNOSIS — M5416 Radiculopathy, lumbar region: Secondary | ICD-10-CM

## 2016-12-24 DIAGNOSIS — Z9884 Bariatric surgery status: Secondary | ICD-10-CM | POA: Diagnosis not present

## 2016-12-24 DIAGNOSIS — F330 Major depressive disorder, recurrent, mild: Secondary | ICD-10-CM

## 2016-12-24 MED ORDER — DULOXETINE HCL 60 MG PO CPEP: 60.0000 mg | ORAL_CAPSULE | Freq: Every day | ORAL | 1 refills | Status: DC

## 2016-12-24 MED ORDER — CYCLOBENZAPRINE HCL 10 MG PO TABS: ORAL_TABLET | ORAL | 0 refills | Status: DC

## 2016-12-24 MED ORDER — VITAMIN D (ERGOCALCIFEROL) 1.25 MG (50000 UNIT) PO CAPS: 50000.0000 [IU] | ORAL_CAPSULE | ORAL | 0 refills | Status: DC

## 2016-12-24 MED ORDER — GABAPENTIN 100 MG PO CAPS
100.0000 mg | ORAL_CAPSULE | Freq: Every day | ORAL | 0 refills | Status: DC
Start: 2016-12-24 — End: 2017-03-26

## 2016-12-24 MED ORDER — CYANOCOBALAMIN 1000 MCG/ML IJ SOLN
1000.0000 ug | Freq: Once | INTRAMUSCULAR | Status: AC
  Administered 2016-12-24: 1000 ug via INTRAMUSCULAR

## 2016-12-24 NOTE — Progress Notes (Signed)
Name: Ashlee Cruz   MRN: [id number]    DOB: 07-06-1966   Date:12/24/2016       Progress Note  Subjective  Chief Complaint  Chief Complaint  Patient presents with  . Medication Refill    3 month F/U  . Back Pain    Having bilateral legs restless at nights due to the pain, using muscle relaxer for symptoms relief.  . Depression  . Anemia    Not taking any supplements    HPI  Left lower back pain: she had back surgery done by Dr. Consuella Lose in July 2017 and was doing great, but a few months after the surgery she had a flare. She was seen by Dr. Kathyrn Sheriff and had steroid injection end of November and symptoms improved a little, baseline pain still 3/10 but goes 2-2/02 with certain movement or activity, she had another injection January 2018 and pain still goes up but highest pain around 5/10 and still has intermittent numbness on left leg. . She feels stiff when she first gets up in am's. Still no bowel or bladder incontinence. She is taking Celebrex and Flexeril, could not tolerate gabapentin, caused fogginess, but she is willing to resume medication at night, she will titrate up slowly. She states Cymbalta seems to help a little  Major Depression Mild Recurrent:she is now on Cymbalta to help with pain and mood, she states she is struggling because she is always in pain from her back and the pain is also affecting her sleep. She worries about getting old.   Bariatric Surgery: she stopped taking all supplements, she has a history of B12, D and iron deficiency anemia.She stopped getting B12 injections  Metabolic Syndrome: last [id number] was normal at 5.6% now. She denies polyphagia, polydipsia or polyuria.   History of iron deficiency : not taking supplements at this time., but explained the importance of resuming  Patient Active Problem List   Diagnosis Date Noted  . Fibroid uterus 12/20/2015  . Class 1 obesity 12/13/2015  . History of iron deficiency anemia 10/23/2015  . Left  lumbar radiculitis 10/23/2015  . Absence of menstruation 04/26/2015  . B12 deficiency 04/26/2015  . Depression, major, recurrent, mild ([place]) 04/26/2015  . Dyslipidemia 04/26/2015  . Dysmetabolic syndrome 23/76/2831  . Bariatric surgery status 04/26/2015  . H/O: HTN (hypertension) 04/26/2015  . Obesity (BMI 30.0-34.9) 04/26/2015  . Psoriasis 04/26/2015  . Allergic rhinitis, seasonal 04/26/2015  . Vitamin D deficiency 04/26/2015  . Bilateral knee pain 04/26/2015    Past Surgical History:  Procedure Laterality Date  . BARIATRIC SURGERY      No family history on file.  Social History   Social History  . Marital status: Married    Spouse name: N/A  . Number of children: N/A  . Years of education: N/A   Occupational History  . Not on file.   Social History Main Topics  . Smoking status: Never Smoker  . Smokeless tobacco: Never Used  . Alcohol use No  . Drug use: No  . Sexual activity: Yes    Birth control/ protection: None   Other Topics Concern  . Not on file   Social History Narrative  . No narrative on file     Current Outpatient Prescriptions:  .  acetaminophen (TYLENOL) 500 MG tablet, Take 1,000 mg by mouth as needed for moderate pain., Disp: , Rfl:  .  Apremilast (OTEZLA) 30 MG TABS, Take 1 tablet by mouth 2 (two) times daily. For psoriasis, Disp: ,  Rfl:  .  cyclobenzaprine (FLEXERIL) 10 MG tablet, TAKE 1 TABLET BY MOUTH 3 TIMES DAILY AS NEEDED FOR MUSCLE SPASMS, Disp: 90 tablet, Rfl: 0 .  DULoxetine (CYMBALTA) 60 MG capsule, Take 1 capsule (60 mg total) by mouth daily., Disp: 90 capsule, Rfl: 1 .  gabapentin (NEURONTIN) 100 MG capsule, Take 1-3 capsules (100-300 mg total) by mouth at bedtime., Disp: 90 capsule, Rfl: 0 .  omeprazole (PRILOSEC) 20 MG capsule, Take 20 mg by mouth 2 (two) times daily before a meal., Disp: , Rfl:  .  Vitamin D, Ergocalciferol, (DRISDOL) 50000 units CAPS capsule, Take 1 capsule (50,000 Units total) by mouth every 7 (seven) days.,  Disp: 12 capsule, Rfl: 0  Current Facility-Administered Medications:  .  cyanocobalamin ((VITAMIN B-12)) injection 1,000 mcg, 1,000 mcg, Intramuscular, Once, Steele Sizer, MD  Allergies  Allergen Reactions  . Other Other (See Comments)    Any fragrant smell sets her off  . Codeine   . Latex   . Bacitracin-Polymyxin B Rash    redness if left on for long periods   states no to latex   use paper tape please     ROS  Constitutional: Negative for fever, positive forweight change.  Respiratory: Negative for cough and shortness of breath.   Cardiovascular: Negative for chest pain or palpitations.  Gastrointestinal: Negative for abdominal pain, no bowel changes.  Musculoskeletal: Negative for gait problem or joint swelling.  Skin: Positive for rash.  Neurological: Negative for dizziness or headache.  No other specific complaints in a complete review of systems (except as listed in HPI above).  Objective  Vitals:   12/24/16 1118  BP: 118/68  Pulse: (!) 114  Resp: 16  Temp: 98 F (36.7 C)  TempSrc: Oral  SpO2: 97%  Weight: 182 lb 9.6 oz (82.8 kg)  Height: 5\' 1"  (1.549 m)    Body mass index is 34.5 kg/m.  Physical Exam  Constitutional: Patient appears well-developed and well-nourished. Obese  No distress.  HEENT: head atraumatic, normocephalic, pupils equal and reactive to light,  neck supple, throat within normal limits Cardiovascular: Normal rate, regular rhythm and normal heart sounds.  No murmur heard. No BLE edema. Pulmonary/Chest: Effort normal and breath sounds normal. No respiratory distress. Abdominal: Soft.  There is no tenderness. Muscular Skeletal: pain during palpation of left lower back, negative straight raise Psychiatric: Patient has a normal mood and affect. behavior is normal. Judgment and thought content normal.  PHQ2/9: Depression screen [facility] 2/9 12/24/2016 07/15/2016 06/09/2016 01/29/2016 10/23/2015  Decreased Interest 0 0 0 0 0  Down, Depressed,  Hopeless 0 0 0 0 0  PHQ - 2 Score 0 0 0 0 0     Fall Risk: Fall Risk  12/24/2016 07/15/2016 06/09/2016 01/29/2016 10/23/2015  Falls in the past year? Yes No No No No  Number falls in past yr: 1 - - - 1  Injury with Fall? Yes - - - No     Functional Status Survey: Is the patient deaf or have difficulty hearing?: No Does the patient have difficulty seeing, even when wearing glasses/contacts?: No Does the patient have difficulty concentrating, remembering, or making decisions?: No Does the patient have difficulty walking or climbing stairs?: No Does the patient have difficulty dressing or bathing?: No Does the patient have difficulty doing errands alone such as visiting a doctor's office or shopping?: No    Assessment & Plan  1. Left lumbar radiculitis  - cyclobenzaprine (FLEXERIL) 10 MG tablet; TAKE 1 TABLET BY  MOUTH 3 TIMES DAILY AS NEEDED FOR MUSCLE SPASMS  Dispense: 90 tablet; Refill: 0 - DULoxetine (CYMBALTA) 60 MG capsule; Take 1 capsule (60 mg total) by mouth daily.  Dispense: 90 capsule; Refill: 1 - gabapentin (NEURONTIN) 100 MG capsule; Take 1-3 capsules (100-300 mg total) by mouth at bedtime.  Dispense: 90 capsule; Refill: 0  2. B12 deficiency  - cyanocobalamin ((VITAMIN B-12)) injection 1,000 mcg; Inject 1 mL (1,000 mcg total) into the muscle once.  3. Depression, major, recurrent, mild ([facility])  - DULoxetine (CYMBALTA) 60 MG capsule; Take 1 capsule (60 mg total) by mouth daily.  Dispense: 90 capsule; Refill: 1  4. Bariatric surgery status  Explained importance of supplementation  5. Dysmetabolic syndrome  Discussed importance of resuming her changes  6. Dyslipidemia  stable  7. Vitamin D deficiency  - Vitamin D, Ergocalciferol, (DRISDOL) 50000 units CAPS capsule; Take 1 capsule (50,000 Units total) by mouth every 7 (seven) days.  Dispense: 12 capsule; Refill: 0

## 2017-01-23 ENCOUNTER — Ambulatory Visit (INDEPENDENT_AMBULATORY_CARE_PROVIDER_SITE_OTHER): Payer: 59

## 2017-01-23 DIAGNOSIS — E538 Deficiency of other specified B group vitamins: Secondary | ICD-10-CM

## 2017-01-23 MED ORDER — CYANOCOBALAMIN 1000 MCG/ML IJ SOLN
1000.0000 ug | Freq: Once | INTRAMUSCULAR | Status: AC
  Administered 2017-01-23: 1000 ug via INTRAMUSCULAR

## 2017-02-10 LAB — HM MAMMOGRAPHY: HM MAMMO: NORMAL (ref 0–4)

## 2017-02-11 ENCOUNTER — Encounter: Payer: Self-pay | Admitting: Family Medicine

## 2017-02-20 ENCOUNTER — Ambulatory Visit (INDEPENDENT_AMBULATORY_CARE_PROVIDER_SITE_OTHER): Payer: 59

## 2017-02-20 DIAGNOSIS — E538 Deficiency of other specified B group vitamins: Secondary | ICD-10-CM

## 2017-02-20 MED ORDER — CYANOCOBALAMIN 1000 MCG/ML IJ SOLN
1000.0000 ug | Freq: Once | INTRAMUSCULAR | Status: AC
  Administered 2017-02-20: 1000 ug via INTRAMUSCULAR

## 2017-03-26 ENCOUNTER — Encounter: Payer: Self-pay | Admitting: Family Medicine

## 2017-03-26 ENCOUNTER — Ambulatory Visit (INDEPENDENT_AMBULATORY_CARE_PROVIDER_SITE_OTHER): Payer: 59 | Admitting: Family Medicine

## 2017-03-26 VITALS — BP 99/66 | HR 148 | Temp 97.8°F | Resp 16 | Ht 61.0 in | Wt 184.2 lb

## 2017-03-26 DIAGNOSIS — E8881 Metabolic syndrome: Secondary | ICD-10-CM | POA: Diagnosis not present

## 2017-03-26 DIAGNOSIS — R000 Tachycardia, unspecified: Secondary | ICD-10-CM | POA: Diagnosis not present

## 2017-03-26 DIAGNOSIS — E559 Vitamin D deficiency, unspecified: Secondary | ICD-10-CM

## 2017-03-26 DIAGNOSIS — Z9884 Bariatric surgery status: Secondary | ICD-10-CM

## 2017-03-26 DIAGNOSIS — E785 Hyperlipidemia, unspecified: Secondary | ICD-10-CM | POA: Diagnosis not present

## 2017-03-26 DIAGNOSIS — Z79899 Other long term (current) drug therapy: Secondary | ICD-10-CM | POA: Diagnosis not present

## 2017-03-26 DIAGNOSIS — Z862 Personal history of diseases of the blood and blood-forming organs and certain disorders involving the immune mechanism: Secondary | ICD-10-CM

## 2017-03-26 DIAGNOSIS — E538 Deficiency of other specified B group vitamins: Secondary | ICD-10-CM

## 2017-03-26 DIAGNOSIS — I959 Hypotension, unspecified: Secondary | ICD-10-CM

## 2017-03-26 DIAGNOSIS — F330 Major depressive disorder, recurrent, mild: Secondary | ICD-10-CM

## 2017-03-26 DIAGNOSIS — M5416 Radiculopathy, lumbar region: Secondary | ICD-10-CM | POA: Diagnosis not present

## 2017-03-26 MED ORDER — PREDNISONE 10 MG (48) PO TBPK: ORAL_TABLET | ORAL | 0 refills | Status: DC

## 2017-03-26 MED ORDER — CYCLOBENZAPRINE HCL 10 MG PO TABS: ORAL_TABLET | ORAL | 0 refills | Status: DC

## 2017-03-26 MED ORDER — VITAMIN D (ERGOCALCIFEROL) 1.25 MG (50000 UNIT) PO CAPS: 50000.0000 [IU] | ORAL_CAPSULE | ORAL | 0 refills | Status: DC

## 2017-03-26 MED ORDER — CYANOCOBALAMIN 1000 MCG/ML IJ SOLN
1000.0000 ug | Freq: Once | INTRAMUSCULAR | Status: AC
  Administered 2017-03-26: 1000 ug via INTRAMUSCULAR

## 2017-03-26 MED ORDER — GABAPENTIN 300 MG PO CAPS: 300.0000 mg | ORAL_CAPSULE | Freq: Every day | ORAL | 1 refills | Status: DC

## 2017-03-26 MED ORDER — OMEPRAZOLE 40 MG PO CPDR: 40.0000 mg | DELAYED_RELEASE_CAPSULE | Freq: Every day | ORAL | 0 refills | Status: DC

## 2017-03-26 NOTE — Progress Notes (Signed)
Name: Ashlee Cruz   MRN: [id number]    DOB: April 26, 1966   Date:03/26/2017       Progress Note  Subjective  Chief Complaint  Chief Complaint  Patient presents with  . Medication Refill    3 month F/U  . Back Pain    Had a recent slip on her deck last Saturday and her back has flaired up since.   . Depression    HPI  Left lower back pain: she had back surgery done by Dr. Consuella Lose in July 2017 and was doing great, but a few months after the surgery she had a flare. She was seen by Dr. Kathyrn Sheriff and had steroid injection end of November and symptoms improved a little, she had another injection January 2018 and pain average was 3-4/10 until last week when she slipped on her back porch on her left foot and twisted her back in the process without falling. Pain has been up to a 7/10, taking Flexeril and some Advil ( Explained not safe because of gastric bypass) , pain is now 5/10, but still goes down left lower leg. Taking gabapentin at night. Having difficulty bending forward, twisting her back and if sitting or standing for a long time. No bowel or bladder incontinence  Major Depression Mild Recurrent:she is now on Cymbalta to help with pain and mood, she states she is struggling recently because friend lost her husband, and is the anniversary of death of her brother. She worries about getting old and dying. She denies side effects of Cymbalta    Bariatric Surgery: she stopped taking all supplements again, explained importance of compliance with supplements, she has a history of B12, D and iron deficiency anemia.She is getting B12 injections today   Metabolic Syndrome: last PXTG6Y has been normal.  She denies polyphagia, polydipsia or polyuria. Weight has been stable  History of iron deficiency : not taking supplements at this time., we will recheck labs  Tachycardia: she used to take beta-blocker, but bp dropped so she has been off for years. Recently noticing pulse is high at  home, today was 162 on arrival down 140's after rest, seen by cardiologist in the past ( many years), we will refer her back and get labs.   Patient Active Problem List   Diagnosis Date Noted  . Fibroid uterus 12/20/2015  . Class 1 obesity 12/13/2015  . History of iron deficiency anemia 10/23/2015  . Left lumbar radiculitis 10/23/2015  . Absence of menstruation 04/26/2015  . B12 deficiency 04/26/2015  . Depression, major, recurrent, mild ([place]) 04/26/2015  . Dyslipidemia 04/26/2015  . Dysmetabolic syndrome 69/48/5462  . Bariatric surgery status 04/26/2015  . H/O: HTN (hypertension) 04/26/2015  . Obesity (BMI 30.0-34.9) 04/26/2015  . Psoriasis 04/26/2015  . Allergic rhinitis, seasonal 04/26/2015  . Vitamin D deficiency 04/26/2015  . Bilateral knee pain 04/26/2015    Past Surgical History:  Procedure Laterality Date  . BARIATRIC SURGERY    . CHOLECYSTECTOMY    . DILATION AND CURETTAGE OF UTERUS      Family History  Problem Relation Age of Onset  . Diabetes Mother   . Diabetes Father   . COPD Father   . Seizures Father   . Diabetes Brother   . Multiple sclerosis Brother     Social History   Social History  . Marital status: Married    Spouse name: N/A  . Number of children: N/A  . Years of education: N/A   Occupational History  .  Not on file.   Social History Main Topics  . Smoking status: Never Smoker  . Smokeless tobacco: Never Used  . Alcohol use No  . Drug use: No  . Sexual activity: Yes    Birth control/ protection: None   Other Topics Concern  . Not on file   Social History Narrative  . No narrative on file     Current Outpatient Prescriptions:  .  Apremilast (OTEZLA) 30 MG TABS, Take 1 tablet by mouth 2 (two) times daily. For psoriasis, Disp: , Rfl:  .  cyclobenzaprine (FLEXERIL) 10 MG tablet, TAKE 1 TABLET BY MOUTH 3 TIMES DAILY AS NEEDED FOR MUSCLE SPASMS, Disp: 90 tablet, Rfl: 0 .  DULoxetine (CYMBALTA) 60 MG capsule, Take 1 capsule (60 mg  total) by mouth daily., Disp: 90 capsule, Rfl: 1 .  gabapentin (NEURONTIN) 300 MG capsule, Take 1 capsule (300 mg total) by mouth at bedtime., Disp: 90 capsule, Rfl: 1 .  acetaminophen (TYLENOL) 500 MG tablet, Take 1,000 mg by mouth as needed for moderate pain., Disp: , Rfl:  .  omeprazole (PRILOSEC) 40 MG capsule, Take 1 capsule (40 mg total) by mouth daily., Disp: 30 capsule, Rfl: 0 .  predniSONE (STERAPRED UNI-PAK 48 TAB) 10 MG (48) TBPK tablet, Take as directed, Disp: 48 tablet, Rfl: 0 .  Vitamin D, Ergocalciferol, (DRISDOL) 50000 units CAPS capsule, Take 1 capsule (50,000 Units total) by mouth every 7 (seven) days., Disp: 12 capsule, Rfl: 0  Allergies  Allergen Reactions  . Other Other (See Comments)    Any fragrant smell sets her off  . Codeine   . Latex   . Bacitracin-Polymyxin B Rash    redness if left on for long periods   states no to latex   use paper tape please     ROS  Constitutional: Negative for fever or weight change.  Respiratory: Negative for cough and shortness of breath.   Cardiovascular: Negative for chest pain or palpitations.  Gastrointestinal: Negative for abdominal pain, no bowel changes.  Musculoskeletal: Negative for gait problem or joint swelling.  Skin: Negative for rash.  Neurological: Negative for dizziness or headache.  No other specific complaints in a complete review of systems (except as listed in HPI above).  Objective  Vitals:   03/26/17 0902  BP: 99/66  Pulse: (!) 148  Resp: 16  Temp: 97.8 F (36.6 C)  TempSrc: Oral  SpO2: 97%  Weight: 184 lb 3.2 oz (83.6 kg)  Height: 5\' 1"  (1.549 m)    Body mass index is 34.8 kg/m.  Physical Exam  Constitutional: Patient appears well-developed and well-nourished. Obese  No distress.  HEENT: head atraumatic, normocephalic, pupils equal and reactive to light, neck supple, throat within normal limits Cardiovascular: Increase in rate, normal rhythm and normal heart sounds.  No murmur heard. No BLE  edema. Pulmonary/Chest: Effort normal and breath sounds normal. No respiratory distress. Abdominal: Soft.  There is no tenderness. Muscular Skeletal: pain during palpation, negative straight leg raise , pain with rom of spine., worse on left lower back  Psychiatric: Patient has a normal mood and affect. behavior is normal. Judgment and thought content normal.  Recent Results (from the past 2160 hour(s))  HM MAMMOGRAPHY     Status: None   Collection Time: 02/10/17 12:00 AM  Result Value Ref Range   HM Mammogram Self Reported Normal 0-4 Bi-Rad, Self Reported Normal    Comment: [facility], Negative, 1 year follow up Dr. Faythe Ghee, DO  PHQ2/9: Depression screen [facility] 2/9 03/26/2017 12/24/2016 07/15/2016 06/09/2016 01/29/2016  Decreased Interest 1 0 0 0 0  Down, Depressed, Hopeless 0 0 0 0 0  PHQ - 2 Score 1 0 0 0 0     Fall Risk: Fall Risk  03/26/2017 12/24/2016 07/15/2016 06/09/2016 01/29/2016  Falls in the past year? Yes Yes No No No  Comment - - - - -  Number falls in past yr: 1 1 - - -  Injury with Fall? Yes Yes - - -  Comment - Little Toe on Right Foot - - -     Functional Status Survey: Is the patient deaf or have difficulty hearing?: No Does the patient have difficulty seeing, even when wearing glasses/contacts?: No Does the patient have difficulty concentrating, remembering, or making decisions?: No Does the patient have difficulty walking or climbing stairs?: No Does the patient have difficulty dressing or bathing?: No Does the patient have difficulty doing errands alone such as visiting a doctor's office or shopping?: No    Assessment & Plan  1. Left lumbar radiculitis  - predniSONE (STERAPRED UNI-PAK 48 TAB) 10 MG (48) TBPK tablet; Take as directed  Dispense: 48 tablet; Refill: 0 - cyclobenzaprine (FLEXERIL) 10 MG tablet; TAKE 1 TABLET BY MOUTH 3 TIMES DAILY AS NEEDED FOR MUSCLE SPASMS  Dispense: 90 tablet; Refill: 0 - gabapentin (NEURONTIN) 300 MG capsule; Take 1  capsule (300 mg total) by mouth at bedtime.  Dispense: 90 capsule; Refill: 1  2. B12 deficiency  - cyanocobalamin ((VITAMIN B-12)) injection 1,000 mcg; Inject 1 mL (1,000 mcg total) into the muscle once. - Vitamin B12  3. Depression, major, recurrent, mild ([facility])  Doing okay, stable, sad about friend that lost her husband  4. Bariatric surgery status   5. Dysmetabolic syndrome  - Hemoglobin A1c - Insulin, 2 Hour  6. Dyslipidemia  - Lipid panel  7. History of iron deficiency anemia  - CBC with Differential/Platelet  8. Vitamin D deficiency  - VITAMIN D 25 Hydroxy (Vit-D Deficiency, Fractures) - Vitamin D, Ergocalciferol, (DRISDOL) 50000 units CAPS capsule; Take 1 capsule (50,000 Units total) by mouth every 7 (seven) days.  Dispense: 12 capsule; Refill: 0  9. Tachycardia  - TSH - Ambulatory referral to Cardiology - EKG: sinus tachycardia  10. Long-term use of high-risk medication  - Comp. Metabolic Panel (12) - omeprazole (PRILOSEC) 40 MG capsule; Take 1 capsule (40 mg total) by mouth daily.  Dispense: 30 capsule; Refill: 0  11. Hypotension, unspecified hypotension type  - Ambulatory referral to Cardiology

## 2017-03-30 LAB — COMP. METABOLIC PANEL (12)
A/G RATIO: 2 (ref 1.2–2.2)
ALBUMIN: 4.7 g/dL (ref 3.5–5.5)
AST: 17 IU/L (ref 0–40)
Alkaline Phosphatase: 109 IU/L (ref 39–117)
BUN / CREAT RATIO: 14 (ref 9–23)
BUN: 10 mg/dL (ref 6–24)
Bilirubin Total: 0.6 mg/dL (ref 0.0–1.2)
CALCIUM: 9.7 mg/dL (ref 8.7–10.2)
Chloride: 103 mmol/L (ref 96–106)
Creatinine, Ser: 0.73 mg/dL (ref 0.57–1.00)
GFR, EST AFRICAN AMERICAN: 112 mL/min/{1.73_m2} (ref >59)
GFR, EST NON AFRICAN AMERICAN: 97 mL/min/{1.73_m2} (ref >59)
GLOBULIN, TOTAL: 2.4 g/dL (ref 1.5–4.5)
GLUCOSE: 92 mg/dL (ref 65–99)
POTASSIUM: 4.4 mmol/L (ref 3.5–5.2)
SODIUM: 143 mmol/L (ref 134–144)
Total Protein: 7.1 g/dL (ref 6.0–8.5)

## 2017-03-30 LAB — CBC WITH DIFFERENTIAL/PLATELET
Basophils Absolute: 0 10*3/uL (ref 0.0–0.2)
Basos: 0 %
EOS (ABSOLUTE): 0.1 10*3/uL (ref 0.0–0.4)
Eos: 1 %
HEMATOCRIT: 42 % (ref 34.0–46.6)
Hemoglobin: 13.8 g/dL (ref 11.1–15.9)
IMMATURE GRANS (ABS): 0 10*3/uL (ref 0.0–0.1)
IMMATURE GRANULOCYTES: 0 %
LYMPHS ABS: 1.5 10*3/uL (ref 0.7–3.1)
Lymphs: 16 %
MCH: 28.5 pg (ref 26.6–33.0)
MCHC: 32.9 g/dL (ref 31.5–35.7)
MCV: 87 fL (ref 79–97)
Monocytes Absolute: 0.7 10*3/uL (ref 0.1–0.9)
Monocytes: 7 %
NEUTROS ABS: 7 10*3/uL (ref 1.4–7.0)
Neutrophils: 76 %
Platelets: 259 10*3/uL (ref 150–379)
RBC: 4.85 x10E6/uL (ref 3.77–5.28)
RDW: 13.7 % (ref 12.3–15.4)
WBC: 9.2 10*3/uL (ref 3.4–10.8)

## 2017-03-30 LAB — TSH: TSH: 1.9 u[IU]/mL (ref 0.450–4.500)

## 2017-03-30 LAB — LIPID PANEL
CHOLESTEROL TOTAL: 242 mg/dL — AB (ref 100–199)
Chol/HDL Ratio: 2.6 ratio (ref 0.0–4.4)
HDL: 94 mg/dL (ref >39)
LDL CALC: 136 mg/dL — AB (ref 0–99)
Triglycerides: 60 mg/dL (ref 0–149)
VLDL CHOLESTEROL CAL: 12 mg/dL (ref 5–40)

## 2017-03-30 LAB — VITAMIN B12: Vitamin B-12: >2000 pg/mL — ABNORMAL HIGH (ref 232–1245)

## 2017-03-30 LAB — HEMOGLOBIN A1C
ESTIMATED AVERAGE GLUCOSE: 117 mg/dL
Hgb A1c MFr Bld: 5.7 % — ABNORMAL HIGH (ref 4.8–5.6)

## 2017-03-30 LAB — VITAMIN D 25 HYDROXY (VIT D DEFICIENCY, FRACTURES): Vit D, 25-Hydroxy: 18.5 ng/mL — ABNORMAL LOW (ref 30.0–100.0)

## 2017-03-30 LAB — INSULIN, 2 HOUR: Insulin, 2 hour: 19.9 u[IU]/mL (ref 0.0–145.4)

## 2017-04-03 ENCOUNTER — Encounter: Payer: Self-pay | Admitting: Family Medicine

## 2017-04-27 ENCOUNTER — Ambulatory Visit: Payer: 59

## 2017-04-30 ENCOUNTER — Other Ambulatory Visit: Payer: Self-pay | Admitting: Family Medicine

## 2017-04-30 DIAGNOSIS — F330 Major depressive disorder, recurrent, mild: Secondary | ICD-10-CM

## 2017-04-30 DIAGNOSIS — M5416 Radiculopathy, lumbar region: Secondary | ICD-10-CM

## 2017-04-30 NOTE — Telephone Encounter (Signed)
Patient requesting refill of Duloxetine to [facility].

## 2017-05-19 ENCOUNTER — Ambulatory Visit (INDEPENDENT_AMBULATORY_CARE_PROVIDER_SITE_OTHER): Payer: 59 | Admitting: Internal Medicine

## 2017-05-19 ENCOUNTER — Encounter: Payer: Self-pay | Admitting: Internal Medicine

## 2017-05-19 VITALS — BP 110/82 | HR 107 | Ht 61.0 in | Wt 185.2 lb

## 2017-05-19 DIAGNOSIS — R002 Palpitations: Secondary | ICD-10-CM | POA: Diagnosis not present

## 2017-05-19 DIAGNOSIS — R000 Tachycardia, unspecified: Secondary | ICD-10-CM | POA: Insufficient documentation

## 2017-05-19 NOTE — Progress Notes (Signed)
New Outpatient Visit Date: 05/19/2017  Referring Provider: Steele Sizer, [address] [place], [place] [address]  Chief Complaint: Elevated heart rate  HPI:  Ms. Roskelley is a 51 y.o. female who is being seen today for the evaluation of elevated heart rate at the request of Dr. Ancil Boozer. She has a history of hypertension, borderline hyperlipidemia, impaired glucose tolerance, psoriasis, depression, and obesity status post bariatric surgery. His [place] reports a long history of intermittent tachycardia, for which she previously saw Dr. Clayborn Bigness. She reports having undergone a stress test and echo, both of which she believes were normal. No medical therapy was ever started.  Over the last 3-4 months, she has had episodes of "anxiety" during which it seems as though her heart rate is faster than usual. There are no clear precipitants for these episodes. She does not have associated symptoms including chest pain, shortness of breath, and lightheadedness. However, at other times she feels a little bit off balance or notices things spinning around her. She has not passed out. At her visit with Dr. Ancil Boozer last month, Ms. Kulesza was noted to have significant tachycardia with EKG demonstrating a heart rate of 135 bpm However, the time Ms. Pamala Duffel was also in considerable pain secondary to sciatica. Around the time that her episodes of elevated heart rate began, Ms. Pamala Duffel was also placed on gabapentin and prednisone. Over the last few months, Ms. Nakatani has cut down on her caffeine consumption and is now down to 54 ounces of caffeinated soda per day. With this reduction, she has noted some improvement in her tachycardia. At this point, she has episodes of palpitations once a week or less. She used a diet pill (does not recall which agent) in her 57s.  --------------------------------------------------------------------------------------------------  Cardiovascular History &  Procedures: Cardiovascular Problems:  Palpitations with sinus tachycardia  Risk Factors:  Hypertension, hyperlipidemia, and obesity  Cath/PCI:  None  CV Surgery:  None  EP Procedures and Devices:  None  Non-Invasive Evaluation(s):  None available  Recent CV Pertinent Labs: Lab Results  Component Value Date   CHOL 242 (H) 03/27/2017   HDL 94 03/27/2017   LDLCALC 136 (H) 03/27/2017   TRIG 60 03/27/2017   CHOLHDL 2.6 03/27/2017   K 4.4 03/27/2017   K 3.9 10/29/2011   MG 1.7 (L) 10/29/2011   BUN 10 03/27/2017   BUN 6 (L) 10/29/2011   CREATININE 0.73 03/27/2017   CREATININE 0.55 (L) 10/29/2011    --------------------------------------------------------------------------------------------------  Past Medical History:  Diagnosis Date  . Allergic rhinitis   . Depression   . Hyperlipidemia   . Hypertension   . Metabolic syndrome   . Obesity   . Psoriasis     Past Surgical History:  Procedure Laterality Date  . BARIATRIC SURGERY    . CHOLECYSTECTOMY    . DILATION AND CURETTAGE OF UTERUS      Current Meds  Medication Sig  . acetaminophen (TYLENOL) 500 MG tablet Take 1,000 mg by mouth as needed for moderate pain.  Marland Kitchen Apremilast (OTEZLA) 30 MG TABS Take 1 tablet by mouth 2 (two) times daily. For psoriasis  . cyclobenzaprine (FLEXERIL) 10 MG tablet TAKE 1 TABLET BY MOUTH 3 TIMES DAILY AS NEEDED FOR MUSCLE SPASMS  . DULoxetine (CYMBALTA) 60 MG capsule TAKE 1 CAPSULE BY MOUTH  DAILY  . gabapentin (NEURONTIN) 300 MG capsule Take 1 capsule (300 mg total) by mouth at bedtime.  Marland Kitchen omeprazole (PRILOSEC) 40 MG capsule Take 1 capsule (40 mg total)  by mouth daily. (Patient taking differently: Take 40 mg by mouth daily as needed. )    Allergies: Other; Codeine; Latex; and Bacitracin-polymyxin b  Social History   Social History  . Marital status: Married    Spouse name: N/A  . Number of children: N/A  . Years of education: N/A   Occupational History  . Not on  file.   Social History Main Topics  . Smoking status: Never Smoker  . Smokeless tobacco: Never Used  . Alcohol use No  . Drug use: No  . Sexual activity: Yes    Birth control/ protection: None   Other Topics Concern  . Not on file   Social History Narrative  . No narrative on file    Family History  Problem Relation Age of Onset  . Diabetes Mother   . Diabetes Father   . COPD Father   . Seizures Father   . Heart disease Father 11       CABG  . Diabetes Brother   . Multiple sclerosis Brother   . Stroke Paternal Grandmother     Review of Systems: Review of Systems  Constitutional: Negative.   HENT: Negative.   Eyes: Negative.   Respiratory: Negative.   Cardiovascular: Positive for palpitations. Negative for chest pain, orthopnea, leg swelling and PND.  Gastrointestinal: Negative.   Genitourinary: Negative.   Musculoskeletal: Positive for back pain.  Skin: Negative.   Neurological: Positive for dizziness.  Endo/Heme/Allergies: Negative.   Psychiatric/Behavioral: Positive for depression (controlled with Cymbalta).   --------------------------------------------------------------------------------------------------  Physical Exam: BP 110/82 (BP Location: Right Arm, Patient Position: Sitting, Cuff Size: Normal)   Pulse (!) 107   Ht 5\' 1"  (1.549 m)   Wt 185 lb 4 oz (84 kg)   LMP 10/27/2015   BMI 35.00 kg/m   General:  Obese woman, seated comfortably in the exam room. HEENT: No conjunctival pallor or scleral icterus. Moist mucous membranes. OP clear. Neck: Supple without lymphadenopathy, thyromegaly, JVD, or HJR. No carotid bruit. Lungs: Normal work of breathing. Clear to auscultation bilaterally without wheezes or crackles. Heart: Regular rate and rhythm without murmurs, rubs, or gallops. Non-displaced PMI. Abd: Bowel sounds present. Soft, NT/ND without hepatosplenomegaly Ext: No lower extremity edema. Radial, PT, and DP pulses are 2+ bilaterally Skin: Warm and  dry without rash. Neuro: CNIII-XII intact. Strength and fine-touch sensation intact in upper and lower extremities bilaterally. Psych: Normal mood and affect.  EKG:  Sinus tachycardia (HR 107 bpm) with low voltage. Otherwise, no significant abnormalities.  Lab Results  Component Value Date   WBC 9.2 03/27/2017   HGB 13.8 03/27/2017   HCT 42.0 03/27/2017   MCV 87 03/27/2017   PLT 259 03/27/2017    Lab Results  Component Value Date   NA 143 03/27/2017   K 4.4 03/27/2017   CL 103 03/27/2017   CO2 25 08/20/2016   BUN 10 03/27/2017   CREATININE 0.73 03/27/2017   GLUCOSE 92 03/27/2017   ALT 26 08/20/2016    Lab Results  Component Value Date   CHOL 242 (H) 03/27/2017   HDL 94 03/27/2017   LDLCALC 136 (H) 03/27/2017   TRIG 60 03/27/2017   CHOLHDL 2.6 03/27/2017   Lab Results  Component Value Date   TSH 1.900 03/27/2017    --------------------------------------------------------------------------------------------------  ASSESSMENT AND PLAN: Palpitations and sinus tachycardia I suspect the patient's elevated heart rate is multifactorial, including back pain, caffeine consumption, and deconditioning. Inappropriate sinus tachycardia is a consideration as well. Her exam today  is unrevealing other than the sinus tachycardia. EKG today and in August show sinus tach. Labs in August were also unrevealing. We have agreed to obtain an echocardiogram to exclude structural abnormalities. I have encouraged Ms. Brandenburger to further limit her caffeine intake and ultimately stop. If palpitations persist, we will consider placement of an event monitor to exclude paroxysmal arrhythmias. As she is not particularly symptomatic, we will defer adding a medication this time.  Follow-up: Return to clinic in 3 months.  Nelva Bush, MD 05/19/2017 8:21 PM

## 2017-05-19 NOTE — Patient Instructions (Addendum)
Medication Instructions:  Your physician recommends that you continue on your current medications as directed. Please refer to the Current Medication list given to you today.   Labwork: none  Testing/Procedures: Your physician has requested that you have an echocardiogram. Echocardiography is a painless test that uses sound waves to create images of your heart. It provides your doctor with information about the size and shape of your heart and how well your heart's chambers and valves are working. This procedure takes approximately one hour. There are no restrictions for this procedure.    Follow-Up: Your physician recommends that you schedule a follow-up appointment in: 3 MONTHS WITH DR END.   Dr. Saunders Revel requests you decrease your intake of caffeine.    If you need a refill on your cardiac medications before your next appointment, please call your pharmacy.   Echocardiogram An echocardiogram, or echocardiography, uses sound waves (ultrasound) to produce an image of your heart. The echocardiogram is simple, painless, obtained within a short period of time, and offers valuable information to your health care provider. The images from an echocardiogram can provide information such as:  Evidence of coronary artery disease (CAD).  Heart size.  Heart muscle function.  Heart valve function.  Aneurysm detection.  Evidence of a past heart attack.  Fluid buildup around the heart.  Heart muscle thickening.  Assess heart valve function.  Tell a health care provider about:  Any allergies you have.  All medicines you are taking, including vitamins, herbs, eye drops, creams, and over-the-counter medicines.  Any problems you or family members have had with anesthetic medicines.  Any blood disorders you have.  Any surgeries you have had.  Any medical conditions you have.  Whether you are pregnant or may be pregnant. What happens before the procedure? No special preparation is  needed. Eat and drink normally. What happens during the procedure?  In order to produce an image of your heart, gel will be applied to your chest and a wand-like tool (transducer) will be moved over your chest. The gel will help transmit the sound waves from the transducer. The sound waves will harmlessly bounce off your heart to allow the heart images to be captured in real-time motion. These images will then be recorded.  You may need an IV to receive a medicine that improves the quality of the pictures. What happens after the procedure? You may return to your normal schedule including diet, activities, and medicines, unless your health care provider tells you otherwise. This information is not intended to replace advice given to you by your health care provider. Make sure you discuss any questions you have with your health care provider. Document Released: 08/01/2000 Document Revised: 03/22/2016 Document Reviewed: 04/11/2013 Elsevier Interactive Patient Education  2017 [employer].

## 2017-05-27 ENCOUNTER — Other Ambulatory Visit: Payer: Self-pay

## 2017-05-27 ENCOUNTER — Ambulatory Visit (INDEPENDENT_AMBULATORY_CARE_PROVIDER_SITE_OTHER): Payer: 59

## 2017-05-27 DIAGNOSIS — R000 Tachycardia, unspecified: Secondary | ICD-10-CM

## 2017-06-30 ENCOUNTER — Ambulatory Visit: Payer: 59 | Admitting: Family Medicine

## 2017-06-30 ENCOUNTER — Encounter: Payer: Self-pay | Admitting: Family Medicine

## 2017-06-30 VITALS — BP 120/72 | HR 83 | Resp 12 | Ht 61.0 in | Wt 184.0 lb

## 2017-06-30 DIAGNOSIS — Z23 Encounter for immunization: Secondary | ICD-10-CM

## 2017-06-30 DIAGNOSIS — E8881 Metabolic syndrome: Secondary | ICD-10-CM | POA: Diagnosis not present

## 2017-06-30 DIAGNOSIS — Z9884 Bariatric surgery status: Secondary | ICD-10-CM | POA: Diagnosis not present

## 2017-06-30 DIAGNOSIS — E785 Hyperlipidemia, unspecified: Secondary | ICD-10-CM

## 2017-06-30 DIAGNOSIS — Z1211 Encounter for screening for malignant neoplasm of colon: Secondary | ICD-10-CM | POA: Diagnosis not present

## 2017-06-30 DIAGNOSIS — E538 Deficiency of other specified B group vitamins: Secondary | ICD-10-CM | POA: Diagnosis not present

## 2017-06-30 DIAGNOSIS — Z862 Personal history of diseases of the blood and blood-forming organs and certain disorders involving the immune mechanism: Secondary | ICD-10-CM | POA: Diagnosis not present

## 2017-06-30 DIAGNOSIS — M5416 Radiculopathy, lumbar region: Secondary | ICD-10-CM

## 2017-06-30 DIAGNOSIS — F330 Major depressive disorder, recurrent, mild: Secondary | ICD-10-CM | POA: Diagnosis not present

## 2017-06-30 DIAGNOSIS — E559 Vitamin D deficiency, unspecified: Secondary | ICD-10-CM

## 2017-06-30 DIAGNOSIS — R000 Tachycardia, unspecified: Secondary | ICD-10-CM

## 2017-06-30 DIAGNOSIS — M654 Radial styloid tenosynovitis [de Quervain]: Secondary | ICD-10-CM

## 2017-06-30 MED ORDER — WRIST BRACE/RIGHT MEDIUM MISC: 1.0000 | Freq: Every day | 0 refills | Status: DC

## 2017-06-30 MED ORDER — CYANOCOBALAMIN 1000 MCG/ML IJ SOLN
1000.0000 ug | INTRAMUSCULAR | Status: DC
  Administered 2017-06-30 – 2017-11-09 (×2): 1000 ug via SUBCUTANEOUS

## 2017-06-30 MED ORDER — METFORMIN HCL ER 500 MG PO TB24: 500.0000 mg | ORAL_TABLET | Freq: Every day | ORAL | 0 refills | Status: DC

## 2017-06-30 NOTE — Progress Notes (Signed)
Name: Ashlee Cruz   MRN: [id number]    DOB: 31-Oct-1965   Date:06/30/2017       Progress Note  Subjective  Chief Complaint  Chief Complaint  Patient presents with  . Depression  . Medication Refill    HPI  Left lower back pain: she had back surgery done by Dr. Consuella Lose in July 2017 and was doing great, but a few months after the surgery she had a flare. She was seen by Dr. Kathyrn Sheriff and had steroid injection end of November 2017 and symptoms improved a little, she had another injection January 2018 and pain average was 3-4/10 , had a flare back 03/2017, doing well now, taking Flexeril prn, occasionally ibuprofen and explained risk since she had bariatric surgery   Major Depression Mild Recurrent:she is now on Cymbalta to help with pain and mood. She is doing well at this time, except for being upset about her weight.   Bariatric Surgery: she stopped taking all supplements again, explained importance of compliance with supplements, she has a history of B12, D and iron deficiency anemia.She is getting B12 injections monthly. She is willing to resume Metformin to help with insulin resistance.   Metabolic Syndrome: last WCHE5I has been going up, weight also trending up  She denies polyphagia, polydipsia or polyuria. She is frustrated about her weight.   History of iron deficiency : not taking supplements at this time., labs back to normal   Tachycardia: she used to take beta-blocker, but bp dropped so she has been off for years. She was seen by Dr. Saunders Revel and was advised to decrease caffeine intake and exercise more, echo was normal, pain is better controlled and hr is at goal today.    Patient Active Problem List   Diagnosis Date Noted  . Tachycardia 05/19/2017  . Palpitations 05/19/2017  . Fibroid uterus 12/20/2015  . Class 1 obesity 12/13/2015  . History of iron deficiency anemia 10/23/2015  . Left lumbar radiculitis 10/23/2015  . Absence of menstruation 04/26/2015  .  B12 deficiency 04/26/2015  . Depression, major, recurrent, mild ([place]) 04/26/2015  . Dyslipidemia 04/26/2015  . Dysmetabolic syndrome 77/82/4235  . Bariatric surgery status 04/26/2015  . H/O: HTN (hypertension) 04/26/2015  . Obesity (BMI 30.0-34.9) 04/26/2015  . Psoriasis 04/26/2015  . Allergic rhinitis, seasonal 04/26/2015  . Vitamin D deficiency 04/26/2015  . Bilateral knee pain 04/26/2015    Past Surgical History:  Procedure Laterality Date  . BARIATRIC SURGERY    . CHOLECYSTECTOMY    . DILATION AND CURETTAGE OF UTERUS      Family History  Problem Relation Age of Onset  . Diabetes Mother   . Diabetes Father   . COPD Father   . Seizures Father   . Heart disease Father 34       CABG  . Diabetes Brother   . Multiple sclerosis Brother   . Stroke Paternal Grandmother     Social History   Socioeconomic History  . Marital status: Married    Spouse name: Not on file  . Number of children: Not on file  . Years of education: Not on file  . Highest education level: Not on file  Social Needs  . Financial resource strain: Not on file  . Food insecurity - worry: Not on file  . Food insecurity - inability: Not on file  . Transportation needs - medical: Not on file  . Transportation needs - non-medical: Not on file  Occupational History  . Not on  file  Tobacco Use  . Smoking status: Never Smoker  . Smokeless tobacco: Never Used  Substance and Sexual Activity  . Alcohol use: No    Alcohol/week: 0.0 oz  . Drug use: No  . Sexual activity: Yes    Birth control/protection: None  Other Topics Concern  . Not on file  Social History Narrative  . Not on file     Current Outpatient Medications:  .  acetaminophen (TYLENOL) 500 MG tablet, Take 1,000 mg by mouth as needed for moderate pain., Disp: , Rfl:  .  Apremilast (OTEZLA) 30 MG TABS, Take 1 tablet by mouth 2 (two) times daily. For psoriasis, Disp: , Rfl:  .  cyclobenzaprine (FLEXERIL) 10 MG tablet, TAKE 1 TABLET BY  MOUTH 3 TIMES DAILY AS NEEDED FOR MUSCLE SPASMS, Disp: 90 tablet, Rfl: 0 .  DULoxetine (CYMBALTA) 60 MG capsule, TAKE 1 CAPSULE BY MOUTH  DAILY, Disp: 90 capsule, Rfl: 1 .  gabapentin (NEURONTIN) 300 MG capsule, Take 1 capsule (300 mg total) by mouth at bedtime., Disp: 90 capsule, Rfl: 1 .  omeprazole (PRILOSEC) 40 MG capsule, Take 1 capsule (40 mg total) by mouth daily. (Patient taking differently: Take 40 mg by mouth daily as needed. ), Disp: 30 capsule, Rfl: 0  Allergies  Allergen Reactions  . Other Other (See Comments)    Any fragrant smell sets her off  . Codeine   . Latex   . Bacitracin-Polymyxin B Rash    redness if left on for long periods   states no to latex   use paper tape please     ROS  Constitutional: Negative for fever or weight change.  Respiratory: Negative for cough and shortness of breath.   Cardiovascular: Negative for chest pain or palpitations.  Gastrointestinal: Negative for abdominal pain, no bowel changes.  Musculoskeletal: Negative for gait problem or joint swelling.  Skin: Negative for rash.  Neurological: Negative for dizziness , positive for mild headache since yesterday.  No other specific complaints in a complete review of systems (except as listed in HPI above).  Objective  Vitals:   06/30/17 0859  BP: 120/72  Pulse: 83  Resp: 12  SpO2: 98%  Weight: 184 lb (83.5 kg)  Height: 5\' 1"  (1.549 m)    Body mass index is 34.77 kg/m.  Physical Exam  Constitutional: Patient appears well-developed and well-nourished. Obese  No distress.  HEENT: head atraumatic, normocephalic, pupils equal and reactive to light,  neck supple, throat within normal limits Cardiovascular: Normal rate, regular rhythm and normal heart sounds.  No murmur heard. No BLE edema. Pulmonary/Chest: Effort normal and breath sounds normal. No respiratory distress. Abdominal: Soft.  There is no tenderness. Psychiatric: Patient has a normal mood and affect. behavior is normal.  Judgment and thought content normal. Muscular Skeletal:  pain during palpation of lumbar spine, negative straight leg raise  PHQ2/9: Depression screen [facility] 2/9 06/30/2017 03/26/2017 12/24/2016 07/15/2016 06/09/2016  Decreased Interest 0 1 0 0 0  Down, Depressed, Hopeless 0 0 0 0 0  PHQ - 2 Score 0 1 0 0 0  Altered sleeping 1 - - - -  Tired, decreased energy 1 - - - -  Change in appetite 0 - - - -  Feeling bad or failure about yourself  1 - - - -  Trouble concentrating 0 - - - -  Moving slowly or fidgety/restless 0 - - - -  Suicidal thoughts 0 - - - -  PHQ-9 Score 3 - - - -  Difficult doing work/chores Not difficult at all - - - -     Fall Risk: Fall Risk  06/30/2017 03/26/2017 12/24/2016 07/15/2016 06/09/2016  Falls in the past year? No Yes Yes No No  Comment - - - - -  Number falls in past yr: - 1 1 - -  Injury with Fall? - Yes Yes - -  Comment - - Little Toe on Right Foot - -      Functional Status Survey: Is the patient deaf or have difficulty hearing?: No Does the patient have difficulty seeing, even when wearing glasses/contacts?: No Does the patient have difficulty concentrating, remembering, or making decisions?: No Does the patient have difficulty walking or climbing stairs?: No Does the patient have difficulty dressing or bathing?: No Does the patient have difficulty doing errands alone such as visiting a doctor's office or shopping?: No    Assessment & Plan  1. Dysmetabolic syndrome  Last YIAX6P 5.7%, on life style modification, she wants to try medication, discussed options we will start Metformin first, also discussed GLP-1 agonist, she denies pancreatitis or family history of thyroid cancer  We will start metformin 500 mg daily for now , discussed possible side effects  2. B12 deficiency  - cyanocobalamin ((VITAMIN B-12)) injection 1,000 mcg  3. Flu vaccine need  - Flu Vaccine QUAD 36+ mos IM  4. Left lumbar radiculitis  Improved, taking medication daily    5. Depression, major, recurrent, mild ([facility])  Stable   6. Bariatric surgery status  She is frustrated about her weight.   7. Dyslipidemia  LDL has gone up since last visit  8. History of iron deficiency anemia  resolved  9. Vitamin D deficiency  Continue supplementation   11. Tachycardia  Seen by Dr. Saunders Revel, she has decreased caffeine intake, had echo and is doing better now  12. De Quervain's tenosynovitis, right  - Elastic Bandages & Supports (WRIST BRACE/RIGHT MEDIUM) MISC; 1 each daily by Does not apply route.  Dispense: 1 each; Refill: 0 Recent flare

## 2017-07-28 ENCOUNTER — Other Ambulatory Visit: Payer: Self-pay

## 2017-07-28 DIAGNOSIS — M5416 Radiculopathy, lumbar region: Secondary | ICD-10-CM

## 2017-07-28 MED ORDER — GABAPENTIN 300 MG PO CAPS: 300.0000 mg | ORAL_CAPSULE | Freq: Every day | ORAL | 1 refills | Status: DC

## 2017-07-28 NOTE — Telephone Encounter (Signed)
Refill request for general medication: Gabapentin 300 mg   Last office visit: 06/30/2017  Last physical exam: None indicated  Follow up visit: 09/29/2017

## 2017-08-18 ENCOUNTER — Other Ambulatory Visit: Payer: Self-pay | Admitting: Family Medicine

## 2017-08-18 DIAGNOSIS — Z9884 Bariatric surgery status: Secondary | ICD-10-CM

## 2017-08-18 DIAGNOSIS — E8881 Metabolic syndrome: Secondary | ICD-10-CM

## 2017-08-19 ENCOUNTER — Ambulatory Visit: Payer: 59 | Admitting: Internal Medicine

## 2017-08-21 ENCOUNTER — Other Ambulatory Visit: Payer: Self-pay | Admitting: Family Medicine

## 2017-08-21 DIAGNOSIS — M5416 Radiculopathy, lumbar region: Secondary | ICD-10-CM

## 2017-08-21 MED ORDER — CYCLOBENZAPRINE HCL 10 MG PO TABS: ORAL_TABLET | ORAL | 0 refills | Status: DC

## 2017-08-21 NOTE — Telephone Encounter (Signed)
Refill request for general medication: Cyclobenzaprine to [facility].   Last office visit: 06/30/2017  Follow up 09/29/2017

## 2017-09-10 NOTE — Progress Notes (Signed)
Follow-up Outpatient Visit Date: 09/16/2017  Primary Care Provider: Steele Sizer, [address] [place] [address]  Chief Complaint: Sinus congestion  HPI:  Ashlee Cruz is a 52 y.o. year-old female with history of hypertension, borderline hyperlipidemia, impaired glucose tolerance, psoriasis, depression, and obesity status post bariatric surgery, who presents for follow-up of elevated heart rate. I met Ashlee Cruz in 05/2017, at which time she reported that her heart rate seemed higher than usual in the setting of episodes of "anxiety." Echocardiogram was normal except for mild diastolic function.  Today, Ashlee Cruz reports that she has been doing well with the exception of a cold that began 4 days ago.  She has noted sinus congestion for which she has used over-the-counter cold medications.  Up until the symptoms began, Ashlee Cruz she had been doing well with only a few sporadic palpitations.  She denies chest pain, shortness of breath, lightheadedness, orthopnea, and edema.  She has been able to cut her caffeine consumption in half but still drinks some during the day.  She has noted 1 or 2 episodes of brief rapid heart rate after eating something that she is not supposed to following remote gastric bypass.  --------------------------------------------------------------------------------------------------  Cardiovascular History & Procedures: Cardiovascular Problems:  Palpitations with sinus tachycardia  Risk Factors:  Hypertension, hyperlipidemia, and obesity  Cath/PCI:  None  CV Surgery:  None  EP Procedures and Devices:  None  Non-Invasive Evaluation(s):  TTE (05/27/17): Technically difficult study. Normal LV size and wall thickness. LVEF 55-60% with grade 1 diastolic dysfunction. Normal RV size and function.  Recent CV Pertinent Labs: Lab Results  Component Value Date   CHOL 242 (H) 03/27/2017   HDL 94 03/27/2017   LDLCALC 136 (H) 03/27/2017     TRIG 60 03/27/2017   CHOLHDL 2.6 03/27/2017   K 4.4 03/27/2017   K 3.9 10/29/2011   MG 1.7 (L) 10/29/2011   BUN 10 03/27/2017   BUN 6 (L) 10/29/2011   CREATININE 0.73 03/27/2017   CREATININE 0.55 (L) 10/29/2011    Past medical and surgical history were reviewed and updated in EPIC.  Current Meds  Medication Sig  . acetaminophen (TYLENOL) 500 MG tablet Take 1,000 mg by mouth as needed for moderate pain.  Marland Kitchen Apremilast (OTEZLA) 30 MG TABS Take 1 tablet by mouth 2 (two) times daily. For psoriasis  . cyclobenzaprine (FLEXERIL) 10 MG tablet TAKE 1 TABLET BY MOUTH 3 TIMES DAILY AS NEEDED FOR MUSCLE SPASMS  . DULoxetine (CYMBALTA) 60 MG capsule TAKE 1 CAPSULE BY MOUTH  DAILY  . Elastic Bandages & Supports (WRIST BRACE/RIGHT MEDIUM) MISC 1 each daily by Does not apply route.  . gabapentin (NEURONTIN) 300 MG capsule Take 1 capsule (300 mg total) by mouth at bedtime.  . metFORMIN (GLUCOPHAGE-XR) 500 MG 24 hr tablet TAKE 1 TABLET DAILY WITH  BREAKFAST BY MOUTH.  Marland Kitchen omeprazole (PRILOSEC) 40 MG capsule Take 1 capsule (40 mg total) by mouth daily. (Patient taking differently: Take 40 mg by mouth daily as needed. )   Current Facility-Administered Medications for the 09/16/17 encounter (Office Visit) with Jillene Wehrenberg, Cruz Gave, MD  Medication  . cyanocobalamin ((VITAMIN B-12)) injection 1,000 mcg    Allergies: Other; Codeine; Latex; and Bacitracin-polymyxin b  Social History   Socioeconomic History  . Marital status: Married    Spouse name: Not on file  . Number of children: Not on file  . Years of education: Not on file  . Highest education level: Not on file  Social Needs  . Financial resource strain: Not on file  . Food insecurity - worry: Not on file  . Food insecurity - inability: Not on file  . Transportation needs - medical: Not on file  . Transportation needs - non-medical: Not on file  Occupational History  . Not on file  Tobacco Use  . Smoking status: Never Smoker  . Smokeless  tobacco: Never Used  Substance and Sexual Activity  . Alcohol use: No    Alcohol/week: 0.0 oz  . Drug use: No  . Sexual activity: Yes    Birth control/protection: None  Other Topics Concern  . Not on file  Social History Narrative  . Not on file    Family History  Problem Relation Age of Onset  . Diabetes Mother   . Diabetes Father   . COPD Father   . Seizures Father   . Heart disease Father 88       CABG  . Diabetes Brother   . Multiple sclerosis Brother   . Stroke Paternal Grandmother     Review of Systems: A 12-system review of systems was performed and was negative except as noted in the HPI.  --------------------------------------------------------------------------------------------------  Physical Exam: BP 100/80 (BP Location: Left Arm, Patient Position: Sitting, Cuff Size: Normal)   Pulse (!) 102   Ht _0  (1.626 m)   Wt 177 lb 12 oz (80.6 kg)   LMP 10/27/2015   BMI 30.51 kg/m   General: Obese woman, seated comfortably in the exam room. HEENT: No conjunctival pallor or scleral icterus. Moist mucous membranes.  OP clear. Neck: Supple without lymphadenopathy, thyromegaly, JVD, or HJR. Lungs: Normal work of breathing.  Coarse breath sounds without wheezes or crackles.  Good air movement. Heart: Tachycardic but regular without murmurs, rubs, or gallops. Non-displaced PMI. Abd: Bowel sounds present. Soft, NT/ND without hepatosplenomegaly Ext: No lower extremity edema. Radial, PT, and DP pulses are 2+ bilaterally. Skin: Warm and dry without rash.  EKG: Sinus tachycardia (heart rate 102 bpm) with low voltage.  Otherwise, no significant abnormalities.  Lab Results  Component Value Date   WBC 9.2 03/27/2017   HGB 13.8 03/27/2017   HCT 42.0 03/27/2017   MCV 87 03/27/2017   PLT 259 03/27/2017    Lab Results  Component Value Date   NA 143 03/27/2017   K 4.4 03/27/2017   CL 103 03/27/2017   CO2 25 08/20/2016   BUN 10 03/27/2017   CREATININE 0.73  03/27/2017   GLUCOSE 92 03/27/2017   ALT 26 08/20/2016    Lab Results  Component Value Date   CHOL 242 (H) 03/27/2017   HDL 94 03/27/2017   LDLCALC 136 (H) 03/27/2017   TRIG 60 03/27/2017   CHOLHDL 2.6 03/27/2017    --------------------------------------------------------------------------------------------------  ASSESSMENT AND PLAN: Sinus tachycardia Heart rate mildly elevated today, similar to prior visit with [place].  Heart rate at her last visit with Dr. Ancil Boozer in November was normal at 28.  I suspect that ongoing upper respiratory tract infection and recent over-the-counter cold medication use may be exacerbating her heart rate.  Echocardiogram after our last visit in October was unrevealing.  Given that Ms. Hebel has minimal palpitations, we have agreed to defer further workup.  If symptoms worsen, she should contact us to discuss further evaluation to include event monitor placement.  Upper respiratory tract infection Ms. Witherow reports 3-4 days of sinus congestion suggestive of URI.  I have encouraged conservative measures, including over-the-counter cold medications that do not  contain stimulants such as phenylephrine and pseudoephedrine.  She should also stay well-hydrated.  If symptoms persist, she should contact Dr. Ancil Boozer for further evaluation.  Follow-up: Return to clinic as needed.  Nelva Bush, MD 09/16/2017 8:39 AM

## 2017-09-16 ENCOUNTER — Ambulatory Visit: Payer: Managed Care, Other (non HMO) | Admitting: Internal Medicine

## 2017-09-16 ENCOUNTER — Encounter: Payer: Self-pay | Admitting: Internal Medicine

## 2017-09-16 VITALS — BP 100/80 | HR 102 | Ht 64.0 in | Wt 177.8 lb

## 2017-09-16 DIAGNOSIS — J069 Acute upper respiratory infection, unspecified: Secondary | ICD-10-CM | POA: Diagnosis not present

## 2017-09-16 DIAGNOSIS — R000 Tachycardia, unspecified: Secondary | ICD-10-CM | POA: Diagnosis not present

## 2017-09-16 NOTE — Patient Instructions (Signed)
Medication Instructions:  Your physician recommends that you continue on your current medications as directed. Please refer to the Current Medication list given to you today.   Labwork: none  Testing/Procedures: none  Follow-Up: Your physician recommends that you schedule a follow-up appointment in: as needed.    

## 2017-09-22 ENCOUNTER — Other Ambulatory Visit: Payer: Self-pay | Admitting: Family Medicine

## 2017-09-22 ENCOUNTER — Ambulatory Visit: Payer: 59 | Admitting: Family Medicine

## 2017-09-22 ENCOUNTER — Encounter: Payer: Self-pay | Admitting: Family Medicine

## 2017-09-22 VITALS — BP 124/68 | HR 96 | Temp 98.2°F | Resp 16 | Ht 64.0 in | Wt 176.0 lb

## 2017-09-22 DIAGNOSIS — M5416 Radiculopathy, lumbar region: Secondary | ICD-10-CM

## 2017-09-22 DIAGNOSIS — F330 Major depressive disorder, recurrent, mild: Secondary | ICD-10-CM

## 2017-09-22 DIAGNOSIS — J0100 Acute maxillary sinusitis, unspecified: Secondary | ICD-10-CM

## 2017-09-22 MED ORDER — FLUTICASONE PROPIONATE 50 MCG/ACT NA SUSP
2.0000 | Freq: Every day | NASAL | 0 refills | Status: DC
Start: 2017-09-22 — End: 2019-01-11

## 2017-09-22 MED ORDER — AMOXICILLIN-POT CLAVULANATE 875-125 MG PO TABS: 1.0000 | ORAL_TABLET | Freq: Two times a day (BID) | ORAL | 0 refills | Status: DC

## 2017-09-22 NOTE — Patient Instructions (Addendum)
Take a probiotic or eat probiotic yogurt while taking Augmentin.   Sinusitis, Adult Sinusitis is soreness and inflammation of your sinuses. Sinuses are hollow spaces in the bones around your face. They are located:  Around your eyes.  In the middle of your forehead.  Behind your nose.  In your cheekbones.  Your sinuses and nasal passages are lined with a stringy fluid (mucus). Mucus normally drains out of your sinuses. When your nasal tissues get inflamed or swollen, the mucus can get trapped or blocked so air cannot flow through your sinuses. This lets bacteria, viruses, and funguses grow, and that leads to infection. Follow these instructions at home: Medicines  Take, use, or apply over-the-counter and prescription medicines only as told by your doctor. These may include nasal sprays.  If you were prescribed an antibiotic medicine, take it as told by your doctor. Do not stop taking the antibiotic even if you start to feel better. Hydrate and Humidify  Drink enough water to keep your pee (urine) clear or pale yellow.  Use a cool mist humidifier to keep the humidity level in your home above 50%.  Breathe in steam for 10-15 minutes, 3-4 times a day or as told by your doctor. You can do this in the bathroom while a hot shower is running.  Try not to spend time in cool or dry air. Rest  Rest as much as possible.  Sleep with your head raised (elevated).  Make sure to get enough sleep each night. General instructions  Put a warm, moist washcloth on your face 3-4 times a day or as told by your doctor. This will help with discomfort.  Wash your hands often with soap and water. If there is no soap and water, use hand sanitizer.  Do not smoke. Avoid being around people who are smoking (secondhand smoke).  Keep all follow-up visits as told by your doctor. This is important. Contact a doctor if:  You have a fever.  Your symptoms get worse.  Your symptoms do not get better  within 10 days. Get help right away if:  You have a very bad headache.  You cannot stop throwing up (vomiting).  You have pain or swelling around your face or eyes.  You have trouble seeing.  You feel confused.  Your neck is stiff.  You have trouble breathing. This information is not intended to replace advice given to you by your health care provider. Make sure you discuss any questions you have with your health care provider. Document Released: 01/21/2008 Document Revised: 03/30/2016 Document Reviewed: 05/30/2015 Elsevier Interactive Patient Education  [employer].

## 2017-09-22 NOTE — Progress Notes (Signed)
Name: Ashlee Cruz   MRN: [id number]    DOB: 1966/05/10   Date:09/22/2017       Progress Note  Subjective  Chief Complaint  Chief Complaint  Patient presents with  . Sinusitis    x 1 week    HPI  Pt presents with 9 day history of sinus congestion - symptoms started 9 days ago, improved for a few days, then 3 days ago she had a sudden worsening with nasal congestion, upper dental pain, left ear pressure, and some left eye irritation/itching.  Endorses some sore throat, non-productive cough.  Denies chest pain or shortness of breath, fevers/chills, body aches, rashes, or recent travel.   Pt takes metformin for dysmetabolic syndrome - is not diabetic.  Has been trying generic cold medication without relief.  Patient Active Problem List   Diagnosis Date Noted  . Tachycardia 05/19/2017  . Palpitations 05/19/2017  . Fibroid uterus 12/20/2015  . Class 1 obesity 12/13/2015  . History of iron deficiency anemia 10/23/2015  . Left lumbar radiculitis 10/23/2015  . Absence of menstruation 04/26/2015  . B12 deficiency 04/26/2015  . Depression, major, recurrent, mild ([place]) 04/26/2015  . Dyslipidemia 04/26/2015  . Dysmetabolic syndrome 02/40/9735  . Bariatric surgery status 04/26/2015  . H/O: HTN (hypertension) 04/26/2015  . Obesity (BMI 30.0-34.9) 04/26/2015  . Psoriasis 04/26/2015  . Allergic rhinitis, seasonal 04/26/2015  . Vitamin D deficiency 04/26/2015  . Bilateral knee pain 04/26/2015    Social History   Tobacco Use  . Smoking status: Never Smoker  . Smokeless tobacco: Never Used  Substance Use Topics  . Alcohol use: No    Alcohol/week: 0.0 oz     Current Outpatient Medications:  .  acetaminophen (TYLENOL) 500 MG tablet, Take 1,000 mg by mouth as needed for moderate pain., Disp: , Rfl:  .  Apremilast (OTEZLA) 30 MG TABS, Take 1 tablet by mouth 2 (two) times daily. For psoriasis, Disp: , Rfl:  .  cyclobenzaprine (FLEXERIL) 10 MG tablet, TAKE 1 TABLET BY MOUTH 3 TIMES DAILY AS  NEEDED FOR MUSCLE SPASMS, Disp: 90 tablet, Rfl: 0 .  DULoxetine (CYMBALTA) 60 MG capsule, TAKE 1 CAPSULE BY MOUTH  DAILY, Disp: 90 capsule, Rfl: 1 .  Elastic Bandages & Supports (WRIST BRACE/RIGHT MEDIUM) MISC, 1 each daily by Does not apply route., Disp: 1 each, Rfl: 0 .  gabapentin (NEURONTIN) 300 MG capsule, Take 1 capsule (300 mg total) by mouth at bedtime., Disp: 90 capsule, Rfl: 1 .  metFORMIN (GLUCOPHAGE-XR) 500 MG 24 hr tablet, TAKE 1 TABLET DAILY WITH  BREAKFAST BY MOUTH., Disp: 90 tablet, Rfl: 0 .  omeprazole (PRILOSEC) 40 MG capsule, Take 1 capsule (40 mg total) by mouth daily. (Patient taking differently: Take 40 mg by mouth daily as needed. ), Disp: 30 capsule, Rfl: 0  Current Facility-Administered Medications:  .  cyanocobalamin ((VITAMIN B-12)) injection 1,000 mcg, 1,000 mcg, Subcutaneous, Q30 days, Steele Sizer, MD, 1,000 mcg at 06/30/17 1000  Allergies  Allergen Reactions  . Other Other (See Comments)    Any fragrant smell sets her off  . Codeine   . Latex   . Bacitracin-Polymyxin B Rash    redness if left on for long periods   states no to latex   use paper tape please    ROS  Ten systems reviewed and is negative except as mentioned in HPI  Objective  Vitals:   09/22/17 1048  BP: 124/68  Pulse: 96  Resp: 16  Temp: 98.2 F (36.8 C)  TempSrc: Oral  SpO2: 95%  Weight: 176 lb (79.8 kg)  Height: 5\' 4"  (1.626 m)   Body mass index is 30.21 kg/m.  Nursing Note and Vital Signs reviewed.  Physical Exam  Constitutional: Patient appears well-developed and well-nourished. Obese.  No distress.  HEENT: head atraumatic, normocephalic, pupils equal and reactive to light, EOM's intact, TM's without erythema or bulging, positive for maxillary tenderness; no frontal sinus tenderness, neck supple with mild bilateral submandibular lymphadenopathy, oropharynx erythematous and moist without exudate Cardiovascular: Normal rate, regular rhythm, S1/S2 present.  No murmur or  rub heard. No BLE edema. Pulmonary/Chest: Effort normal and breath sounds clear. No respiratory distress or retractions. Psychiatric: Patient has a normal mood and affect. behavior is normal. Judgment and thought content normal.  No results found for this or any previous visit (from the past 72 hour(s)).  Assessment & Plan  1. Acute non-recurrent maxillary sinusitis - amoxicillin-clavulanate (AUGMENTIN) 875-125 MG tablet; Take 1 tablet by mouth 2 (two) times daily.  Dispense: 20 tablet; Refill: 0 - fluticasone (FLONASE) 50 MCG/ACT nasal spray; Place 2 sprays into both nostrils daily.  Dispense: 16 g; Refill: 0  -Red flags and when to present for emergency care or RTC including fever >101.68F, chest pain, shortness of breath, new/worsening/un-resolving symptoms, pain with ocular movement, or periorbital swelling, reviewed with patient at time of visit. Follow up and care instructions discussed and provided in AVS.

## 2017-09-22 NOTE — Telephone Encounter (Signed)
Patient requesting refill of Duloxetine to [facility].

## 2017-09-29 ENCOUNTER — Ambulatory Visit: Payer: 59 | Admitting: Family Medicine

## 2017-10-13 ENCOUNTER — Encounter: Payer: Self-pay | Admitting: Family Medicine

## 2017-10-13 DIAGNOSIS — M5416 Radiculopathy, lumbar region: Secondary | ICD-10-CM

## 2017-10-13 DIAGNOSIS — F330 Major depressive disorder, recurrent, mild: Secondary | ICD-10-CM

## 2017-10-13 MED ORDER — DULOXETINE HCL 60 MG PO CPEP: 60.0000 mg | ORAL_CAPSULE | Freq: Every day | ORAL | 1 refills | Status: DC

## 2017-10-13 NOTE — Telephone Encounter (Signed)
Refill request for general medication: Cymbalta 60   Last office visit: 09/22/2017  Last physical exam: None indicated  Follow-up on file. 11/09/2017

## 2017-11-09 ENCOUNTER — Encounter: Payer: Self-pay | Admitting: Family Medicine

## 2017-11-09 ENCOUNTER — Ambulatory Visit: Payer: Managed Care, Other (non HMO) | Admitting: Family Medicine

## 2017-11-09 VITALS — BP 106/62 | HR 132 | Temp 97.9°F | Resp 18 | Ht 64.0 in | Wt 178.6 lb

## 2017-11-09 DIAGNOSIS — I5189 Other ill-defined heart diseases: Secondary | ICD-10-CM

## 2017-11-09 DIAGNOSIS — R000 Tachycardia, unspecified: Secondary | ICD-10-CM

## 2017-11-09 DIAGNOSIS — M5416 Radiculopathy, lumbar region: Secondary | ICD-10-CM | POA: Diagnosis not present

## 2017-11-09 DIAGNOSIS — E8881 Metabolic syndrome: Secondary | ICD-10-CM

## 2017-11-09 DIAGNOSIS — F330 Major depressive disorder, recurrent, mild: Secondary | ICD-10-CM | POA: Diagnosis not present

## 2017-11-09 DIAGNOSIS — I519 Heart disease, unspecified: Secondary | ICD-10-CM

## 2017-11-09 DIAGNOSIS — E785 Hyperlipidemia, unspecified: Secondary | ICD-10-CM | POA: Diagnosis not present

## 2017-11-09 DIAGNOSIS — E559 Vitamin D deficiency, unspecified: Secondary | ICD-10-CM | POA: Diagnosis not present

## 2017-11-09 DIAGNOSIS — Z9884 Bariatric surgery status: Secondary | ICD-10-CM | POA: Diagnosis not present

## 2017-11-09 DIAGNOSIS — E538 Deficiency of other specified B group vitamins: Secondary | ICD-10-CM | POA: Diagnosis not present

## 2017-11-09 MED ORDER — METFORMIN HCL ER 500 MG PO TB24: ORAL_TABLET | ORAL | 1 refills | Status: DC

## 2017-11-09 MED ORDER — GABAPENTIN 300 MG PO CAPS: 300.0000 mg | ORAL_CAPSULE | Freq: Every day | ORAL | 1 refills | Status: DC

## 2017-11-09 NOTE — Progress Notes (Signed)
Name: Ashlee Cruz   MRN: [id number]    DOB: 06-Apr-1966   Date:11/09/2017       Progress Note  Subjective  Chief Complaint  Chief Complaint  Patient presents with  . Medication Refill    3 month F/U  . Hypertension  . Depression  . Back Pain  . Tachycardia    HPI  Left lower back pain: she had back surgery done by Dr. Consuella Lose in July 2017 and was doing great, but a few months after the surgery she had a flare. She was seen by Dr. Kathyrn Sheriff and had steroid injection end of November 2017 and symptoms improved a little, she had another injection January 2018 and pain average  Is 3/10 now, had a flare back 03/2017 but did not need an injection at that time, taking Flexeril and still has medication at home, she states cymbalta helps with pain level   Major Depression Mild Recurrent:she is now on Cymbalta to help with pain and mood, still worries about turning 52 yo and her life expectancy. She is able to focus, motivation is good.   Bariatric Surgery: she stopped taking all supplementsagain, explained importance of compliance with supplements, she has a history of B12, D and iron deficiency anemia.Sheis getting B12 injections monthly. She is back on Metformin now, but has been skipping doses to stretch it out.   Metabolic Syndrome: last [id number] been going up, weight also trending upShe denies polyphagia, polydipsia or polyuria. She has been going up again.   History of iron deficiency : not taking supplements at this time.we will recheck labs next visit   Tachycardia: she used to take beta-blocker, but bp dropped so she has been off for years. She was seen by Dr. Saunders Revel and was advised to decrease caffeine intake and exercise more, echo was normal. She states heart rate at home is not as high.   Patient Active Problem List   Diagnosis Date Noted  . Tachycardia 05/19/2017  . Palpitations 05/19/2017  . Fibroid uterus 12/20/2015  . Class 1 obesity 12/13/2015  .  History of iron deficiency anemia 10/23/2015  . Left lumbar radiculitis 10/23/2015  . Absence of menstruation 04/26/2015  . B12 deficiency 04/26/2015  . Depression, major, recurrent, mild ([place]) 04/26/2015  . Dyslipidemia 04/26/2015  . Dysmetabolic syndrome 63/84/6659  . Bariatric surgery status 04/26/2015  . H/O: HTN (hypertension) 04/26/2015  . Obesity (BMI 30.0-34.9) 04/26/2015  . Psoriasis 04/26/2015  . Allergic rhinitis, seasonal 04/26/2015  . Vitamin D deficiency 04/26/2015  . Bilateral knee pain 04/26/2015    Past Surgical History:  Procedure Laterality Date  . BARIATRIC SURGERY    . CHOLECYSTECTOMY    . DILATION AND CURETTAGE OF UTERUS      Family History  Problem Relation Age of Onset  . Diabetes Mother   . Diabetes Father   . COPD Father   . Seizures Father   . Heart disease Father 39       CABG  . Diabetes Brother   . Multiple sclerosis Brother   . Stroke Paternal Grandmother     Social History   Socioeconomic History  . Marital status: Married    Spouse name: Not on file  . Number of children: Not on file  . Years of education: Not on file  . Highest education level: Not on file  Occupational History  . Not on file  Social Needs  . Financial resource strain: Not on file  . Food insecurity:  Worry: Not on file    Inability: Not on file  . Transportation needs:    Medical: Not on file    Non-medical: Not on file  Tobacco Use  . Smoking status: Never Smoker  . Smokeless tobacco: Never Used  Substance and Sexual Activity  . Alcohol use: No    Alcohol/week: 0.0 oz  . Drug use: No  . Sexual activity: Yes    Birth control/protection: None  Lifestyle  . Physical activity:    Days per week: Not on file    Minutes per session: Not on file  . Stress: Not on file  Relationships  . Social connections:    Talks on phone: Not on file    Gets together: Not on file    Attends religious service: Not on file    Active member of club or organization:  Not on file    Attends meetings of clubs or organizations: Not on file    Relationship status: Not on file  . Intimate partner violence:    Fear of current or ex partner: Not on file    Emotionally abused: Not on file    Physically abused: Not on file    Forced sexual activity: Not on file  Other Topics Concern  . Not on file  Social History Narrative  . Not on file     Current Outpatient Medications:  .  acetaminophen (TYLENOL) 500 MG tablet, Take 1,000 mg by mouth as needed for moderate pain., Disp: , Rfl:  .  Apremilast (OTEZLA) 30 MG TABS, Take 1 tablet by mouth 2 (two) times daily. For psoriasis, Disp: , Rfl:  .  cyclobenzaprine (FLEXERIL) 10 MG tablet, TAKE 1 TABLET BY MOUTH 3 TIMES DAILY AS NEEDED FOR MUSCLE SPASMS, Disp: 90 tablet, Rfl: 0 .  DULoxetine (CYMBALTA) 60 MG capsule, Take 1 capsule (60 mg total) by mouth daily., Disp: 90 capsule, Rfl: 1 .  fluticasone (FLONASE) 50 MCG/ACT nasal spray, Place 2 sprays into both nostrils daily., Disp: 16 g, Rfl: 0 .  gabapentin (NEURONTIN) 300 MG capsule, Take 1 capsule (300 mg total) by mouth at bedtime., Disp: 90 capsule, Rfl: 1 .  metFORMIN (GLUCOPHAGE-XR) 500 MG 24 hr tablet, TAKE 1 TABLET DAILY WITH  BREAKFAST BY MOUTH., Disp: 90 tablet, Rfl: 1 .  omeprazole (PRILOSEC) 40 MG capsule, Take 1 capsule (40 mg total) by mouth daily. (Patient taking differently: Take 40 mg by mouth daily as needed. ), Disp: 30 capsule, Rfl: 0  Current Facility-Administered Medications:  .  cyanocobalamin ((VITAMIN B-12)) injection 1,000 mcg, 1,000 mcg, Subcutaneous, Q30 days, Steele Sizer, MD, 1,000 mcg at 06/30/17 1000  Allergies  Allergen Reactions  . Other Other (See Comments)    Any fragrant smell sets her off  . Codeine   . Latex   . Bacitracin-Polymyxin B Rash    redness if left on for long periods   states no to latex   use paper tape please     ROS  Constitutional: Negative for fever or weight change.  Respiratory: Negative for  cough and shortness of breath.   Cardiovascular: Negative for chest pain or palpitations.  Gastrointestinal: Negative for abdominal pain, no bowel changes.  Musculoskeletal: Negative for gait problem or joint swelling.  Skin: Positive  for rash.  Neurological: Negative for dizziness or headache.  No other specific complaints in a complete review of systems (except as listed in HPI above).  Objective  Vitals:   11/09/17 0849  BP: 106/62  Pulse: Marland Kitchen)  132  Resp: 18  Temp: 97.9 F (36.6 C)  TempSrc: Oral  SpO2: 96%  Weight: 178 lb 9.6 oz (81 kg)  Height: 5\' 4"  (1.626 m)    Body mass index is 30.66 kg/m.  Physical Exam  Constitutional: Patient appears well-developed and well-nourished. Obese  No distress.  HEENT: head atraumatic, normocephalic, pupils equal and reactive to light,neck supple, throat within normal limits Cardiovascular: Normal rate, regular rhythm and normal heart sounds.  No murmur heard. No BLE edema. Pulmonary/Chest: Effort normal and breath sounds normal. No respiratory distress. Abdominal: Soft.  There is no tenderness. Skin: mild dryness on thumb web Psychiatric: Patient has a normal mood and affect. behavior is normal. Judgment and thought content normal.  PHQ2/9: Depression screen [facility] 2/9 11/09/2017 06/30/2017 03/26/2017 12/24/2016 07/15/2016  Decreased Interest 0 0 1 0 0  Down, Depressed, Hopeless 0 0 0 0 0  PHQ - 2 Score 0 0 1 0 0  Altered sleeping 1 1 - - -  Tired, decreased energy 1 1 - - -  Change in appetite 1 0 - - -  Feeling bad or failure about yourself  0 1 - - -  Trouble concentrating 0 0 - - -  Moving slowly or fidgety/restless 0 0 - - -  Suicidal thoughts 0 0 - - -  PHQ-9 Score 3 3 - - -  Difficult doing work/chores Not difficult at all Not difficult at all - - -     Fall Risk: Fall Risk  11/09/2017 06/30/2017 03/26/2017 12/24/2016 07/15/2016  Falls in the past year? No No Yes Yes No  Comment - - - - -  Number falls in past yr: - - 1 1 -   Injury with Fall? - - Yes Yes -  Comment - - - Little Toe on Right Foot -    Functional Status Survey: Is the patient deaf or have difficulty hearing?: No Does the patient have difficulty seeing, even when wearing glasses/contacts?: No Does the patient have difficulty concentrating, remembering, or making decisions?: No Does the patient have difficulty walking or climbing stairs?: No Does the patient have difficulty dressing or bathing?: No Does the patient have difficulty doing errands alone such as visiting a doctor's office or shopping?: No   Assessment & Plan  1. Dysmetabolic syndrome  - metFORMIN (GLUCOPHAGE-XR) 500 MG 24 hr tablet; TAKE 1 TABLET DAILY WITH  BREAKFAST BY MOUTH.  Dispense: 90 tablet; Refill: 1  2. Bariatric surgery status  - metFORMIN (GLUCOPHAGE-XR) 500 MG 24 hr tablet; TAKE 1 TABLET DAILY WITH  BREAKFAST BY MOUTH.  Dispense: 90 tablet; Refill: 1  3. Left lumbar radiculitis  - gabapentin (NEURONTIN) 300 MG capsule; Take 1 capsule (300 mg total) by mouth at bedtime.  Dispense: 90 capsule; Refill: 1  4. Depression, major, recurrent, mild ([place])  Doing well on Duloxetine   5. B12 deficiency  Continue supplementation   6. Vitamin D deficiency  Continue supplementation   7. Dyslipidemia  Continue life style modification   8. Tachycardia  Seen by Dr. Saunders Revel and given reassurance   9. Diastolic dysfunction  Based on Echo done 05/2017

## 2017-12-03 ENCOUNTER — Other Ambulatory Visit: Payer: Self-pay | Admitting: Family Medicine

## 2017-12-03 DIAGNOSIS — M5416 Radiculopathy, lumbar region: Secondary | ICD-10-CM

## 2018-02-19 ENCOUNTER — Other Ambulatory Visit: Payer: Self-pay

## 2018-02-19 DIAGNOSIS — M5416 Radiculopathy, lumbar region: Secondary | ICD-10-CM

## 2018-02-19 DIAGNOSIS — F330 Major depressive disorder, recurrent, mild: Secondary | ICD-10-CM

## 2018-02-19 NOTE — Telephone Encounter (Signed)
Refill request for general medication: Cymbalta 60 mg  Last office visit: 11/09/2017  Last physical exam: None indicated   Follow-ups on file. 05/12/2018

## 2018-02-21 MED ORDER — DULOXETINE HCL 60 MG PO CPEP: 60.0000 mg | ORAL_CAPSULE | Freq: Every day | ORAL | 1 refills | Status: DC

## 2018-03-22 ENCOUNTER — Other Ambulatory Visit: Payer: Self-pay | Admitting: Family Medicine

## 2018-03-22 DIAGNOSIS — M5416 Radiculopathy, lumbar region: Secondary | ICD-10-CM

## 2018-03-22 MED ORDER — CYCLOBENZAPRINE HCL 10 MG PO TABS: ORAL_TABLET | ORAL | 0 refills | Status: DC

## 2018-04-06 LAB — HEMOGLOBIN A1C: HEMOGLOBIN A1C: 5.7 (ref 4.0–6.0)

## 2018-04-06 LAB — LIPID PANEL
Cholesterol: 244 — AB (ref 0–200)
HDL: 88 — AB (ref 35–70)
LDL Cholesterol: 140
TRIGLYCERIDES: 81 (ref 40–160)

## 2018-04-06 LAB — BASIC METABOLIC PANEL: Glucose: 74

## 2018-04-29 ENCOUNTER — Other Ambulatory Visit: Payer: Self-pay | Admitting: Family Medicine

## 2018-04-29 DIAGNOSIS — E8881 Metabolic syndrome: Secondary | ICD-10-CM

## 2018-04-29 DIAGNOSIS — M5416 Radiculopathy, lumbar region: Secondary | ICD-10-CM

## 2018-04-29 DIAGNOSIS — Z9884 Bariatric surgery status: Secondary | ICD-10-CM

## 2018-05-12 ENCOUNTER — Ambulatory Visit: Payer: Managed Care, Other (non HMO) | Admitting: Family Medicine

## 2018-05-12 ENCOUNTER — Encounter: Payer: Self-pay | Admitting: Family Medicine

## 2018-05-12 VITALS — BP 108/72 | HR 116 | Temp 98.2°F | Resp 18 | Ht 64.0 in | Wt 173.3 lb

## 2018-05-12 DIAGNOSIS — Z862 Personal history of diseases of the blood and blood-forming organs and certain disorders involving the immune mechanism: Secondary | ICD-10-CM

## 2018-05-12 DIAGNOSIS — E538 Deficiency of other specified B group vitamins: Secondary | ICD-10-CM | POA: Diagnosis not present

## 2018-05-12 DIAGNOSIS — Z23 Encounter for immunization: Secondary | ICD-10-CM

## 2018-05-12 DIAGNOSIS — M5416 Radiculopathy, lumbar region: Secondary | ICD-10-CM

## 2018-05-12 DIAGNOSIS — F330 Major depressive disorder, recurrent, mild: Secondary | ICD-10-CM | POA: Diagnosis not present

## 2018-05-12 DIAGNOSIS — E559 Vitamin D deficiency, unspecified: Secondary | ICD-10-CM

## 2018-05-12 DIAGNOSIS — E8881 Metabolic syndrome: Secondary | ICD-10-CM

## 2018-05-12 DIAGNOSIS — Z9884 Bariatric surgery status: Secondary | ICD-10-CM

## 2018-05-12 DIAGNOSIS — Z1211 Encounter for screening for malignant neoplasm of colon: Secondary | ICD-10-CM

## 2018-05-12 DIAGNOSIS — R000 Tachycardia, unspecified: Secondary | ICD-10-CM

## 2018-05-12 DIAGNOSIS — E785 Hyperlipidemia, unspecified: Secondary | ICD-10-CM

## 2018-05-12 MED ORDER — METFORMIN HCL ER 500 MG PO TB24: ORAL_TABLET | ORAL | 1 refills | Status: DC

## 2018-05-12 MED ORDER — GABAPENTIN 300 MG PO CAPS: ORAL_CAPSULE | ORAL | 1 refills | Status: DC

## 2018-05-12 MED ORDER — CYANOCOBALAMIN 1000 MCG/ML IJ SOLN
1000.0000 ug | Freq: Once | INTRAMUSCULAR | Status: AC
  Administered 2018-05-12: 1000 ug via INTRAMUSCULAR

## 2018-05-12 MED ORDER — DULOXETINE HCL 60 MG PO CPEP: 60.0000 mg | ORAL_CAPSULE | Freq: Every day | ORAL | 1 refills | Status: DC

## 2018-05-12 NOTE — Progress Notes (Signed)
Name: Ashlee Cruz   MRN: [id number]    DOB: 1965/10/17   Date:05/12/2018       Progress Note  Subjective  Chief Complaint  Chief Complaint  Patient presents with  . Follow-up    6 mth f/u  . Hypertension  . Dysmetabolic syndrome  . Depression  . vitamin D deficiency  . B12 deficiency  . Dyslipidemia  . Tachycardia  . Diastolic dysfunction  . left lumbar radiculitis  . Medication Refill  . Review Labs  . Immunizations  . Bariatric surgery    HPI  Left lower back pain: she had back surgery done by Dr. Consuella Lose in July 2017 and was doing great, but a few months after the surgery she had a flare. She was seen by Dr. Kathyrn Sheriff and had steroid injection end of November 2017and symptoms improved a little, she had another injection January 2018 .  Back pain is chronic and aching  3/10 but at times it burning that goes down to lateral thigh and sometimes down to her left foot, a little worse over the past month but not as severe as her last  flare back 03/2017 but did not need an injection at that time, taking Flexeril and still has medication at home, she states cymbalta helps with pain level. She states she would like to try PT again.  Major Depression Mild Recurrent:she is now on Cymbalta to help with pain and mood, still worries about turning 52 yo and her life expectancy, her brother died at age 58 and is really worrying him . She is able to focus, motivation is good. discussed seeing therapist and she will think about it. Phq 9 reviewed   Bariatric Surgery: she stopped taking all supplementsagain, explained importance of compliance with supplements, she has a history of B12, D and iron deficiency anemia.Sheis getting B12 injectionsmonthly. She is back on Metformin now, but has been skipping doses to stretch it out. Trying to eat healthier, she will try to exercise more She is now overweight Metabolic Syndrome: last [id number] work was 5.7% , she lost some weight since  last visit. She denies polyphagia, polydipsia or polyuria.  History of iron deficiency : not taking supplements at this time. We will recheck labs  Tachycardia: she used to take beta-blocker, but bp dropped so she has been off for years.She was seen by Dr. Saunders Revel and was advised to decrease caffeine intake and exercise more, echo was normal. She states heart rate at home is around 110.    Patient Active Problem List   Diagnosis Date Noted  . Tachycardia 05/19/2017  . Palpitations 05/19/2017  . Fibroid uterus 12/20/2015  . Class 1 obesity 12/13/2015  . History of iron deficiency anemia 10/23/2015  . Left lumbar radiculitis 10/23/2015  . Absence of menstruation 04/26/2015  . B12 deficiency 04/26/2015  . Depression, major, recurrent, mild ([place]) 04/26/2015  . Dyslipidemia 04/26/2015  . Dysmetabolic syndrome 36/62/9476  . Bariatric surgery status 04/26/2015  . H/O: HTN (hypertension) 04/26/2015  . Obesity (BMI 30.0-34.9) 04/26/2015  . Psoriasis 04/26/2015  . Allergic rhinitis, seasonal 04/26/2015  . Vitamin D deficiency 04/26/2015  . Bilateral knee pain 04/26/2015    Past Surgical History:  Procedure Laterality Date  . BARIATRIC SURGERY    . CHOLECYSTECTOMY    . DILATION AND CURETTAGE OF UTERUS      Family History  Problem Relation Age of Onset  . Diabetes Mother   . Skin cancer Mother   . Diabetes Father   .  COPD Father   . Seizures Father   . Heart disease Father 62       CABG  . Diabetes Brother   . Multiple sclerosis Brother   . Stroke Paternal Grandmother   . Eczema Son     Social History   Socioeconomic History  . Marital status: Married    Spouse name: Lennette Bihari  . Number of children: 1  . Years of education: Not on file  . Highest education level: Associate degree: occupational, Hotel manager, or vocational program  Occupational History  . Not on file  Social Needs  . Financial resource strain: Not hard at all  . Food insecurity:    Worry: Never true     Inability: Never true  . Transportation needs:    Medical: No    Non-medical: No  Tobacco Use  . Smoking status: Never Smoker  . Smokeless tobacco: Never Used  Substance and Sexual Activity  . Alcohol use: No    Alcohol/week: 0.0 standard drinks  . Drug use: No  . Sexual activity: Yes    Partners: Male    Birth control/protection: None  Lifestyle  . Physical activity:    Days per week: 7 days    Minutes per session: 30 min  . Stress: Not at all  Relationships  . Social connections:    Talks on phone: More than three times a week    Gets together: More than three times a week    Attends religious service: More than 4 times per year    Active member of club or organization: Yes    Attends meetings of clubs or organizations: More than 4 times per year    Relationship status: Married  . Intimate partner violence:    Fear of current or ex partner: No    Emotionally abused: No    Physically abused: No    Forced sexual activity: No  Other Topics Concern  . Not on file  Social History Narrative  . Not on file     Current Outpatient Medications:  .  acetaminophen (TYLENOL) 500 MG tablet, Take 1,000 mg by mouth as needed for moderate pain., Disp: , Rfl:  .  Apremilast (OTEZLA) 30 MG TABS, Take 1 tablet by mouth 2 (two) times daily. For psoriasis, Disp: , Rfl:  .  cyclobenzaprine (FLEXERIL) 10 MG tablet, TAKE 1 TABLET BY MOUTH 3  TIMES DAILY AS NEEDED FOR  MUSCLE SPASM(S), Disp: 90 tablet, Rfl: 0 .  DULoxetine (CYMBALTA) 60 MG capsule, Take 1 capsule (60 mg total) by mouth daily., Disp: 90 capsule, Rfl: 1 .  gabapentin (NEURONTIN) 300 MG capsule, TAKE 1 CAPSULE(300 MG) BY MOUTH AT BEDTIME, Disp: 90 capsule, Rfl: 0 .  metFORMIN (GLUCOPHAGE-XR) 500 MG 24 hr tablet, TAKE 1 TABLET BY MOUTH DAILY WITH BREAKFAST, Disp: 90 tablet, Rfl: 0 .  fluticasone (FLONASE) 50 MCG/ACT nasal spray, Place 2 sprays into both nostrils daily. (Patient not taking: Reported on 05/12/2018), Disp: 16 g, Rfl:  0 .  omeprazole (PRILOSEC) 40 MG capsule, Take 1 capsule (40 mg total) by mouth daily. (Patient not taking: Reported on 05/12/2018), Disp: 30 capsule, Rfl: 0 .  triamcinolone cream (KENALOG) 0.1 %, , Disp: , Rfl: 2  Current Facility-Administered Medications:  .  cyanocobalamin ((VITAMIN B-12)) injection 1,000 mcg, 1,000 mcg, Subcutaneous, Q30 days, Steele Sizer, MD, 1,000 mcg at 11/09/17 0944  Allergies  Allergen Reactions  . Other Other (See Comments)    Any fragrant smell sets her off  .  Codeine   . Latex   . Bacitracin-Polymyxin B Rash    redness if left on for long periods   states no to latex   use paper tape please    I personally reviewed active problem list, medication list, allergies, family history, social history with the patient/caregiver today.   ROS  Constitutional: Negative for fever or significant  weight change.  Respiratory: Negative for cough and shortness of breath.   Cardiovascular: Negative for chest pain or palpitations.   Gastrointestinal: Negative for abdominal pain, no bowel changes.  Musculoskeletal: Negative for gait problem or joint swelling.  Skin: Negative for rash.  Neurological: Negative for dizziness or headache.  No other specific complaints in a complete review of systems (except as listed in HPI above).  Objective  Vitals:   05/12/18 0812  BP: 108/72  Pulse: (!) 116  Resp: 18  Temp: 98.2 F (36.8 C)  TempSrc: Oral  SpO2: 97%  Weight: 173 lb 4.8 oz (78.6 kg)  Height: 5\' 4"  (1.626 m)    Body mass index is 29.75 kg/m.  Physical Exam  Constitutional: Patient appears well-developed and well-nourished. Obese No distress.  HEENT: head atraumatic, normocephalic, pupils equal and reactive to light,  neck supple, throat within normal limits Cardiovascular: Normal rate, regular rhythm and normal heart sounds.  No murmur heard. No BLE edema. Pulmonary/Chest: Effort normal and breath sounds normal. No respiratory distress. Abdominal:  Soft.  There is no tenderness. Muscular Skeletal: pain during palpation of left lower spine, negative straight leg raise  Psychiatric: Patient has a normal mood and affect. behavior is normal. Judgment and thought content normal.  PHQ2/9: Depression screen [facility] 2/9 05/12/2018 05/12/2018 11/09/2017 06/30/2017 03/26/2017  Decreased Interest 0 0 0 0 1  Down, Depressed, Hopeless 0 0 0 0 0  PHQ - 2 Score 0 0 0 0 1  Altered sleeping 1 - 1 1 -  Tired, decreased energy 1 - 1 1 -  Change in appetite 0 - 1 0 -  Feeling bad or failure about yourself  0 - 0 1 -  Trouble concentrating 0 - 0 0 -  Moving slowly or fidgety/restless 0 - 0 0 -  Suicidal thoughts 0 - 0 0 -  PHQ-9 Score 2 - 3 3 -  Difficult doing work/chores Not difficult at all - Not difficult at all Not difficult at all -    GAD 7 : Generalized Anxiety Score 05/12/2018 11/09/2017  Nervous, Anxious, on Edge 0 1  Control/stop worrying 1 1  Worry too much - different things 1 1  Trouble relaxing 1 0  Restless 0 0  Easily annoyed or irritable 0 0  Afraid - awful might happen 1 0  Total GAD 7 Score 4 3     Fall Risk: Fall Risk  05/12/2018 11/09/2017 06/30/2017 03/26/2017 12/24/2016  Falls in the past year? No No No Yes Yes  Comment - - - - -  Number falls in past yr: - - - 1 1  Injury with Fall? - - - Yes Yes  Comment - - - - Little Toe on Right Foot    Functional Status Survey: Is the patient deaf or have difficulty hearing?: No Does the patient have difficulty seeing, even when wearing glasses/contacts?: No Does the patient have difficulty concentrating, remembering, or making decisions?: No Does the patient have difficulty walking or climbing stairs?: No Does the patient have difficulty dressing or bathing?: No Does the patient have difficulty doing errands alone such  as visiting a doctor's office or shopping?: No    Assessment & Plan  1. Depression, major, recurrent, mild ([facility])  - DULoxetine (CYMBALTA) 60 MG capsule; Take 1  capsule (60 mg total) by mouth daily.  Dispense: 90 capsule; Refill: 1  2. Need for influenza vaccination  - Flu Vaccine QUAD 6+ mos PF IM (Fluarix Quad PF)  3. B12 deficiency  - cyanocobalamin ((VITAMIN B-12)) injection 1,000 mcg - CBC with Differential/Platelet - Vitamin B12  4. Dysmetabolic syndrome  - Comprehensive metabolic panel - metFORMIN (GLUCOPHAGE-XR) 500 MG 24 hr tablet; TAKE 1 TABLET BY MOUTH DAILY WITH BREAKFAST  Dispense: 90 tablet; Refill: 1  5. Bariatric surgery status  - metFORMIN (GLUCOPHAGE-XR) 500 MG 24 hr tablet; TAKE 1 TABLET BY MOUTH DAILY WITH BREAKFAST  Dispense: 90 tablet; Refill: 1  6. Left lumbar radiculitis  - Ambulatory referral to Physical Therapy - DULoxetine (CYMBALTA) 60 MG capsule; Take 1 capsule (60 mg total) by mouth daily.  Dispense: 90 capsule; Refill: 1 - gabapentin (NEURONTIN) 300 MG capsule; TAKE 1 CAPSULE(300 MG) BY MOUTH AT BEDTIME  Dispense: 90 capsule; Refill: 1  7. Tachycardia  Seen by Dr. Saunders Revel in the past  8. Dyslipidemia   9. Vitamin D deficiency  - VITAMIN D 25 Hydroxy (Vit-D Deficiency, Fractures)  10. Colon cancer screening  FIT test from [place]   11. History of iron deficiency anemia  - CBC with Differential/Platelet

## 2018-05-27 ENCOUNTER — Other Ambulatory Visit: Payer: Self-pay | Admitting: Family Medicine

## 2018-05-27 DIAGNOSIS — M5416 Radiculopathy, lumbar region: Secondary | ICD-10-CM

## 2018-05-27 MED ORDER — CYCLOBENZAPRINE HCL 10 MG PO TABS: ORAL_TABLET | ORAL | 0 refills | Status: DC

## 2018-05-27 NOTE — Telephone Encounter (Signed)
Refill request for general medication. Flexeril to [facility].   Last office visit 05/12/2018   Follow up on 11/10/2018

## 2018-06-02 ENCOUNTER — Ambulatory Visit: Payer: Managed Care, Other (non HMO) | Attending: Family Medicine | Admitting: Physical Therapy

## 2018-06-02 ENCOUNTER — Other Ambulatory Visit: Payer: Self-pay

## 2018-06-02 ENCOUNTER — Encounter: Payer: Self-pay | Admitting: Physical Therapy

## 2018-06-02 VITALS — HR 84

## 2018-06-02 DIAGNOSIS — G8929 Other chronic pain: Secondary | ICD-10-CM | POA: Diagnosis present

## 2018-06-02 DIAGNOSIS — M6281 Muscle weakness (generalized): Secondary | ICD-10-CM

## 2018-06-02 DIAGNOSIS — M5442 Lumbago with sciatica, left side: Secondary | ICD-10-CM | POA: Diagnosis present

## 2018-06-02 NOTE — Therapy (Signed)
[facility] [address], [place], [address] Phone: [phone]   Fax:  [phone]  Physical Therapy Evaluation  Patient Details  Name: Ashlee Cruz MRN: [id number] Date of Birth: 03-30-1966 Referring Provider (PT): Steele Sizer, MD   Encounter Date: 06/02/2018   SUBJECTIVE: HISTORY: Patient is a 52 y.o. female referred to outpatient physical therapy with referral for left lumbar radiculitis.   Pateint reports her pain started without apparent injury near 06/2015. She was sitting at her home office when her back and left leg started to hurt. She was able to go to her doctor the same day and remembers needing her husband to help her get home from the doctor's office because her pain continued to get worse. Over 9 months she attempted conservative management strategies including chiropractic (but not including PT) without success and eventually had surgery near 02/2016. She is unable to remember what the procedure was called but states she did not have any hardware placed at that time. She reports good results from the surgery with lessening of the leg pain. She also has a history of 3 lumbar injections that also helped temporarily, the last was about a year and a half prior to PT evaluation.   She now has continued pain of lesser extent that is constant in her back and left glute and radiates at times with or without paresthesia down to her left toes. She states she feels the pain is staying about the same or possibly a little worse over time. She currently manages pain with occasional tylenol/muscle relaxers, and activity modification. She reports her pain often gets worse about 2 hours after the aggravating activity.   She elected to come to physical therapy because she thought exercises and strengthening will reduce pain and she does not want to have surgery again. She would like to learn techniques to help manager  her pain, prevent problems in the future, be able to interact with her 4 year old son fully (currently cannot lift him if needed), and get stronger.   Pre Surgery MRI report impression: 1. At L5-S1 there is a large left paracentral disc extrusion with severe mass effect on the left intraspinal S1 nerve root and posterior displacement of the left intraspinal S2 nerve root. 2. At L1-2 there is a broad central disc protrusion impressing on the ventral thecal sac.  SPECIAL SCREENING QUESTIONS Recent bowel or bladder function abnormality: if back is hurting really bad it is hard to have BM due to increased discomfort. Reports increased pain with coughing or sneezing.   PT End of Session - 06/02/18 0759    Visit Number  1    Number of Visits  13    Date for PT Re-Evaluation  07/14/18    Authorization Type  [employer] 1/10    PT Start Time  0800    PT Stop Time  0858    PT Time Calculation (min)  58 min    Activity Tolerance  Patient tolerated treatment well    Behavior During Therapy  WFL for tasks assessed/performed       Past Medical History:  Diagnosis Date  . Allergic rhinitis   . Depression   . Hyperlipidemia   . Hypertension   . Metabolic syndrome   . Obesity   . Psoriasis     Past Surgical History:  Procedure Laterality Date  . BARIATRIC SURGERY    . CHOLECYSTECTOMY    . DILATION AND CURETTAGE  OF UTERUS    . LUMBAR SPINE SURGERY Left 02/2016   Pt reports lumbar surgery without placement of instrumentation to decompress nerve on left side.     Vitals:   06/02/18 0808  Pulse: 84     Subjective Assessment - 06/02/18 0818    Pertinent History  lumbar surgery without placement of instrumentation (possibly disectomy) 02/2016, depression, bilateral knee pain, B12 deficiency, dysmetabolic syndrome, history of bariatric surgery, hypertension    Limitations  Sitting;Other (comment);Standing;Walking;Lifting;House hold activities   traveling in the car   How long can you sit  comfortably?  30 min    How long can you stand comfortably?  about 1 hour (pain starts 2 hours after she stops standing)    How long can you walk comfortably?  30 min    Diagnostic tests  MRI prior to surgery 01/2016: see description in note.    Patient Stated Goals  get stronger, prevent pain from getting worse, and feel better, learn better techniques to replace things that she may be doing wrong.  Pick up 19 year old son.     Currently in Pain?  Yes    Pain Score  4     Pain Location  Back    Pain Orientation  Left;Posterior;Lower    Pain Descriptors / Indicators  Tightness;Other (Comment)   "not long enough"   Pain Type  Chronic pain    Pain Radiating Towards  left LE to toes, intermittantly. Worst at left glute.     Pain Onset  More than a month ago    Pain Frequency  Constant    Aggravating Factors   prolonged standing on concrete, prolonged sitting, lifting, bending, prolonged walking.     Pain Relieving Factors  tylenol, muscle relaxers, lying down (laying on left side), changing positions    Effect of Pain on Daily Activities  disrupts usual homemaking, ADLs, IADLs, and work activities.          [facility] PT Assessment - 06/02/18 0924      Assessment   Medical Diagnosis  Left lumbar radiculitis    Referring Provider (PT)  Steele Sizer, MD    Onset Date/Surgical Date  06/19/15    Prior Therapy  no PT. Had chiropractic care prior to surgery without improvement.       Precautions   Precautions  None      Restrictions   Weight Bearing Restrictions  No    Other Position/Activity Restrictions  none      Balance Screen   Has the patient fallen in the past 6 months  No   pt reports feeling like her balance has worsened recently   Has the patient had a decrease in activity level because of a fear of falling?   Yes    Is the patient reluctant to leave their home because of a fear of falling?   No      Home Environment   Living Environment  Unsure    Additional Comments  lives  with husband and son. Did not ask about home.       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, lots of standing for presentations, educator in community    Leisure  spending time with 32 year old son, shopping with friends, spending time with family      Cognition  Overall Cognitive Status  Within Functional Limits for tasks assessed    Attention  Focused    Focused Attention  Appears intact    Memory  Appears intact    Awareness  Appears intact    Problem Solving  Appears intact    Executive Function  --   appears intact   Behaviors  --   Appropriate for situation     Observation/Other Assessments   Observations  Pt demonstrates forward head posture, reduced lumbar curve, rounded shoulders, no obvious lateral shift    Focus on Therapeutic Outcomes (FOTO)   44    Modified [place]  30%      Sensation   Light Touch  Appears Intact;Impaired Detail    Light Touch Impaired Details  Impaired LLE   BLE WNL except L L4     Tone   Assessment Location  Right Lower Extremity;Left Lower Extremity      ROM / Strength   AROM / PROM / Strength  Strength;AROM      AROM   Overall AROM   --    Overall AROM Comments  BLE appears grossly intact for normal ADLs    Right Hip Extension  20    Right Hip External Rotation   20    Lumbar Flexion  to mid tibia bilaterally, no gross deviations, reproduced concordant pain in left leg    Lumbar Extension  75%, mild pain in left low back    Lumbar - Right Side Bend  R side glide: WFL    Lumbar - Left Side Bend  L side glide: range grossly WFL, painful    Lumbar - Right Rotation  WFL slight discomfort    Lumbar - Left Rotation  WFL, slight discomfort      Strength   Overall Strength Comments  L and R great toe extensoin: 4+/5    Right/Left Hip  Right;Left    Right Hip Flexion  4/5    Right Hip Extension  4/5    Right  Hip External Rotation   4/5    Right Hip Internal Rotation  4/5    Left Hip Flexion  4/5    Left Hip Extension  4-/5    Left Hip External Rotation  4-/5    Left Hip Internal Rotation  4/5    Right/Left Knee  Right;Left    Right Knee Flexion  4/5    Right Knee Extension  5/5    Left Knee Flexion  4/5    Left Knee Extension  4+/5    Right/Left Ankle  Right;Left    Right Ankle Dorsiflexion  4/5    Right Ankle Plantar Flexion  4+/5    Right Ankle Eversion  4/5    Left Ankle Dorsiflexion  3+/5    Left Ankle Plantar Flexion  4/5    Left Ankle Eversion  4/5      Special Tests   Lumbar Tests  Slump Test      Slump test   Findings  Positive    Side  Left    Comment  pain with position, but lifting headincreased pain      other   Side   Left    Comments  Repeated lumbar extension in standing: decreased, no better; Repeated lumbar extension in lying (prone press up): decreased/centralizing, better (but still remains faint in glute distribution)      Bed Mobility   Bed Mobility  --   painful at times  Reflexes:  - Quadriceps reflex (L4): R = 2+, L = 2+. - Achilles reflex (S1): absent bilaterally (pt unable to relax)  Objective measurements completed on examination: See above findings.    TREATMENT:   Therapeutic exercise: to centralize symptoms and improve ROM and strength required for successful completion of functional activities.  - Prone press ups, 3x10 with breaks in between to centralize symptoms and ambulatory assessment to check response. Required cuing to relax spine and use arms for motion and education on the purpose and interpretation of exercise.  - Trial of postural correction in seated with lumbar roll, required education on the importance of avoiding pronged lumbar flexion at this time.   Home Exercise Program: Access Code: [email or web address]  URL: [email or web address]  Date: 06/02/2018  Prepared by: Rosita Kea   Exercises  Prone Press Up - 10-15  reps - 1 sets - 2 seconds hold - 4x daily   Posture Correction with Lumbar Roll (from HEP2go) Sit in firm chair with hips as far back as possible and feet flat on floor. Place roll or rolled towel behind lower back and sit back so back is supported by roll.  Patient's response to skilled services: Patient tolerated initial evaluation and treatment well with reduced pain and discomfort to 1/10 in the low back and left glute by end of session. Initial treatment focused on establishing HEP and education. Patient gained relief from lumbar extension exercises. Patient's symptoms centralized to middle of lumbar spine in prone position with 3 sets of 10 prone press ups. When she got up the pain returned to left glute but much lower intensity. Additional extension was not continued at this time due to pt report of pain onset 2 hours after over-exertion, so want to see how she responds to this new motion. Pt reported improved comfort when sitting with lumbar roll.     PT Education - 06/02/18 1026    Education Details  Education on diagnosis, prognosis, POC, anatomy and physiology of current condition, proper execution of therex and HEP    Person(s) Educated  Patient    Methods  Explanation;Demonstration;Handout    Comprehension  Verbalized understanding;Returned demonstration       PT Short Term Goals - 06/02/18 1046      PT SHORT TERM GOAL #1   Title  Be independent with initial home program for self-management of symptoms.    Baseline  initial HEP provided today    Time  2    Period  Weeks    Status  New    Target Date  06/16/18        PT Long Term Goals - 06/02/18 1047      PT LONG TERM GOAL #1   Title  Be independent with a long-term home exercise program for self-management of symptoms.     Baseline  initial HEP provided at eval    Time  6    Period  Weeks    Status  New    Target Date  07/14/18      PT LONG TERM GOAL #2   Title  Improve FOTO score to equal or greater than 61  to demonstrate improved self-reported function.     Baseline  IE 06/02/18: 44 (adjusted score)    Time  6    Period  Weeks    Status  New    Target Date  07/14/18      PT LONG TERM GOAL #4   Title  Reduce pain associated with functional  activities to equal or less than 3/10.     Baseline  Pain that requires medication 2 hours after standing and presenting for 2 hours.     Time  6    Period  Weeks    Status  New    Target Date  07/14/18      PT LONG TERM GOAL #5   Title  Be able to squat to chair height with proper form without limitation due to back pain in order to improve ability to lift and complete transfers during usual activities.     Baseline  Transfers uncomfortable    Time  6    Period  Weeks    Status  New    Target Date  07/14/18         Plan - 06/02/18 1032    Clinical Impression Statement  Patient is a 52 y.o. female  referred to outpatient physical therapy with a diagnosis of left lumbar radiculitis who presents with signs and symptoms consistent with low back pain with neural irritation and referral to left lower extremity. Provisional Mechanical Diagnosis and Therapy classification of lumbar derangement syndrome with extension directional preference.    History and Personal Factors relevant to plan of care:  chronic nature of symptoms, obesity, history of surgery, dysmetabolic syndrome, bilateral knee pain, hypertension, history of bariatric surgery, history of depression, lightheadedness upon standing.     Clinical Presentation  Evolving    Clinical Presentation due to:  intermittant radicular symptoms to left LE, report from pt that condition is overall getting worse    Clinical Decision Making  Moderate    Rehab Potential  Good    Clinical Impairments Affecting Rehab Potential  pain, reduced ROM, soft tissue restriction, reduced activitiy tolerance, incomplete knowledge of condition to allow for optimal self management    PT Frequency  2x / week    PT Duration   6 weeks    PT Treatment/Interventions  ADLs/Self Care Home Management;Moist Heat;Gait training;Stair training;Functional mobility training;Therapeutic activities;Therapeutic exercise;Balance training;Neuromuscular re-education;Patient/family education;Manual techniques;Dry needling;Passive range of motion;Spinal Manipulations;Joint Manipulations    PT Next Visit Plan  assess response to lumbar extension over time and adjust interventions as appropriate.     PT Home Exercise Plan  [email or web address] prone press ups 10-15 4x a day, use lumbar roll whenever sitting.     Consulted and Agree with Plan of Care  Patient       Patient will benefit from skilled therapeutic intervention in order to improve the following deficits and impairments:  Decreased balance, Decreased endurance, Decreased mobility, Difficulty walking, Hypomobility, Impaired sensation, Increased muscle spasms, Obesity, Improper body mechanics, Decreased range of motion, Decreased activity tolerance, Decreased strength, Impaired flexibility, Postural dysfunction, Pain, Other (comment), Dizziness(Difficulty with prolonged sitting, traveling, standing, presenting at work, prolonged standing, bending, lifting. )  Visit Diagnosis: Chronic left-sided low back pain with left-sided sciatica  Muscle weakness (generalized)     Problem List Patient Active Problem List   Diagnosis Date Noted  . Tachycardia 05/19/2017  . Palpitations 05/19/2017  . Fibroid uterus 12/20/2015  . History of iron deficiency anemia 10/23/2015  . Left lumbar radiculitis 10/23/2015  . Absence of menstruation 04/26/2015  . B12 deficiency 04/26/2015  . Depression, major, recurrent, mild ([facility]) 04/26/2015  . Dyslipidemia 04/26/2015  . Dysmetabolic syndrome 95/18/8416  . Bariatric surgery status 04/26/2015  . H/O: HTN (hypertension) 04/26/2015  . Psoriasis 04/26/2015  . Allergic rhinitis, seasonal 04/26/2015  . Vitamin D deficiency 04/26/2015  . Bilateral knee pain  04/26/2015    Everlean Alstrom. Graylon Good, PT, DPT 06/02/18, 11:02 AM  [facility] [address] [place], [address] Phone: [phone]   Fax:  [phone]  Name: Ashlee Cruz MRN: [id number] Date of Birth: March 17, 1966

## 2018-06-02 NOTE — Patient Instructions (Signed)
HEP  Access Code: [email or web address]  URL: [email or web address]  Date: 06/02/2018  Prepared by: Rosita Kea   Exercises  Prone Press Up - 10-15 reps - 1 sets - 2 seconds hold - 4x daily   Posture Correction with Lumbar Roll Sit in firm chair with hips as far back as possible and feet flat on floor. Place roll or rolled towel behind lower back and sit back so back is supported by roll.

## 2018-06-07 ENCOUNTER — Ambulatory Visit: Payer: Managed Care, Other (non HMO) | Admitting: Physical Therapy

## 2018-06-07 ENCOUNTER — Encounter: Payer: Self-pay | Admitting: Physical Therapy

## 2018-06-07 DIAGNOSIS — M5442 Lumbago with sciatica, left side: Principal | ICD-10-CM

## 2018-06-07 DIAGNOSIS — G8929 Other chronic pain: Secondary | ICD-10-CM

## 2018-06-07 DIAGNOSIS — M6281 Muscle weakness (generalized): Secondary | ICD-10-CM

## 2018-06-07 NOTE — Therapy (Signed)
[facility] [address], [place], [address] Phone: [phone]   Fax:  [phone]  Physical Therapy Treatment   Patient Details  Name: Ashlee Cruz MRN: [id number] Date of Birth: 04-03-1966 Referring Provider (PT): Steele Sizer, MD   Encounter Date: 06/07/2018  PT End of Session - 06/08/18 0911    Visit Number  1    Number of Visits  13    Date for PT Re-Evaluation  07/14/18    Authorization Type  [employer]    Authorization Time Period  Cert period: 83/41/9622-29/79/8921    Authorization - Visit Number  2    Authorization - Number of Visits  10    PT Start Time  0845    PT Stop Time  0930    PT Time Calculation (min)  45 min    Activity Tolerance  Patient tolerated treatment well    Behavior During Therapy  [facility] for tasks assessed/performed       Past Medical History:  Diagnosis Date  . Allergic rhinitis   . Depression   . Hyperlipidemia   . Hypertension   . Metabolic syndrome   . Obesity   . Psoriasis     Past Surgical History:  Procedure Laterality Date  . BARIATRIC SURGERY    . CHOLECYSTECTOMY    . DILATION AND CURETTAGE OF UTERUS    . LUMBAR SPINE SURGERY Left 02/2016   Pt reports lumbar surgery without placement of instrumentation to decompress nerve on left side.     There were no vitals filed for this visit.  Subjective Assessment - 06/07/18 0838    Subjective  Patient reports after last visit her back felt better but she had pain in the cervical spine region, so she reduced the reps for 10 reps which seemed to solve the problem. She also had a bad day on Saturday (had to take medications but felt better by the evening). she thinks it felt worse after sitting for 2 hours at a game night on Friday night. Reports good compliance with HEP.  Upon arrival she states her back feels "about normal" better than Saturday (7/10 in low back with referral to lateral left knee). She currently has  her worset pain in the center of the back but has some pain down to left hip.     Pertinent History  lumbar surgery without placement of instrumentation (possibly disectomy) 02/2016, depression, bilateral knee pain, B12 deficiency, dysmetabolic syndrome, history of bariatric surgery, hypertension    Limitations  Sitting;Other (comment);Standing;Walking;Lifting;House hold activities   traveling in the car   How long can you sit comfortably?  30 min    How long can you stand comfortably?  about 1 hour (pain starts 2 hours after she stops standing)    How long can you walk comfortably?  30 min    Diagnostic tests  MRI prior to surgery 01/2016: see description in note.    Patient Stated Goals  get stronger, prevent pain from getting worse, and feel better, learn better techniques to replace things that she may be doing wrong.  Pick up 11 year old son.     Currently in Pain?  Yes    Pain Score  3     Pain Location  Back    Pain Orientation  Left    Pain Type  Chronic pain    Pain Radiating Towards  referrs to left hip    Pain Onset  More than  a month ago       OBJECTIVE:  Observation: pt with right lateral shift in sitting today upon arrival. Improved by end of visit.   TREATMENT:   Therapeutic exercise: to centralize symptoms and improve ROM and strength required for successful completion of functional activities.  - Prone press ups, 3x10 with breaks in between to centralize symptoms and ambulatory assessment to check response. Required cuing to relax spine and use arms for motion and education on the purpose and interpretation of exercise.  - Lumbar AROM and ambulation/stair climbing between interventions to assess response and guide today's treatment.  - Prone press up, 2x10 with hips off center to right (to close of left), decreased, no better. - standing lateral side glide at wall (right hip to wall), x 10 with discomfort during exercise and no lasting change.  - standing lateral side glide  with 45 degree turn towards wall to achieve side glide plus lumbar extension (right hip towards wall), 3x10 with centralization and decrease in pain to 1.5/10 by end of session.  - seated lumbar extension trial of 10 reps with education on when and how to complete at home. - Education on updated HEP including handout  - Education on diagnosis, prognosis, POC, anatomy and physiology of current condition. Answered all of patient's questions to the best of my knowledge within scope of practice.     Manual therapy: to reduce pain and tissue tension, improve range of motion, neuromodulation, in order to promote improved ability to complete functional activities. - Prone press up with hips off center to right (to close of left), with lateral cliniciain overpressure, 3x10 sets with improvement of symptoms.  - Prone press ups with clinician overpressure, 2x10, decreased, no better - Prone CPA and left UPA at most tender segment in lumbar spine (L4), grade III-IV, tender on left side with reproduction of glute pain. No better, no worse after.   Home Exercise Program: Access Code: [email or web address]  URL: [email or web address]  Date: 06/07/2018  Prepared by: Sebastian Lumbar Extension Press Up - 10-15 reps - 1 second hold - 4x daily  Standing Lateral Shift correction - pain on left - 10-15 reps - 1 second hold - 4x daily  Seated Lumbar Extension - 10-15 reps - 3 second hold - 4x daily  Seated Correct Posture    Patient response to treatment:  Pt tolerated treatment well. Pt was able to complete all exercises with minimal to no lasting increase in pain or discomfort. She reported centralizing symptoms during exercises with lateral component and responded better to press ups with hips off center and clinician lateral overpressure than prone press ups alone with or without hips off center. She ended the visit with pain reduced to 1.5/10  to hip. Pt required cuing for  proper technique and to facilitate improved neuromuscular control, strength, range of motion, and functional ability.Pt is making progress towards goal at this point.      PT Education - 06/07/18 0843    Education Details  HEP frequency, techinique, therex techinque    Person(s) Educated  Patient    Methods  Explanation;Demonstration    Comprehension  Returned demonstration;Verbalized understanding       PT Short Term Goals - 06/02/18 1046      PT SHORT TERM GOAL #1   Title  Be independent with initial home program for self-management of symptoms.    Baseline  initial HEP provided today    Time  2    Period  Weeks    Status  New    Target Date  06/16/18        PT Long Term Goals - 06/02/18 1047      PT LONG TERM GOAL #1   Title  Be independent with a long-term home exercise program for self-management of symptoms.     Baseline  initial HEP provided at eval    Time  6    Period  Weeks    Status  New    Target Date  07/14/18      PT LONG TERM GOAL #2   Title  Improve FOTO score to equal or greater than 61 to demonstrate improved self-reported function.     Baseline  IE 06/02/18: 44 (adjusted score)    Time  6    Period  Weeks    Status  New    Target Date  07/14/18      PT LONG TERM GOAL #4   Title  Reduce pain associated with functional activities to equal or less than 3/10.     Baseline  Pain that requires medication 2 hours after standing and presenting for 2 hours.     Time  6    Period  Weeks    Status  New    Target Date  07/14/18      PT LONG TERM GOAL #5   Title  Be able to squat to chair height with proper form without limitation due to back pain in order to improve ability to lift and complete transfers during usual activities.     Baseline  Transfers uncomfortable    Time  6    Period  Weeks    Status  New    Target Date  07/14/18            Plan - 06/08/18 0912    Clinical Impression Statement  Pt tolerated treatment well. Pt was able to  complete all exercises with minimal to no lasting increase in pain or discomfort. She reported centralizing symptoms during exercises with lateral component and responded better to press ups with hips off center and clinician lateral overpressure than prone press ups alone with or without hips off center. She ended the visit with pain reduced to 1.5/10  to hip. Pt required cuing for proper technique and to facilitate improved neuromuscular control, strength, range of motion, and functional ability.Pt is making progress towards goal at this point.     Rehab Potential  Good    Clinical Impairments Affecting Rehab Potential  pain, reduced ROM, soft tissue restriction, reduced activitiy tolerance, incomplete knowledge of condition to allow for optimal self management    PT Frequency  2x / week    PT Duration  6 weeks    PT Treatment/Interventions  ADLs/Self Care Home Management;Moist Heat;Gait training;Stair training;Functional mobility training;Therapeutic activities;Therapeutic exercise;Balance training;Neuromuscular re-education;Patient/family education;Manual techniques;Dry needling;Passive range of motion;Spinal Manipulations;Joint Manipulations    PT Next Visit Plan  assess response to lumbar extension over time and adjust interventions as appropriate.     PT Home Exercise Plan  [email or web address] prone press ups 10-15 4x a day, use lumbar roll whenever sitting.     Consulted and Agree with Plan of Care  Patient       Patient will benefit from skilled therapeutic intervention in order to improve the following deficits and impairments:  Decreased balance, Decreased endurance, Decreased mobility, Difficulty walking, Hypomobility, Impaired sensation, Increased muscle spasms, Obesity, Improper body mechanics,  Decreased range of motion, Decreased activity tolerance, Decreased strength, Impaired flexibility, Postural dysfunction, Pain, Other (comment), Dizziness(Difficulty with prolonged sitting, traveling, standing,  presenting at work, prolonged standing, bending, lifting. )  Visit Diagnosis: Chronic left-sided low back pain with left-sided sciatica  Muscle weakness (generalized)     Problem List Patient Active Problem List   Diagnosis Date Noted  . Tachycardia 05/19/2017  . Palpitations 05/19/2017  . Fibroid uterus 12/20/2015  . History of iron deficiency anemia 10/23/2015  . Left lumbar radiculitis 10/23/2015  . Absence of menstruation 04/26/2015  . B12 deficiency 04/26/2015  . Depression, major, recurrent, mild ([place]) 04/26/2015  . Dyslipidemia 04/26/2015  . Dysmetabolic syndrome 18/40/3754  . Bariatric surgery status 04/26/2015  . H/O: HTN (hypertension) 04/26/2015  . Psoriasis 04/26/2015  . Allergic rhinitis, seasonal 04/26/2015  . Vitamin D deficiency 04/26/2015  . Bilateral knee pain 04/26/2015    Everlean Alstrom. Graylon Good, PT, DPT 06/08/2018, 9:26 AM  [facility] [address] [place], [address] Phone: [phone]   Fax:  [phone]  Name: Ashlee Cruz MRN: [id number] Date of Birth: [birth year]

## 2018-06-09 ENCOUNTER — Encounter: Payer: Self-pay | Admitting: Physical Therapy

## 2018-06-09 ENCOUNTER — Ambulatory Visit: Payer: Managed Care, Other (non HMO) | Admitting: Physical Therapy

## 2018-06-09 DIAGNOSIS — M6281 Muscle weakness (generalized): Secondary | ICD-10-CM

## 2018-06-09 DIAGNOSIS — M5442 Lumbago with sciatica, left side: Secondary | ICD-10-CM | POA: Diagnosis not present

## 2018-06-09 DIAGNOSIS — G8929 Other chronic pain: Secondary | ICD-10-CM

## 2018-06-09 NOTE — Therapy (Signed)
[facility] [address] [place], [address] Phone: [phone]   Fax:  [phone]  Physical Therapy Treatment  Patient Details  Name: Ashlee Cruz MRN: [id number] Date of Birth: 25-Jun-1966 Referring Provider (PT): Steele Sizer, MD   Encounter Date: 06/09/2018  PT End of Session - 06/09/18 1048    Visit Number  3    Number of Visits  13    Date for PT Re-Evaluation  07/14/18    Authorization Type  [employer]    Authorization Time Period  Cert period: 97/98/9211-94/17/4081    Authorization - Visit Number  3    Authorization - Number of Visits  10    PT Start Time  0850    PT Stop Time  0935    PT Time Calculation (min)  45 min    Activity Tolerance  Patient tolerated treatment well;Patient limited by pain    Behavior During Therapy  [facility] for tasks assessed/performed       Past Medical History:  Diagnosis Date  . Allergic rhinitis   . Depression   . Hyperlipidemia   . Hypertension   . Metabolic syndrome   . Obesity   . Psoriasis     Past Surgical History:  Procedure Laterality Date  . BARIATRIC SURGERY    . CHOLECYSTECTOMY    . DILATION AND CURETTAGE OF UTERUS    . LUMBAR SPINE SURGERY Left 02/2016   Pt reports lumbar surgery without placement of instrumentation to decompress nerve on left side.     There were no vitals filed for this visit.  Subjective Assessment - 06/09/18 0851    Subjective  Patient reports she very uncomfortable on monday evening after her session here. She had trouble going to sleep, had to get her son to help her pick somthing up from the floor, and take medication. She felt better by yesterday (but not better than before). She was feeling good when she left her appointment, but her pain came up about 3-4 in the afternoon which is consistent with how her symptoms have presented in the past. Today she has a headache and has taken advil for that.  Reports that she did not  try HEP when she was hurting on monday but was able to yesterday with no memorable effect.     Pertinent History  lumbar surgery without placement of instrumentation (possibly disectomy) 02/2016, depression, bilateral knee pain, B12 deficiency, dysmetabolic syndrome, history of bariatric surgery, hypertension    Limitations  Sitting;Other (comment);Standing;Walking;Lifting;House hold activities   traveling in the car   How long can you sit comfortably?  30 min    How long can you stand comfortably?  about 1 hour (pain starts 2 hours after she stops standing)    How long can you walk comfortably?  30 min    Diagnostic tests  MRI prior to surgery 01/2016: see description in note.    Patient Stated Goals  get stronger, prevent pain from getting worse, and feel better, learn better techniques to replace things that she may be doing wrong.  Pick up 13 year old son.     Currently in Pain?  Yes    Pain Score  2     Pain Location  Back    Pain Orientation  Left    Pain Type  Chronic pain    Pain Radiating Towards  to bottom of left hip    Pain Onset  More than a month  ago    Pain Frequency  Constant    Multiple Pain Sites  Yes    Pain Score  5    Pain Location  Head    Pain Orientation  Anterior   across forehead to each temple   Pain Descriptors / Indicators  Aching    Pain Type  Acute pain    Pain Onset  Yesterday    Pain Frequency  Constant        PT Education - 06/09/18 0950    Education Details  purpose of exercises, cuing for improved form. Education on updated HEP (when to do prone press up or side glide at wall with 45 degree angle) no new handout    Person(s) Educated  Patient    Methods  Explanation;Demonstration    Comprehension  Verbalized understanding;Returned demonstration      OBJECTIVE:  Observation: pt with right lateral shift in sitting today upon arrival. Left lateral shift in standing after manual right shift correction. Roughly centered in standing after left manual  lateral shift correction.  TREATMENT:  Therapeutic exercise:to centralize symptoms and improve ROM and strength required for successful completion of functional activities.  - ambulation/stair climbing between interventions to assess response and guide today's treatment.  - Prone press up, x10 with hips off center to right (to close of left) - standing lateral side glide at wall (right hip to wall), x 10 with discomfort during exercise and no lasting change.  - standing lateral side glide with 45 degree turn towards wall to achieve side glide plus lumbar extension (right hip towards wall), 2x10 with centralization and decrease in pain mildly. - Prone press ups, x15 with hands elevated to get end range stretch. Ended sesison after this exercise, pain 1.5 to mid-lower left glute.  - Education on updated HEP (when to do prone press up or side glide at wall with 45 degree angle) no new handout    Manual therapy: to reduce pain and tissue tension, improve range of motion, neuromodulation, in order to promote improved ability to complete functional activities. - Prone press up with hips off center to right (to close of left), with lateral cliniciain overpressure, x15 sets with improvement of symptoms.  - manual lateral shift correction (for right lateral shift) with assessment of response between each set, first set without extension, symptoms centralizing, 2nd set with 10 reps extension following, started to peripheralize, noted for left lateral shift in standing following.  - manual lateral shift correction (for left lateral shift), very gentle and conservative moitoring symptoms throughout until pain gone from left calf and near mid glute, attempted with extension added at end with no further effect.  Home Exercise Program: Access Code: [email or web address]  URL: [email or web address]  Date: 06/07/2018  Prepared by: Kay Lumbar Extension Press Up -  10-15 reps - 1 second hold - 4x daily   Standing Lateral Shift correction - pain on left - 10-15 reps - 1 second hold - 4x daily   Seated Lumbar Extension - 10-15 reps - 3 second hold - 4x daily   Seated Correct Posture   Patient response to treatment:  Pt tolerated treatment well. Pt was able to complete all exercises with minimal to no lasting increase in pain or discomfort. She reported centralizing symptoms during exercises with lateral component with manual overpressure with good response to manual lateral shift correction but then started to have increased symptoms and shift to the left, demonstrating some  instability in condition. Gentle reversal of lateral correction was performed manually with return of symptoms to slightly better than baseline. Ended session after symptoms improved due to potential for soreness later after more aggressive interventions for shifts to both sides were performed. Plan to be more conservative with lateral overpressure at next visit if indicated based on response. She ended the visit with pain reduced to 1.5/10  to hip. Pt required cuing for proper technique and to facilitate improved neuromuscular control, strength, range of motion, and functional ability.Pt is making progress towards goal at this point.   PT Short Term Goals - 06/02/18 1046      PT SHORT TERM GOAL #1   Title  Be independent with initial home program for self-management of symptoms.    Baseline  initial HEP provided today    Time  2    Period  Weeks    Status  New    Target Date  06/16/18        PT Long Term Goals - 06/02/18 1047      PT LONG TERM GOAL #1   Title  Be independent with a long-term home exercise program for self-management of symptoms.     Baseline  initial HEP provided at eval    Time  6    Period  Weeks    Status  New    Target Date  07/14/18      PT LONG TERM GOAL #2   Title  Improve FOTO score to equal or greater than 61 to demonstrate improved self-reported  function.     Baseline  IE 06/02/18: 44 (adjusted score)    Time  6    Period  Weeks    Status  New    Target Date  07/14/18      PT LONG TERM GOAL #4   Title  Reduce pain associated with functional activities to equal or less than 3/10.     Baseline  Pain that requires medication 2 hours after standing and presenting for 2 hours.     Time  6    Period  Weeks    Status  New    Target Date  07/14/18      PT LONG TERM GOAL #5   Title  Be able to squat to chair height with proper form without limitation due to back pain in order to improve ability to lift and complete transfers during usual activities.     Baseline  Transfers uncomfortable    Time  6    Period  Weeks    Status  New    Target Date  07/14/18            Plan - 06/09/18 1050    Clinical Impression Statement  Pt tolerated treatment well. Pt was able to complete all exercises with minimal to no lasting increase in pain or discomfort. She reported centralizing symptoms during exercises with lateral component with manual overpressure with good response to manual lateral shift correction but then started to have increased symptoms and shift to the left, demonstrating some instability in condition. Gentle reversal of lateral correction was performed manually with return of symptoms to slightly better than baseline. Ended session after symptoms improved due to potential for soreness later after more aggressive interventions for shifts to both sides were performed. Plan to be more conservative with lateral overpressure at next visit if indicated based on response. She ended the visit with pain reduced to 1.5/10  to hip. Pt  required cuing for proper technique and to facilitate improved neuromuscular control, strength, range of motion, and functional ability.Pt is making progress towards goal at this point.     Rehab Potential  Good    Clinical Impairments Affecting Rehab Potential  pain, reduced ROM, soft tissue restriction, reduced  activitiy tolerance, incomplete knowledge of condition to allow for optimal self management    PT Frequency  2x / week    PT Duration  6 weeks    PT Treatment/Interventions  ADLs/Self Care Home Management;Moist Heat;Gait training;Stair training;Functional mobility training;Therapeutic activities;Therapeutic exercise;Balance training;Neuromuscular re-education;Patient/family education;Manual techniques;Dry needling;Passive range of motion;Spinal Manipulations;Joint Manipulations    PT Next Visit Plan  assess response to lumbar extension over time and adjust interventions as appropriate. Plan to be more conservative with lateral overpressure at next visit if indicated based on response.    PT Home Exercise Plan  [email or web address] prone press ups 10-15 4x a day, use lumbar roll whenever sitting.     Consulted and Agree with Plan of Care  Patient       Patient will benefit from skilled therapeutic intervention in order to improve the following deficits and impairments:  Decreased balance, Decreased endurance, Decreased mobility, Difficulty walking, Hypomobility, Impaired sensation, Increased muscle spasms, Obesity, Improper body mechanics, Decreased range of motion, Decreased activity tolerance, Decreased strength, Impaired flexibility, Postural dysfunction, Pain, Other (comment), Dizziness(Difficulty with prolonged sitting, traveling, standing, presenting at work, prolonged standing, bending, lifting. )  Visit Diagnosis: Chronic left-sided low back pain with left-sided sciatica  Muscle weakness (generalized)     Problem List Patient Active Problem List   Diagnosis Date Noted  . Tachycardia 05/19/2017  . Palpitations 05/19/2017  . Fibroid uterus 12/20/2015  . History of iron deficiency anemia 10/23/2015  . Left lumbar radiculitis 10/23/2015  . Absence of menstruation 04/26/2015  . B12 deficiency 04/26/2015  . Depression, major, recurrent, mild ([place]) 04/26/2015  . Dyslipidemia 04/26/2015  .  Dysmetabolic syndrome 19/41/7408  . Bariatric surgery status 04/26/2015  . H/O: HTN (hypertension) 04/26/2015  . Psoriasis 04/26/2015  . Allergic rhinitis, seasonal 04/26/2015  . Vitamin D deficiency 04/26/2015  . Bilateral knee pain 04/26/2015   Everlean Alstrom. Graylon Good, PT, DPT 06/09/2018, 10:56 AM  [facility] [address] [place], [address] Phone: [phone]   Fax:  [phone]  Name: Ashlee Cruz MRN: [id number] Date of Birth: 1965/09/28

## 2018-06-14 ENCOUNTER — Encounter: Payer: Self-pay | Admitting: Physical Therapy

## 2018-06-14 ENCOUNTER — Ambulatory Visit: Payer: Managed Care, Other (non HMO) | Admitting: Physical Therapy

## 2018-06-14 DIAGNOSIS — G8929 Other chronic pain: Secondary | ICD-10-CM

## 2018-06-14 DIAGNOSIS — M5442 Lumbago with sciatica, left side: Principal | ICD-10-CM

## 2018-06-14 DIAGNOSIS — M6281 Muscle weakness (generalized): Secondary | ICD-10-CM

## 2018-06-14 NOTE — Therapy (Signed)
[facility] [address] [place], [address] Phone: [phone]   Fax:  [phone]  Physical Therapy Treatment  Patient Details  Name: Ashlee Cruz MRN: [id number] Date of Birth: [birth year] Referring Provider (PT): Steele Sizer, MD   Encounter Date: 06/14/2018    Past Medical History:  Diagnosis Date  . Allergic rhinitis   . Depression   . Hyperlipidemia   . Hypertension   . Metabolic syndrome   . Obesity   . Psoriasis     Past Surgical History:  Procedure Laterality Date  . BARIATRIC SURGERY    . CHOLECYSTECTOMY    . DILATION AND CURETTAGE OF UTERUS    . LUMBAR SPINE SURGERY Left 02/2016   Pt reports lumbar surgery without placement of instrumentation to decompress nerve on left side.     There were no vitals filed for this visit.  Subjective Assessment - 06/14/18 0843    Subjective  Patient reports 2/10 pain. She states she thinks she was sick at her last visit causing the headache. she felt better from that the next day. She did not have increased pain later in the day like she did following her last visit.  Slipped in the shower since last visit that increased her left pain a bit.  She has already done some of her HEP this morning and is doing well with 3 times per day.  Does feel it makes more difference in the morning than later in the day but  no big difference. did note some aching in left calf. hurt more when it was rainy.     Pertinent History  lumbar surgery without placement of instrumentation (possibly disectomy) 02/2016, depression, bilateral knee pain, B12 deficiency, dysmetabolic syndrome, history of bariatric surgery, hypertension    Limitations  Sitting;Other (comment);Standing;Walking;Lifting;House hold activities   traveling in the car   How long can you sit comfortably?  30 min    How long can you stand comfortably?  about 1 hour (pain starts 2 hours after she stops standing)     How long can you walk comfortably?  30 min    Diagnostic tests  MRI prior to surgery 01/2016: see description in note.    Patient Stated Goals  get stronger, prevent pain from getting worse, and feel better, learn better techniques to replace things that she may be doing wrong.  Pick up 16 year old son.     Currently in Pain?  Yes    Pain Score  2     Pain Location  Back    Pain Orientation  Left    Pain Descriptors / Indicators  Aching    Pain Type  Chronic pain    Pain Radiating Towards  near left ishial tuberisity.     Pain Onset  More than a month ago    Multiple Pain Sites  No    Pain Onset  Yesterday      OBJECTIVE:  Observation: pt with right lateral shift in sitting today upon arrival. Mild left lateral shift in standing after repeated extension with hips off center to right.    TREATMENT:   Therapeutic exercise: to centralize symptoms and improve ROM and strength required for successful completion of functional activities.  - ambulation/stair climbing between interventions to assess response and guide today's treatment.  - Prone press up, 3x10 with hips off center to right (to close down left lumbar spine) against door frame to prevent loss of hip  shift. With arms elevated on foam roll and on floor.  - standing lateral side glide with 45 degree turn towards wall to achieve side glide plus lumbar extension (right hip towards wall), 2x10 with centralization and decrease in pain mildly. - Prone press ups, 2x15 with hands elevated to get end range stretch.  - Education on updated HEP no new handout      Manual therapy: to reduce pain and tissue tension, improve range of motion, neuromodulation, in order to promote improved ability to complete functional activities. - Prone press up with hips off center to right (to close of left), with lateral cliniciain overpressure, 3x15 sets with improvement of symptoms.  - supine flexion rotation, (LE to left), with manual OP x 10. Decreased  lumbar pain, increased glute pain   Home Exercise Program: Access Code: [email or web address]  URL: [email or web address]  Date: 06/07/2018  Prepared by: [facility] Lumbar Extension Press Up - 10-15 reps - 1 second hold - 4x daily   Standing Lateral Shift correction - pain on left - 10-15 reps - 1 second hold - 4x daily   Seated Lumbar Extension - 10-15 reps - 3 second hold - 4x daily   Seated Correct Posture    Patient response to treatment:  Pt tolerated treatment well. Pt was able to complete all exercises with minimal to no lasting increase in pain or discomfort. She reported centralizing symptoms during exercises with lateral component with manual overpressure. Did not complete manual shift correction due to instability of symptoms with this at last treatment.  Good but incomplete response to prone press up with lateral overpressure (manual and in the door frame). Educated to perform in door frame at home. Poor response to flexion rotation mobilization. Not indicated to continue at future visits. Discomfort in left calf abolished today. No significant change overall in back and glute pain by end of visit (but had times it felt better during session). Pt required cuing for proper technique and to facilitate improved neuromuscular control, strength, range of motion, and functional ability.Pt is making progress towards goal at this point. Recommend progressing to trunk, LE, and functional strengthening next visit.   PT Education - 06/14/18 [phone]    Education Details  purpose of exercises, therex cuing    Person(s) Educated  Patient    Methods  Explanation;Demonstration    Comprehension  Verbalized understanding;Returned demonstration       PT Short Term Goals - 06/02/18 1046      PT SHORT TERM GOAL #1   Title  Be independent with initial home program for self-management of symptoms.    Baseline  initial HEP provided today    Time  2    Period   Weeks    Status  New    Target Date  06/16/18        PT Long Term Goals - 06/02/18 1047      PT LONG TERM GOAL #1   Title  Be independent with a long-term home exercise program for self-management of symptoms.     Baseline  initial HEP provided at eval    Time  6    Period  Weeks    Status  New    Target Date  07/14/18      PT LONG TERM GOAL #2   Title  Improve FOTO score to equal or greater than 61 to demonstrate improved self-reported function.     Baseline  IE 06/02/18: 44 (  adjusted score)    Time  6    Period  Weeks    Status  New    Target Date  07/14/18      PT LONG TERM GOAL #4   Title  Reduce pain associated with functional activities to equal or less than 3/10.     Baseline  Pain that requires medication 2 hours after standing and presenting for 2 hours.     Time  6    Period  Weeks    Status  New    Target Date  07/14/18      PT LONG TERM GOAL #5   Title  Be able to squat to chair height with proper form without limitation due to back pain in order to improve ability to lift and complete transfers during usual activities.     Baseline  Transfers uncomfortable    Time  6    Period  Weeks    Status  New    Target Date  07/14/18            Plan - 06/14/18 0843    Clinical Impression Statement  Pt tolerated treatment well. Pt was able to complete all exercises with minimal to no lasting increase in pain or discomfort. She reported centralizing symptoms during exercises with lateral component with manual overpressure. Did not complete manual shift correction due to instability of symptoms with this at last treatment.  Good but incomplete response to prone press up with lateral overpressure (manual and in the door frame). Educated to perform in door frame at home. Poor response to flexion rotation mobilization. Not indicated to continue at future visits. Discomfort in left calf abolished today. No significant change overall in back and glute pain by end of visit  (but had times it felt better during session). Pt required cuing for proper technique and to facilitate improved neuromuscular control, strength, range of motion, and functional ability.Pt is making progress towards goal at this point. Recommend progressing to trunk, LE, and functional strengthening next visit.     Rehab Potential  Good    Clinical Impairments Affecting Rehab Potential  pain, reduced ROM, soft tissue restriction, reduced activitiy tolerance, incomplete knowledge of condition to allow for optimal self management    PT Frequency  2x / week    PT Duration  6 weeks    PT Treatment/Interventions  ADLs/Self Care Home Management;Moist Heat;Gait training;Stair training;Functional mobility training;Therapeutic activities;Therapeutic exercise;Balance training;Neuromuscular re-education;Patient/family education;Manual techniques;Dry needling;Passive range of motion;Spinal Manipulations;Joint Manipulations    PT Next Visit Plan  assess response to lumbar extension over time and adjust interventions as appropriate. Initiate progressive LE, trunk, and funcitonal strengthening at next visit.     PT Home Exercise Plan  [email or web address] prone press ups 10-15 4x a day, use lumbar roll whenever sitting.     Consulted and Agree with Plan of Care  Patient       Patient will benefit from skilled therapeutic intervention in order to improve the following deficits and impairments:  Decreased balance, Decreased endurance, Decreased mobility, Difficulty walking, Hypomobility, Impaired sensation, Increased muscle spasms, Obesity, Improper body mechanics, Decreased range of motion, Decreased activity tolerance, Decreased strength, Impaired flexibility, Postural dysfunction, Pain, Other (comment), Dizziness(Difficulty with prolonged sitting, traveling, standing, presenting at work, prolonged standing, bending, lifting. )  Visit Diagnosis: Chronic left-sided low back pain with left-sided sciatica  Muscle weakness  (generalized)     Problem List Patient Active Problem List   Diagnosis Date Noted  . Tachycardia 05/19/2017  .  Palpitations 05/19/2017  . Fibroid uterus 12/20/2015  . History of iron deficiency anemia 10/23/2015  . Left lumbar radiculitis 10/23/2015  . Absence of menstruation 04/26/2015  . B12 deficiency 04/26/2015  . Depression, major, recurrent, mild ([place]) 04/26/2015  . Dyslipidemia 04/26/2015  . Dysmetabolic syndrome 80/32/1224  . Bariatric surgery status 04/26/2015  . H/O: HTN (hypertension) 04/26/2015  . Psoriasis 04/26/2015  . Allergic rhinitis, seasonal 04/26/2015  . Vitamin D deficiency 04/26/2015  . Bilateral knee pain 04/26/2015    Nancy Nordmann, PT, DPT 06/14/2018, 6:02 PM  [facility] [address], [place], [address] Phone: [phone]   Fax:  [phone]  Name: CAMARY SOSA MRN: [id number] Date of Birth: Jan 14, 1966

## 2018-06-16 ENCOUNTER — Ambulatory Visit: Payer: Managed Care, Other (non HMO) | Admitting: Physical Therapy

## 2018-06-16 ENCOUNTER — Encounter: Payer: Self-pay | Admitting: Physical Therapy

## 2018-06-16 DIAGNOSIS — M5442 Lumbago with sciatica, left side: Principal | ICD-10-CM

## 2018-06-16 DIAGNOSIS — G8929 Other chronic pain: Secondary | ICD-10-CM

## 2018-06-16 DIAGNOSIS — M6281 Muscle weakness (generalized): Secondary | ICD-10-CM

## 2018-06-16 NOTE — Therapy (Signed)
[facility] [address], [place], [address] Phone: [phone]   Fax:  [phone]  Physical Therapy Treatment  Patient Details  Name: Ashlee Cruz MRN: [id number] Date of Birth: [birth year] Referring Provider (PT): Steele Sizer, MD   Encounter Date: 06/16/2018  PT End of Session - 06/16/18 0839    Visit Number  4    Number of Visits  13    Date for PT Re-Evaluation  07/14/18    Authorization Type  [employer]    Authorization Time Period  Cert period: [id number]    Authorization - Visit Number  4    Authorization - Number of Visits  10    PT Start Time  0840    PT Stop Time  0925    PT Time Calculation (min)  45 min    Activity Tolerance  Patient tolerated treatment well;Patient limited by pain    Behavior During Therapy  [facility] for tasks assessed/performed       Past Medical History:  Diagnosis Date  . Allergic rhinitis   . Depression   . Hyperlipidemia   . Hypertension   . Metabolic syndrome   . Obesity   . Psoriasis     Past Surgical History:  Procedure Laterality Date  . BARIATRIC SURGERY    . CHOLECYSTECTOMY    . DILATION AND CURETTAGE OF UTERUS    . LUMBAR SPINE SURGERY Left 02/2016   Pt reports lumbar surgery without placement of instrumentation to decompress nerve on left side.     There were no vitals filed for this visit.  Subjective Assessment - 06/16/18 0839    Subjective  Patient reports she had a sharp pain sitting in the waiting room but it was fleeting. Denies pain in left calf since last visit. Earlier this morning her pain was 2/10 and is upon arrival. Reports no excessive soreness following last visit and good follow through with HEP (5x a day).     Pertinent History  lumbar surgery without placement of instrumentation (possibly disectomy) 02/2016, depression, bilateral knee pain, B12 deficiency, dysmetabolic syndrome, history of bariatric surgery, hypertension     Limitations  Sitting;Other (comment);Standing;Walking;Lifting;House hold activities   traveling in the car   How long can you sit comfortably?  30 min    How long can you stand comfortably?  about 1 hour (pain starts 2 hours after she stops standing)    How long can you walk comfortably?  30 min    Diagnostic tests  MRI prior to surgery 01/2016: see description in note.    Patient Stated Goals  get stronger, prevent pain from getting worse, and feel better, learn better techniques to replace things that she may be doing wrong.  Pick up 51 year old son.     Currently in Pain?  Yes    Pain Score  2     Pain Location  Back    Pain Orientation  Left    Pain Descriptors / Indicators  Aching    Pain Type  Chronic pain    Pain Radiating Towards  radiages towards left glute, currently only in left low back    Pain Onset  More than a month ago    Pain Frequency  Constant    Multiple Pain Sites  No    Pain Onset  Yesterday       OBJECTIVE:  Observation: did not note any lateral shift in sitting.   TREATMENT:  Therapeutic  exercise:to centralize symptoms and improve ROM and strength required for successful completion of functional activities.  -ambulation/stair climbing between interventions to assess response and guide today's treatment. -Treadmill up to 2.2 mph 0% grade For improved lower extremity mobility, muscular endurance, and weightbearing activity tolerance; and to induce the analgesic effect of aerobic exercise, stimulate improved joint nutrition, and prepare body structures and systems for following interventions. x 7 minutes during subjective exam. - Prone press up, x10 with hips off center to right (to close down left lumbar spine). With arms elevated.  - Prone press ups, 2x10with hands elevated to get end range stretch.  -Standing rows with scapular retraction for improved postural and shoulder girdle, trunk, and postural strengthening and mobility. Required instruction for  technique and cuing to retract, posteriorly tilt, and depress scapulae. x20 reps. Weight 10#. -Standing pallof press (multifidus press) with single green theraband 2x10 each side. Cuing for trunk control.  - standing isometric abdominal bracing with press down on theraball placed on elevated plinth, 5 sec hold, x 10 front and each lateral.  - standing lateral side glide with 45 degree turn towards wall to achieve side glide plus lumbar extension (right hip towards wall),x10 with centralization and decrease in painmildly.   Manual therapy:to reduce pain and tissue tension, improve range of motion, neuromodulation, in order to promote improved ability to complete functional activities. - Prone press up with hips off center to right (to close of left), with lateral cliniciain overpressure, x5sets with increased pain in left glute.  - prone press ups with clinician overpressure at ~L5, 3x10, with better during and no better or mild improvement after.  - Prone CPA and right UPA at L5-L2 (focus on L5) grade III-IV to improve motion and reduce pain (better, no better or mildly better at L5, L4; painful, no worse at L2-3 with radiation to left base of spine.    Home Exercise Program: Access Code: [email or web address]  URL: [email or web address]  Date: 06/07/2018  Prepared by: [facility] Lumbar Extension Press Up - 10-15 reps - 1 second hold - 4x daily   Standing Lateral Shift correction - pain on left - 10-15 reps - 1 second hold - 4x daily   Seated Lumbar Extension - 10-15 reps - 3 second hold - 4x daily   Seated Correct Posture  Patient response to treatment:  Pt tolerated treatment well. Pt was able to complete all exercises with minimal to no lasting increase in pain or discomfort. Overall she experienced trend of centralization of low back pian with soreness at bilateral and central low back at end of visit. Demonstrates good carry over of exercise  technique from previous visits. Did not require as much lateral force today and may be able to progress to purely sagittal force next visit.She reported centralizing symptoms during exercises with manual overpressure. Pt required cuing for proper technique and to facilitate improved neuromuscular control, strength, range of motion, and functional ability.Pt is making progress towards goalat this point.Recommend progressing to trunk, LE, and functional strengthening next visit.    PT Short Term Goals - 06/16/18 0940      PT SHORT TERM GOAL #1   Title  Be independent with initial home program for self-management of symptoms.    Baseline  initial HEP provided today    Time  2    Period  Weeks    Status  Achieved    Target Date  06/16/18  PT Long Term Goals - 06/02/18 1047      PT LONG TERM GOAL #1   Title  Be independent with a long-term home exercise program for self-management of symptoms.     Baseline  initial HEP provided at eval    Time  6    Period  Weeks    Status  New    Target Date  07/14/18      PT LONG TERM GOAL #2   Title  Improve FOTO score to equal or greater than 61 to demonstrate improved self-reported function.     Baseline  IE 06/02/18: 44 (adjusted score)    Time  6    Period  Weeks    Status  New    Target Date  07/14/18      PT LONG TERM GOAL #4   Title  Reduce pain associated with functional activities to equal or less than 3/10.     Baseline  Pain that requires medication 2 hours after standing and presenting for 2 hours.     Time  6    Period  Weeks    Status  New    Target Date  07/14/18      PT LONG TERM GOAL #5   Title  Be able to squat to chair height with proper form without limitation due to back pain in order to improve ability to lift and complete transfers during usual activities.     Baseline  Transfers uncomfortable    Time  6    Period  Weeks    Status  New    Target Date  07/14/18            Plan - 06/16/18 7619     Clinical Impression Statement  Pt tolerated treatment well. Pt was able to complete all exercises with minimal to no lasting increase in pain or discomfort. Overall she experienced trend of centralization of low back pian with soreness at bilateral and central low back at end of visit. Demonstrates good carry over of exercise technique from previous visits. Did not require as much lateral force today and may be able to progress to purely sagittal force next visit.She reported centralizing symptoms during exercises with manual overpressure. Pt required cuing for proper technique and to facilitate improved neuromuscular control, strength, range of motion, and functional ability.Pt is making progress towards goalat this point.Recommend progressing to trunk, LE, and functional strengthening next visit.     Rehab Potential  Good    Clinical Impairments Affecting Rehab Potential  pain, reduced ROM, soft tissue restriction, reduced activitiy tolerance, incomplete knowledge of condition to allow for optimal self management    PT Frequency  2x / week    PT Duration  6 weeks    PT Treatment/Interventions  ADLs/Self Care Home Management;Moist Heat;Gait training;Stair training;Functional mobility training;Therapeutic activities;Therapeutic exercise;Balance training;Neuromuscular re-education;Patient/family education;Manual techniques;Dry needling;Passive range of motion;Spinal Manipulations;Joint Manipulations    PT Next Visit Plan  assess response to lumbar extension over time and adjust interventions as appropriate. consider sagittal plane force next visit. Initiate progressive LE, trunk, and funcitonal strengthening at next visit.     PT Home Exercise Plan  [email or web address] prone press ups 10-15 4x a day, use lumbar roll whenever sitting.     Consulted and Agree with Plan of Care  Patient       Patient will benefit from skilled therapeutic intervention in order to improve the following deficits and impairments:   Decreased balance, Decreased endurance, Decreased mobility,  Difficulty walking, Hypomobility, Impaired sensation, Increased muscle spasms, Obesity, Improper body mechanics, Decreased range of motion, Decreased activity tolerance, Decreased strength, Impaired flexibility, Postural dysfunction, Pain, Other (comment), Dizziness(Difficulty with prolonged sitting, traveling, standing, presenting at work, prolonged standing, bending, lifting. )  Visit Diagnosis: Chronic left-sided low back pain with left-sided sciatica  Muscle weakness (generalized)     Problem List Patient Active Problem List   Diagnosis Date Noted  . Tachycardia 05/19/2017  . Palpitations 05/19/2017  . Fibroid uterus 12/20/2015  . History of iron deficiency anemia 10/23/2015  . Left lumbar radiculitis 10/23/2015  . Absence of menstruation 04/26/2015  . B12 deficiency 04/26/2015  . Depression, major, recurrent, mild ([place]) 04/26/2015  . Dyslipidemia 04/26/2015  . Dysmetabolic syndrome 02/11/9484  . Bariatric surgery status 04/26/2015  . H/O: HTN (hypertension) 04/26/2015  . Psoriasis 04/26/2015  . Allergic rhinitis, seasonal 04/26/2015  . Vitamin D deficiency 04/26/2015  . Bilateral knee pain 04/26/2015    Nancy Nordmann, PT, DPT 06/16/2018, 9:41 AM  [facility] [address], [place], [address] Phone: [phone]   Fax:  [phone]  Name: Ashlee Cruz MRN: [id number] Date of Birth: 02-Apr-1966

## 2018-06-21 ENCOUNTER — Ambulatory Visit: Payer: Managed Care, Other (non HMO) | Attending: Family Medicine | Admitting: Physical Therapy

## 2018-06-21 ENCOUNTER — Encounter: Payer: Self-pay | Admitting: Physical Therapy

## 2018-06-21 DIAGNOSIS — G8929 Other chronic pain: Secondary | ICD-10-CM | POA: Diagnosis present

## 2018-06-21 DIAGNOSIS — M5442 Lumbago with sciatica, left side: Secondary | ICD-10-CM | POA: Diagnosis present

## 2018-06-21 DIAGNOSIS — M6281 Muscle weakness (generalized): Secondary | ICD-10-CM | POA: Insufficient documentation

## 2018-06-21 NOTE — Therapy (Signed)
[facility] [address], [place], [address] Phone: [phone]   Fax:  [phone]  Physical Therapy Treatment  Patient Details  Name: Ashlee Cruz MRN: [id number] Date of Birth: 07-01-1966 Referring Provider (PT): Steele Sizer, MD   Encounter Date: 06/21/2018  PT End of Session - 06/21/18 0912    Visit Number  5    Number of Visits  13    Date for PT Re-Evaluation  07/14/18    Authorization Type  [employer]    Authorization Time Period  Cert period: 93/71/6967-89/38/1017    Authorization - Visit Number  5    Authorization - Number of Visits  10    PT Start Time  0900    PT Stop Time  0945    PT Time Calculation (min)  45 min    Activity Tolerance  Patient tolerated treatment well;Patient limited by pain    Behavior During Therapy  [facility] for tasks assessed/performed       Past Medical History:  Diagnosis Date  . Allergic rhinitis   . Depression   . Hyperlipidemia   . Hypertension   . Metabolic syndrome   . Obesity   . Psoriasis     Past Surgical History:  Procedure Laterality Date  . BARIATRIC SURGERY    . CHOLECYSTECTOMY    . DILATION AND CURETTAGE OF UTERUS    . LUMBAR SPINE SURGERY Left 02/2016   Pt reports lumbar surgery without placement of instrumentation to decompress nerve on left side.     There were no vitals filed for this visit.  Subjective Assessment - 06/21/18 0858    Subjective  Pt reports saturday and sunday she had high levels of pain up to 5/10 last night in the central low back for no apparent reason. Yesterday she took muscle relaxers and advil (which she takes only when needed due to irritation of stomach - may have helped go to sleep). She tried some walking yesterday and it felt [place]. She was having difficulty standing from sitting after prolonged sitting. This morning she has minimal pain in the left upper and lower glute region. She  had no pain in this locatoin over the weekend.  She states she did the wall side glide x3 yesterday and the prone press up with hips off center x 2 and it seemed harder than usual.  She states she may have felt better than usual the two days after last visit prior to having in creased pain.     Pertinent History  lumbar surgery without placement of instrumentation (possibly disectomy) 02/2016, depression, bilateral knee pain, B12 deficiency, dysmetabolic syndrome, history of bariatric surgery, hypertension    Limitations  Sitting;Other (comment);Standing;Walking;Lifting;House hold activities   traveling in the car   How long can you sit comfortably?  30 min    How long can you stand comfortably?  about 1 hour (pain starts 2 hours after she stops standing)    How long can you walk comfortably?  30 min    Diagnostic tests  MRI prior to surgery 01/2016: see description in note.    Patient Stated Goals  get stronger, prevent pain from getting worse, and feel better, learn better techniques to replace things that she may be doing wrong.  Pick up 51 year old son.     Currently in Pain?  Yes    Pain Score  1     Pain Location  Back    Pain Orientation  Mid;Left;Lower    Pain Descriptors / Indicators  Aching    Pain Type  Chronic pain    Pain Onset  More than a month ago    Pain Onset  [place]        PT Education - 06/21/18 0911    Education Details  purpose of exercises, therex cuing, HEP     Person(s) Educated  Patient    Methods  Explanation;Demonstration    Comprehension  Verbalized understanding;Returned demonstration      OBJECTIVE:  Observation: did not note any lateral shift in sitting.   TREATMENT:  Therapeutic exercise:to centralize symptoms and improve ROM and strength required for successful completion of functional activities.  -ambulation/stair climbing between interventions to assess response and guide today's treatment. -Treadmill up to 2.6 mph 0% grade For improved  lower extremity mobility, muscular endurance, and weightbearing activity tolerance; and to induce the analgesic effect of aerobic exercise, stimulate improved joint nutrition, and prepare body structures and systems for following interventions. x 5 minutes during subjective exam. - Prone press up,3x10 with hips off center to right (to closedownleftlumbar spine). With arms elevated.  -Standing rows with scapular retraction for improved postural and shoulder girdle, trunk, and postural strengthening and mobility. Required instruction for technique and cuing to retract, posteriorly tilt, and depress scapulae. X10 at 15#, x 10 at 20#. (fatigue at 10).  -Standing pallof press (multifidus press) with double green theraband 2x10 each side. Cuing for trunk control and shoulder girdle posture. - standing isometric abdominal bracing with bilateral shoulder extension at cable pull anchored overhead, 5 sec holds second set, 2  x 10, 10# - standing lateral side glide with 45 degree turn towards wall to achieve side glide plus lumbar extension (right hip towards wall),3x10 and one set with hips turned towards wall.    Manual therapy:to reduce pain and tissue tension, improve range of motion, neuromodulation, in order to promote improved ability to complete functional activities. - Prone press up with hips off center to right (to close of left), with lateral cliniciain overpressure,x5sets with increased pain in left glute.  - prone press ups with clinician overpressure at ~L3-L4, 3x10, with better during and no better or mild improvement after.  - Prone CPA and right UPA at L5-L2 (focus on L3) grade III-IV to improve motion and reduce pain (decrease, better at L2-3 with radiation to left base of spine).   Home Exercise Program: Access Code: [email or web address]  URL: [email or web address]  Date: 06/07/2018  Prepared by: [place] Lumbar Extension Press Up - 10-15  reps - 1 second hold - 4x daily   Standing Lateral Shift correction - pain on left - 10-15 reps - 1 second hold - 4x daily   Seated Lumbar Extension - 10-15 reps - 3 second hold - 4x daily   Seated Correct Posture  Patient response to treatment:  Pt tolerated treatment well. Pt was able to complete all exercises with minimal to no lasting increase in pain or discomfort. Overall she experienced trend of centralization of low back pian with soreness at bilateral and central low back at end of visit. Demonstrates good carry over of exercise technique from previous visits. Continued to requred lateral force today as well as clinician overpressure.She reported centralizing symptoms during exercises with manual overpressure. Pt required cuing for proper technique and to facilitate improved neuromuscular control, strength, range of motion, and functional ability.Pt is making progress towards  goalat this point.Recommend continued progressing to trunk, LE, and functional strengthening next visit.   PT Short Term Goals - 06/16/18 0940      PT SHORT TERM GOAL #1   Title  Be independent with initial home program for self-management of symptoms.    Baseline  initial HEP provided today    Time  2    Period  Weeks    Status  Achieved    Target Date  06/16/18        PT Long Term Goals - 06/02/18 1047      PT LONG TERM GOAL #1   Title  Be independent with a long-term home exercise program for self-management of symptoms.     Baseline  initial HEP provided at eval    Time  6    Period  Weeks    Status  New    Target Date  07/14/18      PT LONG TERM GOAL #2   Title  Improve FOTO score to equal or greater than 61 to demonstrate improved self-reported function.     Baseline  IE 06/02/18: 44 (adjusted score)    Time  6    Period  Weeks    Status  New    Target Date  07/14/18      PT LONG TERM GOAL #4   Title  Reduce pain associated with functional activities to equal or less than 3/10.      Baseline  Pain that requires medication 2 hours after standing and presenting for 2 hours.     Time  6    Period  Weeks    Status  New    Target Date  07/14/18      PT LONG TERM GOAL #5   Title  Be able to squat to chair height with proper form without limitation due to back pain in order to improve ability to lift and complete transfers during usual activities.     Baseline  Transfers uncomfortable    Time  6    Period  Weeks    Status  New    Target Date  07/14/18            Plan - 06/21/18 1005    Clinical Impression Statement  Pt tolerated treatment well. Pt was able to complete all exercises with minimal to no lasting increase in pain or discomfort. Overall she experienced trend of centralization of low back pian with soreness at bilateral and central low back at end of visit. Demonstrates good carry over of exercise technique from previous visits. Continued to requred lateral force today as well as clinician overpressure.She reported centralizing symptoms during exercises with manual overpressure. Pt required cuing for proper technique and to facilitate improved neuromuscular control, strength, range of motion, and functional ability.Pt is making progress towards goalat this point.Recommend continued progressing to trunk, LE, and functional strengthening next visit.     Rehab Potential  Good    Clinical Impairments Affecting Rehab Potential  pain, reduced ROM, soft tissue restriction, reduced activitiy tolerance, incomplete knowledge of condition to allow for optimal self management    PT Frequency  2x / week    PT Duration  6 weeks    PT Treatment/Interventions  ADLs/Self Care Home Management;Moist Heat;Gait training;Stair training;Functional mobility training;Therapeutic activities;Therapeutic exercise;Balance training;Neuromuscular re-education;Patient/family education;Manual techniques;Dry needling;Passive range of motion;Spinal Manipulations;Joint Manipulations    PT  Next Visit Plan  assess response to lumbar extension over time and adjust interventions as appropriate. consider  sagittal plane force next visit. Cont progressive LE, trunk, and funcitonal strengthening at next visit.     PT Home Exercise Plan  [email or web address] prone press ups 10-15 4x a day, use lumbar roll whenever sitting.     Consulted and Agree with Plan of Care  Patient       Patient will benefit from skilled therapeutic intervention in order to improve the following deficits and impairments:  Decreased balance, Decreased endurance, Decreased mobility, Difficulty walking, Hypomobility, Impaired sensation, Increased muscle spasms, Obesity, Improper body mechanics, Decreased range of motion, Decreased activity tolerance, Decreased strength, Impaired flexibility, Postural dysfunction, Pain, Other (comment), Dizziness(Difficulty with prolonged sitting, traveling, standing, presenting at work, prolonged standing, bending, lifting. )  Visit Diagnosis: Chronic left-sided low back pain with left-sided sciatica  Muscle weakness (generalized)     Problem List Patient Active Problem List   Diagnosis Date Noted  . Tachycardia 05/19/2017  . Palpitations 05/19/2017  . Fibroid uterus 12/20/2015  . History of iron deficiency anemia 10/23/2015  . Left lumbar radiculitis 10/23/2015  . Absence of menstruation 04/26/2015  . B12 deficiency 04/26/2015  . Depression, major, recurrent, mild ([place]) 04/26/2015  . Dyslipidemia 04/26/2015  . Dysmetabolic syndrome 79/15/0569  . Bariatric surgery status 04/26/2015  . H/O: HTN (hypertension) 04/26/2015  . Psoriasis 04/26/2015  . Allergic rhinitis, seasonal 04/26/2015  . Vitamin D deficiency 04/26/2015  . Bilateral knee pain 04/26/2015    Nancy Nordmann, PT, DPT 06/21/2018, 10:06 AM  [facility] [address] [place], [address] Phone: [phone]   Fax:  [phone]  Name: Ashlee Cruz MRN: [id number] Date of Birth: March 27, 1966

## 2018-06-23 ENCOUNTER — Ambulatory Visit: Payer: Managed Care, Other (non HMO) | Admitting: Physical Therapy

## 2018-06-23 ENCOUNTER — Encounter: Payer: Self-pay | Admitting: Physical Therapy

## 2018-06-23 DIAGNOSIS — G8929 Other chronic pain: Secondary | ICD-10-CM

## 2018-06-23 DIAGNOSIS — M5442 Lumbago with sciatica, left side: Secondary | ICD-10-CM | POA: Diagnosis not present

## 2018-06-23 DIAGNOSIS — M6281 Muscle weakness (generalized): Secondary | ICD-10-CM

## 2018-06-23 NOTE — Therapy (Signed)
[facility] [address], [place], [address] Phone: [phone]   Fax:  [phone]  Physical Therapy Treatment  Patient Details  Name: Ashlee Cruz MRN: [id number] Date of Birth: [birth year] Referring Provider (PT): Steele Sizer, MD   Encounter Date: 06/23/2018  PT End of Session - 06/23/18 0858    Visit Number  6    Number of Visits  13    Date for PT Re-Evaluation  07/14/18    Authorization Type  [employer]    Authorization Time Period  Cert period: 69/62/9528-41/32/4401    Authorization - Visit Number  6    Authorization - Number of Visits  10    PT Start Time  0900    PT Stop Time  0945    PT Time Calculation (min)  45 min    Activity Tolerance  Patient tolerated treatment well;Patient limited by pain    Behavior During Therapy  [facility] for tasks assessed/performed       Past Medical History:  Diagnosis Date  . Allergic rhinitis   . Depression   . Hyperlipidemia   . Hypertension   . Metabolic syndrome   . Obesity   . Psoriasis     Past Surgical History:  Procedure Laterality Date  . BARIATRIC SURGERY    . CHOLECYSTECTOMY    . DILATION AND CURETTAGE OF UTERUS    . LUMBAR SPINE SURGERY Left 02/2016   Pt reports lumbar surgery without placement of instrumentation to decompress nerve on left side.     There were no vitals filed for this visit.  Subjective Assessment - 06/23/18 0858    Subjective  Patient reports she is feeling well today with pain only with certain movements (0/27 or lower). She states she felt okay after last session with no excessive soreness. She has decided the side glide at the wall works the best for her to improve her pain and she has been performing this regularly.     Pertinent History  lumbar surgery without placement of instrumentation (possibly disectomy) 02/2016, depression, bilateral knee pain, B12 deficiency, dysmetabolic syndrome, history of bariatric surgery,  hypertension    Limitations  Sitting;Other (comment);Standing;Walking;Lifting;House hold activities   traveling in the car   How long can you sit comfortably?  30 min    How long can you stand comfortably?  about 1 hour (pain starts 2 hours after she stops standing)    How long can you walk comfortably?  30 min    Diagnostic tests  MRI prior to surgery 01/2016: see description in note.    Patient Stated Goals  get stronger, prevent pain from getting worse, and feel better, learn better techniques to replace things that she may be doing wrong.  Pick up 51 year old son.     Currently in Pain?  Yes    Pain Score  1     Pain Location  Back    Pain Orientation  Left    Pain Descriptors / Indicators  Aching    Pain Radiating Towards  currently only in left low back    Pain Onset  More than a month ago    Pain Onset  --         OBJECTIVE:  Observation:did not note any lateral shift in sitting.  TREATMENT:  Therapeutic exercise:to centralize symptoms and improve ROM and strength required for successful completion of functional activities.  -ambulation/stair climbing between interventions to assess response and guide  today's treatment. -Treadmillup to 2.7 mph 0%grade For improved lower extremity mobility, muscular endurance, and weightbearing activity tolerance; and to induce the analgesic effect of aerobic exercise, stimulate improved joint nutrition, and prepare body structures and systems for following interventions. x 5 minutes during subjective exam.  -Standing rows with scapular retraction for improved postural and shoulder girdle, trunk, and posturalstrengthening and mobility. Required instruction for technique and cuing to retract, posteriorly tilt, and depress scapulae. 3x10 at 20#. (fatigue at 10).  -Standing pallof press (multifidus press) withdouble greentheraband 3x10 each side. Cuing for trunk control and shoulder girdle posture. - standing isometric abdominal bracing  with bilateral shoulder extension at cable pull anchored overhead, 5 sec holds second set, 2  x 10, 15# - standing lateral side glide with 20 degree turn towards wall to achieve side glide plus slight lumbar extension (right hip towards wall),2x10  - Side stepping with red theraband wrapped around legs x 1 and held at umbilicus to activate core and hip stabilizers and abductors in functional weightbearing position. Cuing to activate abdominals. Stepping a few steps out and in as allowed by band 2x10 each side. CGA with gait belt for safety. - Standing with hands against wall bird dog (alternating shoulder flexion/contralateral hip extension with core muscles braced) , with cuing help keep core stabilized with abdominal brace. x10 each way.  Manual therapy:to reduce pain and tissue tension, improve range of motion, neuromodulation, in order to promote improved ability to complete functional activities. - Prone press up with hips off center to right (to close of left), with lateral cliniciain overpressure,4x 10 reps as tolerated (2 sets at start of session and 2 sets at conclusion)  Home Exercise Program: Access Code: [email or web address]  URL: [email or web address]  Date: 06/07/2018  Prepared by:  Lumbar Extension Press Up - 10-15 reps - 1 second hold - 4x daily   Standing Lateral Shift correction - pain on left - 10-15 reps - 1 second hold - 4x daily   Seated Lumbar Extension - 10-15 reps - 3 second hold - 4x daily   Seated Correct Posture  Patient response to treatment:  Pt tolerated treatment well. Pt presents overall better today than at last visit. Pt was able to complete all exercises with minimal to no lasting increase in pain or discomfort.She had some discomfort with side stepping and bird dog exercises that dissapated with rest and with specific exercise. Pain otherwise remained centralized to left lumbar spine. Continues to  demonstrates good carry over of exercise technique from previous visits. Continued to requred lateral force today as well as clinician overpressure but with less intensity.Pt required cuing for proper technique and to facilitate improved neuromuscular control, strength, range of motion, and functional ability.Pt is making progress towards goalat this point.Recommend continued progressing to trunk, LE, and functional strengthening next visit.   PT Education - 06/23/18 0858    Education Details  purpose of exercises, therex cuing    Person(s) Educated  Patient    Methods  Explanation;Demonstration    Comprehension  Verbalized understanding;Returned demonstration       PT Short Term Goals - 06/16/18 0940      PT SHORT TERM GOAL #1   Title  Be independent with initial home program for self-management of symptoms.    Baseline  initial HEP provided today    Time  2    Period  Weeks    Status  Achieved    Target Date  06/16/18        PT Long Term Goals - 06/02/18 1047      PT LONG TERM GOAL #1   Title  Be independent with a long-term home exercise program for self-management of symptoms.     Baseline  initial HEP provided at eval    Time  6    Period  Weeks    Status  New    Target Date  07/14/18      PT LONG TERM GOAL #2   Title  Improve FOTO score to equal or greater than 61 to demonstrate improved self-reported function.     Baseline  IE 06/02/18: 44 (adjusted score)    Time  6    Period  Weeks    Status  New    Target Date  07/14/18      PT LONG TERM GOAL #4   Title  Reduce pain associated with functional activities to equal or less than 3/10.     Baseline  Pain that requires medication 2 hours after standing and presenting for 2 hours.     Time  6    Period  Weeks    Status  New    Target Date  07/14/18      PT LONG TERM GOAL #5   Title  Be able to squat to chair height with proper form without limitation due to back pain in order to improve ability to lift and  complete transfers during usual activities.     Baseline  Transfers uncomfortable    Time  6    Period  Weeks    Status  New    Target Date  07/14/18            Plan - 06/23/18 1319    Clinical Impression Statement  Pt tolerated treatment well. Pt presents overall better today than at last visit. Pt was able to complete all exercises with minimal to no lasting increase in pain or discomfort.She had some discomfort with side stepping and bird dog exercises that dissapated with rest and with specific exercise. Pain otherwise remained centralized to left lumbar spine. Continues to demonstrates good carry over of exercise technique from previous visits. Continued to requred lateral force today as well as clinician overpressure but with less intensity.Pt required cuing for proper technique and to facilitate improved neuromuscular control, strength, range of motion, and functional ability.Pt is making progress towards goalat this point.Recommend continued progressing to trunk, LE, and functional strengthening next visit.    Rehab Potential  Good    Clinical Impairments Affecting Rehab Potential  pain, reduced ROM, soft tissue restriction, reduced activitiy tolerance, incomplete knowledge of condition to allow for optimal self management    PT Frequency  2x / week    PT Duration  6 weeks    PT Treatment/Interventions  ADLs/Self Care Home Management;Moist Heat;Gait training;Stair training;Functional mobility training;Therapeutic activities;Therapeutic exercise;Balance training;Neuromuscular re-education;Patient/family education;Manual techniques;Dry needling;Passive range of motion;Spinal Manipulations;Joint Manipulations    PT Next Visit Plan   Cont progressive LE, trunk, and funcitonal strengthening at next visit with specific exercise and manual interventions as needed.     PT Home Exercise Plan  Medbridge Code: [email or web address]     Consulted and Agree with Plan of Care  Patient       Patient will  benefit from skilled therapeutic intervention in order to improve the following deficits and impairments:  Decreased balance, Decreased endurance, Decreased mobility, Difficulty walking, Hypomobility, Impaired sensation, Increased muscle spasms, Obesity, Improper  body mechanics, Decreased range of motion, Decreased activity tolerance, Decreased strength, Impaired flexibility, Postural dysfunction, Pain, Other (comment), Dizziness  Visit Diagnosis: Chronic left-sided low back pain with left-sided sciatica  Muscle weakness (generalized)     Problem List Patient Active Problem List   Diagnosis Date Noted  . Tachycardia 05/19/2017  . Palpitations 05/19/2017  . Fibroid uterus 12/20/2015  . History of iron deficiency anemia 10/23/2015  . Left lumbar radiculitis 10/23/2015  . Absence of menstruation 04/26/2015  . B12 deficiency 04/26/2015  . Depression, major, recurrent, mild ([place]) 04/26/2015  . Dyslipidemia 04/26/2015  . Dysmetabolic syndrome 59/53/9672  . Bariatric surgery status 04/26/2015  . H/O: HTN (hypertension) 04/26/2015  . Psoriasis 04/26/2015  . Allergic rhinitis, seasonal 04/26/2015  . Vitamin D deficiency 04/26/2015  . Bilateral knee pain 04/26/2015    Nancy Nordmann, PT, DPT 06/23/2018, 1:20 PM  [facility] [address], [place], [address] Phone: [phone]   Fax:  [phone]  Name: Ashlee Cruz MRN: [id number] Date of Birth: May 20, 1966

## 2018-06-28 ENCOUNTER — Encounter: Payer: Self-pay | Admitting: Physical Therapy

## 2018-06-28 ENCOUNTER — Ambulatory Visit: Payer: Managed Care, Other (non HMO) | Admitting: Physical Therapy

## 2018-06-28 DIAGNOSIS — M5442 Lumbago with sciatica, left side: Principal | ICD-10-CM

## 2018-06-28 DIAGNOSIS — M6281 Muscle weakness (generalized): Secondary | ICD-10-CM

## 2018-06-28 DIAGNOSIS — G8929 Other chronic pain: Secondary | ICD-10-CM

## 2018-06-28 NOTE — Therapy (Signed)
[facility] [address], [place], [address] Phone: [phone]   Fax:  [phone]  Physical Therapy Treatment  Patient Details  Name: Ashlee Cruz MRN: [id number] Date of Birth: Jun 04, 1966 Referring Provider (PT): Steele Sizer, MD   Encounter Date: 06/28/2018  PT End of Session - 06/28/18 0902    Visit Number  7    Number of Visits  13    Date for PT Re-Evaluation  07/14/18    Authorization Type  [employer]    Authorization Time Period  Cert period: 16/38/4536-46/80/3212    Authorization - Visit Number  7    Authorization - Number of Visits  10    PT Start Time  0900    PT Stop Time  0945    PT Time Calculation (min)  45 min    Activity Tolerance  Patient tolerated treatment well;Patient limited by pain    Behavior During Therapy  [facility] for tasks assessed/performed       Past Medical History:  Diagnosis Date  . Allergic rhinitis   . Depression   . Hyperlipidemia   . Hypertension   . Metabolic syndrome   . Obesity   . Psoriasis     Past Surgical History:  Procedure Laterality Date  . BARIATRIC SURGERY    . CHOLECYSTECTOMY    . DILATION AND CURETTAGE OF UTERUS    . LUMBAR SPINE SURGERY Left 02/2016   Pt reports lumbar surgery without placement of instrumentation to decompress nerve on left side.     There were no vitals filed for this visit.  Subjective Assessment - 06/28/18 0904    Subjective  Patient reports she is feeling well today and had a pretty good weekend. She was able to drive 3 hours each way (with one stop each way) to visit her mother this weekend without much difficulty. She reports her pain is lower than 1/10 today in her left lower glute. She was concerned about the drive and was very pleased she did okay. She used her car lumbar support. Reports felling good after last vist. No excessive pain or soreness.  No pain radiating down leg since last visit. She has a 2 hour standing  event this thursday.    Pertinent History  lumbar surgery without placement of instrumentation (possibly disectomy) 02/2016, depression, bilateral knee pain, B12 deficiency, dysmetabolic syndrome, history of bariatric surgery, hypertension    Limitations  Sitting;Other (comment);Standing;Walking;Lifting;House hold activities   traveling in the car   How long can you sit comfortably?  1 hour    How long can you stand comfortably?  about 1 hour (pain starts 2 hours after she stops standing)    How long can you walk comfortably?  30 min    Diagnostic tests  MRI prior to surgery 01/2016: see description in note.    Patient Stated Goals  get stronger, prevent pain from getting worse, and feel better, learn better techniques to replace things that she may be doing wrong.  Pick up 27 year old son.     Currently in Pain?  Yes    Pain Score  1     Pain Location  Buttocks    Pain Orientation  Left    Pain Descriptors / Indicators  Aching    Pain Type  Chronic pain    Pain Onset  More than a month ago         PT Education - 06/28/18 2482  Education Details  exericse/activity purpose and cuing for form    Person(s) Educated  Patient    Methods  Explanation;Demonstration    Comprehension  Verbalized understanding;Returned demonstration        TREATMENT:  Therapeutic exercise:to centralize symptoms and improve ROM and strength required for successful completion of functional activities.  -ambulation/stair climbing between interventions to assess response and guide today's treatment. -Treadmillup to 2.18mph 0%grade For improved lower extremity mobility, muscular endurance, and weightbearing activity tolerance; and to induce the analgesic effect of aerobic exercise, stimulate improved joint nutrition, and prepare body structures and systems for following interventions. x38minutes during subjective exam.  -Standing rows with scapular retraction for improved postural and shoulder girdle,  trunk, and posturalstrengthening and mobility. Required instruction for technique and cuing to retract, posteriorly tilt, and depress scapulae. 2x15 at 20#. (fatigue at 15).  -Standing pallof press (multifidus press) withdouble greentheraband 2x10 each side. Cuing for trunk control and shoulder girdle posture. - standing isometric abdominal bracing with bilateral shoulder extension at cable pull anchored overhead, 1 second, 2 x 10, 15# (fatigues at 10). - standing lateral side glide with 20 degree turn towards wall to achieve side glide plus slight lumbar extension (right hip towards wall), 2x10 (beginning and end of session) - Side stepping with red then green theraband wrapped around legs x 1 and held at umbilicus to activate core and hip stabilizers and abductors in functional weightbearing position. Cuing to activate abdominals. Stepping a few steps out and in as allowed by band 4x10 each side. CGA with gait belt for safety. - Quadrupedl bird dog (alternating shoulder flexion/contralateral hip extension with core muscles braced) , with cuing help keep core stabilized with abdominal brace. 2x10 each way.  Manual therapy:to reduce pain and tissue tension, improve range of motion, neuromodulation, in order to promote improved ability to complete functional activities. - Prone press up with hips off center to right (to close of left), with lateral cliniciain overpressure,2x 10 reps as tolerated (1 sets at start of session and 1 sets at conclusion)  Home Exercise Program: Access Code: [email or web address]  URL: [email or web address]  Date: 06/07/2018  Prepared by: [place] Lumbar Extension Press Up - 10-15 reps - 1 second hold - 4x daily   Standing Lateral Shift correction - pain on left - 10-15 reps - 1 second hold - 4x daily   Seated Lumbar Extension - 10-15 reps - 3 second hold - 4x daily   Seated Correct Posture  Patient response to  treatment:  Pt tolerated treatment well. Pt continues to present overall better. Pt was able to complete all exercises with minimal to no lasting increase in pain or discomfort.She had some discomfort with side stepping that dissapated with rest and was no worse as a result of the activity. Continues to demonstrates good carry over of exercise technique from previous visits.Continued to requredlateral force todayas well as clinician overpressure but with less frequencyPt required cuing for proper technique and to facilitate improved neuromuscular control, strength, range of motion, and functional ability. Pt is making progress towards goalat this point.  PT Short Term Goals - 06/16/18 0940      PT SHORT TERM GOAL #1   Title  Be independent with initial home program for self-management of symptoms.    Baseline  initial HEP provided today    Time  2    Period  Weeks    Status  Achieved    Target Date  06/16/18        PT Long Term Goals - 06/02/18 1047      PT LONG TERM GOAL #1   Title  Be independent with a long-term home exercise program for self-management of symptoms.     Baseline  initial HEP provided at eval    Time  6    Period  Weeks    Status  New    Target Date  07/14/18      PT LONG TERM GOAL #2   Title  Improve FOTO score to equal or greater than 61 to demonstrate improved self-reported function.     Baseline  IE 06/02/18: 44 (adjusted score)    Time  6    Period  Weeks    Status  New    Target Date  07/14/18      PT LONG TERM GOAL #4   Title  Reduce pain associated with functional activities to equal or less than 3/10.     Baseline  Pain that requires medication 2 hours after standing and presenting for 2 hours.     Time  6    Period  Weeks    Status  New    Target Date  07/14/18      PT LONG TERM GOAL #5   Title  Be able to squat to chair height with proper form without limitation due to back pain in order to improve ability to lift and complete transfers  during usual activities.     Baseline  Transfers uncomfortable    Time  6    Period  Weeks    Status  New    Target Date  07/14/18            Plan - 06/28/18 0934    Clinical Impression Statement  Pt tolerated treatment well. Pt continues to present overall better. Pt was able to complete all exercises with minimal to no lasting increase in pain or discomfort.She had some discomfort with side stepping that dissapated with rest and was no worse as a result of the activity. Continues to demonstrates good carry over of exercise technique from previous visits.Continued to requredlateral force todayas well as clinician overpressure but with less frequencyPt required cuing for proper technique and to facilitate improved neuromuscular control, strength, range of motion, and functional ability. Pt is making progress towards goalat this point    Rehab Potential  Good    Clinical Impairments Affecting Rehab Potential  pain, reduced ROM, soft tissue restriction, reduced activitiy tolerance, incomplete knowledge of condition to allow for optimal self management    PT Frequency  2x / week    PT Duration  6 weeks    PT Treatment/Interventions  ADLs/Self Care Home Management;Moist Heat;Gait training;Stair training;Functional mobility training;Therapeutic activities;Therapeutic exercise;Balance training;Neuromuscular re-education;Patient/family education;Manual techniques;Dry needling;Passive range of motion;Spinal Manipulations;Joint Manipulations    PT Next Visit Plan   Cont progressive LE, trunk, and funcitonal strengthening at next visit with specific exercise and manual interventions as needed. Recommendcontinuedprogressing to trunk, LE, and functional strengthening next visit. Attempt dead bird and consider side planks.    PT Home Exercise Plan  Medbridge Code: [email or web address]     Consulted and Agree with Plan of Care  Patient       Patient will benefit from skilled therapeutic intervention in  order to improve the following deficits and impairments:  Decreased balance, Decreased endurance, Decreased mobility, Difficulty walking, Hypomobility, Impaired sensation, Increased muscle spasms, Obesity, Improper body mechanics, Decreased range of motion,  Decreased activity tolerance, Decreased strength, Impaired flexibility, Postural dysfunction, Pain, Other (comment), Dizziness  Visit Diagnosis: Chronic left-sided low back pain with left-sided sciatica  Muscle weakness (generalized)     Problem List Patient Active Problem List   Diagnosis Date Noted  . Tachycardia 05/19/2017  . Palpitations 05/19/2017  . Fibroid uterus 12/20/2015  . History of iron deficiency anemia 10/23/2015  . Left lumbar radiculitis 10/23/2015  . Absence of menstruation 04/26/2015  . B12 deficiency 04/26/2015  . Depression, major, recurrent, mild ([place]) 04/26/2015  . Dyslipidemia 04/26/2015  . Dysmetabolic syndrome 35/45/6256  . Bariatric surgery status 04/26/2015  . H/O: HTN (hypertension) 04/26/2015  . Psoriasis 04/26/2015  . Allergic rhinitis, seasonal 04/26/2015  . Vitamin D deficiency 04/26/2015  . Bilateral knee pain 04/26/2015    Nancy Nordmann, PT, DPT 06/28/2018, 9:54 AM  [facility] [address], [place], [address] Phone: [phone]   Fax:  [phone]  Name: TNIA ANGLADA MRN: [id number] Date of Birth: Aug 17, 1966

## 2018-06-30 ENCOUNTER — Ambulatory Visit: Payer: Managed Care, Other (non HMO) | Admitting: Physical Therapy

## 2018-07-05 ENCOUNTER — Ambulatory Visit: Payer: Managed Care, Other (non HMO) | Admitting: Physical Therapy

## 2018-07-07 ENCOUNTER — Ambulatory Visit: Payer: Managed Care, Other (non HMO) | Admitting: Physical Therapy

## 2018-07-12 ENCOUNTER — Encounter: Payer: Self-pay | Admitting: Physical Therapy

## 2018-07-12 ENCOUNTER — Ambulatory Visit: Payer: Managed Care, Other (non HMO) | Admitting: Physical Therapy

## 2018-07-12 DIAGNOSIS — M5442 Lumbago with sciatica, left side: Secondary | ICD-10-CM | POA: Diagnosis not present

## 2018-07-12 DIAGNOSIS — M6281 Muscle weakness (generalized): Secondary | ICD-10-CM

## 2018-07-12 DIAGNOSIS — G8929 Other chronic pain: Secondary | ICD-10-CM

## 2018-07-12 NOTE — Therapy (Signed)
[facility] [address] [place], [address] Phone: [phone]   Fax:  [phone]  Physical Therapy Treatment  Patient Details  Name: Ashlee Cruz MRN: [id number] Date of Birth: 1966/02/08 Referring Provider (PT): Steele Sizer, MD   Encounter Date: 07/12/2018  PT End of Session - 07/12/18 0905    Visit Number  8    Number of Visits  13    Date for PT Re-Evaluation  07/14/18    Authorization Type  [employer]    Authorization Time Period  Cert period: 26/94/8546-27/10/5007    Authorization - Visit Number  8    Authorization - Number of Visits  10    PT Start Time  0900    PT Stop Time  0945    PT Time Calculation (min)  45 min    Activity Tolerance  Patient tolerated treatment well;Patient limited by pain    Behavior During Therapy  [facility] for tasks assessed/performed       Past Medical History:  Diagnosis Date  . Allergic rhinitis   . Depression   . Hyperlipidemia   . Hypertension   . Metabolic syndrome   . Obesity   . Psoriasis     Past Surgical History:  Procedure Laterality Date  . BARIATRIC SURGERY    . CHOLECYSTECTOMY    . DILATION AND CURETTAGE OF UTERUS    . LUMBAR SPINE SURGERY Left 02/2016   Pt reports lumbar surgery without placement of instrumentation to decompress nerve on left side.     There were no vitals filed for this visit.  Subjective Assessment - 07/12/18 0906    Subjective  Patient reports she missed her last two PT treatments due to a family emergency. She drove 3 hours at a time two ways and did okay using the lumbar roll. Today her pain is 1-2/10 today at mid thigh on the left. She had no excessive soreness or pain after last session.  she has had an extremely stressfull couple of weeks.     Pertinent History  lumbar surgery without placement of instrumentation (possibly disectomy) 02/2016, depression, bilateral knee pain, B12 deficiency, dysmetabolic syndrome, history of  bariatric surgery, hypertension    Limitations  Sitting;Other (comment);Standing;Walking;Lifting;House hold activities   traveling in the car   How long can you sit comfortably?  1 hour    How long can you stand comfortably?  about 1 hour (pain starts 2 hours after she stops standing)    How long can you walk comfortably?  30 min    Diagnostic tests  MRI prior to surgery 01/2016: see description in note.    Patient Stated Goals  get stronger, prevent pain from getting worse, and feel better, learn better techniques to replace things that she may be doing wrong.  Pick up 33 year old son.     Pain Score  2     Pain Location  Other (Comment)   thigh   Pain Orientation  Left    Pain Descriptors / Indicators  Aching    Pain Type  Chronic pain    Pain Onset  More than a month ago         PT Education - 07/12/18 0904    Education Details  exercise/activity purpose and cuing for form, POC     Person(s) Educated  Patient    Methods  Explanation;Demonstration;Tactile cues;Verbal cues    Comprehension  Verbalized understanding;Returned demonstration  TREATMENT:  Therapeutic exercise:to centralize symptoms and improve ROM and strength required for successful completion of functional activities.  -ambulation/stair climbing between interventions to assess response and guide today's treatment. -Treadmillup to 2.20mph 0%grade For improved lower extremity mobility, muscular endurance, and weightbearing activity tolerance; and to induce the analgesic effect of aerobic exercise, stimulate improved joint nutrition, and prepare body structures and systems for following interventions. x66minutes during subjective exam.  -Side glide against wall (right hip towards wall),4x 15 (abolished leg pain, interspersed throughout session).  -Standing rows with scapular retraction for improved postural and shoulder girdle, trunk, and posturalstrengthening and mobility. Required instruction for technique and  cuing to retract, posteriorly tilt, and depress scapulae.2x15 at 20#. (fatigue at 15).  -Standing pallof press (multifidus press) withdouble greentheraband2x15 each side. Cuing for trunk control and shoulder girdle posture.  - standing isometric abdominal bracing with bilateral shoulder extension at cable pull anchored overhead, 1 second, 2 x 11, 15# (fatigues at 11). -Side stepping withredtheraband wrapped around legs x 1and held at umbilicus to activate core and hip stabilizers and abductors in functional weightbearing position. Cuing to activate abdominals. Stepping a few steps out and in as allowed by band 4x10each side. (increased leg pain first set).  - Quadrupedlbird dog (alternating shoulder flexion/contralateral hip extension with core muscles braced) , with cuinghelp keep core stabilized withabdominal brace. 2x10 each way. - hooklying dead bug (alternating hip extension with contralateral shoulder flexion) with feet on mat and physioball suspended above abdomen, 2x10 each side.  - standing lateral side glide with20degree turn towards wall to achieve side glide plus slightlumbar extension (right hip towards wall), x15  Manual therapy:to reduce pain and tissue tension, improve range of motion, neuromodulation, in order to promote improved ability to complete functional activities. - Prone press up with hips off center to right (to close of left), with lateral cliniciain overpressure,2x 10 reps as tolerated (1 sets at start of session and 1 sets at conclusion)  Home Exercise Program: Access Code: [email or web address]  URL: [email or web address]  Date: 06/07/2018  Prepared by: [place] Lumbar Extension Press Up - 10-15 reps - 1 second hold - 4x daily   Standing Lateral Shift correction - pain on left - 10-15 reps - 1 second hold - 4x daily   Seated Lumbar Extension - 10-15 reps - 3 second hold - 4x daily   Seated Correct  Posture  Patient response to treatment:  Pt tolerated treatment well.Pt with slightly more distal pain today but continues to centralize quickly with lateral glide on wall. Pt was able to complete all exercises with minimal to no lasting increase in pain or discomfort. She was limited by arm fatigue and left leg fatigue/pain but improved with rest.She had some discomfort with side stepping that dissapated with rest and was no worse as a result of the activity. Continues to demonstrates good carry over of exercise technique from previous visits.Continued to require and respond well tolateral force today.Pt required cuing for proper technique and to facilitate improved neuromuscular control, strength, range of motion, and functional ability. Pt is making progress towards goalat this point.  PT Short Term Goals - 06/16/18 0940      PT SHORT TERM GOAL #1   Title  Be independent with initial home program for self-management of symptoms.    Baseline  initial HEP provided today    Time  2    Period  Weeks    Status  Achieved  Target Date  06/16/18        PT Long Term Goals - 06/02/18 1047      PT LONG TERM GOAL #1   Title  Be independent with a long-term home exercise program for self-management of symptoms.     Baseline  initial HEP provided at eval    Time  6    Period  Weeks    Status  New    Target Date  07/14/18      PT LONG TERM GOAL #2   Title  Improve FOTO score to equal or greater than 61 to demonstrate improved self-reported function.     Baseline  IE 06/02/18: 44 (adjusted score)    Time  6    Period  Weeks    Status  New    Target Date  07/14/18      PT LONG TERM GOAL #4   Title  Reduce pain associated with functional activities to equal or less than 3/10.     Baseline  Pain that requires medication 2 hours after standing and presenting for 2 hours.     Time  6    Period  Weeks    Status  New    Target Date  07/14/18      PT LONG TERM GOAL #5   Title  Be  able to squat to chair height with proper form without limitation due to back pain in order to improve ability to lift and complete transfers during usual activities.     Baseline  Transfers uncomfortable    Time  6    Period  Weeks    Status  New    Target Date  07/14/18            Plan - 07/12/18 1035    Clinical Impression Statement  Pt tolerated treatment well.Pt with slightly more distal pain today but continues to centralize quickly with lateral glide on wall. Pt was able to complete all exercises with minimal to no lasting increase in pain or discomfort. She was limited by arm fatigue and left leg fatigue/pain but improved with rest.She had some discomfort with side stepping that dissapated with rest and was no worse as a result of the activity. Continues to demonstrates good carry over of exercise technique from previous visits.Continued to require and respond well tolateral force today.Pt required cuing for proper technique and to facilitate improved neuromuscular control, strength, range of motion, and functional ability. Pt is making progress towards goalat this point.    Rehab Potential  Good    Clinical Impairments Affecting Rehab Potential  pain, reduced ROM, soft tissue restriction, reduced activitiy tolerance, incomplete knowledge of condition to allow for optimal self management    PT Frequency  2x / week    PT Duration  6 weeks    PT Treatment/Interventions  ADLs/Self Care Home Management;Moist Heat;Gait training;Stair training;Functional mobility training;Therapeutic activities;Therapeutic exercise;Balance training;Neuromuscular re-education;Patient/family education;Manual techniques;Dry needling;Passive range of motion;Spinal Manipulations;Joint Manipulations    PT Next Visit Plan   Cont progressive LE, trunk, and funcitonal strengthening at next visit with specific exercise and manual interventions as needed. Recommendcontinuedprogressing to trunk, LE, and  functional strengthening next visit. Attempt dead bird and consider side planks.    PT Home Exercise Plan  Medbridge Code: [email or web address]     Consulted and Agree with Plan of Care  Patient       Patient will benefit from skilled therapeutic intervention in order to improve the following deficits and impairments:  Decreased balance, Decreased endurance, Decreased mobility, Difficulty walking, Hypomobility, Impaired sensation, Increased muscle spasms, Obesity, Improper body mechanics, Decreased range of motion, Decreased activity tolerance, Decreased strength, Impaired flexibility, Postural dysfunction, Pain, Other (comment), Dizziness  Visit Diagnosis: Chronic left-sided low back pain with left-sided sciatica  Muscle weakness (generalized)     Problem List Patient Active Problem List   Diagnosis Date Noted  . Tachycardia 05/19/2017  . Palpitations 05/19/2017  . Fibroid uterus 12/20/2015  . History of iron deficiency anemia 10/23/2015  . Left lumbar radiculitis 10/23/2015  . Absence of menstruation 04/26/2015  . B12 deficiency 04/26/2015  . Depression, major, recurrent, mild ([place]) 04/26/2015  . Dyslipidemia 04/26/2015  . Dysmetabolic syndrome 15/94/5859  . Bariatric surgery status 04/26/2015  . H/O: HTN (hypertension) 04/26/2015  . Psoriasis 04/26/2015  . Allergic rhinitis, seasonal 04/26/2015  . Vitamin D deficiency 04/26/2015  . Bilateral knee pain 04/26/2015    Nancy Nordmann, PT, DPT 07/12/2018, 10:35 AM  [facility] [address], [place], [address] Phone: [phone]   Fax:  [phone]  Name: Ashlee Cruz MRN: [id number] Date of Birth: Sep 01, 1965

## 2018-07-14 ENCOUNTER — Encounter: Payer: Self-pay | Admitting: Physical Therapy

## 2018-07-14 ENCOUNTER — Ambulatory Visit: Payer: Managed Care, Other (non HMO) | Admitting: Physical Therapy

## 2018-07-14 DIAGNOSIS — M6281 Muscle weakness (generalized): Secondary | ICD-10-CM

## 2018-07-14 DIAGNOSIS — M5442 Lumbago with sciatica, left side: Secondary | ICD-10-CM | POA: Diagnosis not present

## 2018-07-14 DIAGNOSIS — G8929 Other chronic pain: Secondary | ICD-10-CM

## 2018-07-14 NOTE — Therapy (Signed)
[facility] [address] [place], [address] Phone: [phone]   Fax:  [phone]  Physical Therapy Treatment/Discharge Summary Reporting Period: 06/02/2018 -07/14/2018  Patient Details  Name: Ashlee Cruz MRN: [id number] Date of Birth: 02/04/1966 Referring Provider (PT): Steele Sizer, MD   Encounter Date: 07/14/2018  PT End of Session - 07/14/18 0907    Visit Number  9    Number of Visits  13    Date for PT Re-Evaluation  07/14/18    Authorization Type  [employer]    Authorization Time Period  Cert period: 19/37/9024-09/73/5329    Authorization - Visit Number  1    Authorization - Number of Visits  10    PT Start Time  0901    PT Stop Time  0946    PT Time Calculation (min)  45 min    Activity Tolerance  Patient tolerated treatment well    Behavior During Therapy  [facility] for tasks assessed/performed       Past Medical History:  Diagnosis Date  . Allergic rhinitis   . Depression   . Hyperlipidemia   . Hypertension   . Metabolic syndrome   . Obesity   . Psoriasis     Past Surgical History:  Procedure Laterality Date  . BARIATRIC SURGERY    . CHOLECYSTECTOMY    . DILATION AND CURETTAGE OF UTERUS    . LUMBAR SPINE SURGERY Left 02/2016   Pt reports lumbar surgery without placement of instrumentation to decompress nerve on left side.     There were no vitals filed for this visit.  Subjective Assessment - 07/14/18 1122    Subjective  Patient reports no pain today upon arrival. She states she feels like she has benefitted greatly from PT and has learned a lot of self-management tools and techniques to be able to continue managing her condition independently. She is somewhat interested in starting a long term fitness program and feels ready to discharge from skilled physical therapy today.      Pertinent History  lumbar surgery without placement of instrumentation (possibly disectomy) 02/2016,  depression, bilateral knee pain, B12 deficiency, dysmetabolic syndrome, history of bariatric surgery, hypertension    Limitations  Sitting;Other (comment);Standing;Walking;Lifting;House hold activities   traveling in the car   How long can you stand comfortably?  2 hours    Diagnostic tests  MRI prior to surgery 01/2016: see description in note.    Patient Stated Goals  get stronger, prevent pain from getting worse, and feel better, learn better techniques to replace things that she may be doing wrong.  Pick up 36 year old son.     Currently in Pain?  No/denies    Pain Onset  More than a month ago        PT Education - 07/14/18 0906    Education Details  exercise/activity purpose, cuing for form, POC and progress, HEP update and instructions for discharge.     Person(s) Educated  Patient    Methods  Explanation;Demonstration;Tactile cues;Verbal cues;Handout    Comprehension  Verbalized understanding;Returned demonstration      OBJECTIVE:  FUNCTIONAL TESTING:  - squat: able to squat to chair height with good form and no pain in lumbar spine x 10.   TREATMENT:  Therapeutic exercise:to centralize symptoms and improve ROM and strength required for successful completion of functional activities.  -Treadmillup to 2.102mph 0%grade For improved lower extremity mobility, muscular endurance, and weightbearing activity tolerance; and to  induce the analgesic effect of aerobic exercise, stimulate improved joint nutrition, and prepare body structures and systems for following interventions. x25minutes during subjective exam.  -Side glide against wall (right hip towards wall), 2x 15 (as a prophylactic measure before and after squatting).  -Standing rows with scapular retraction for improved postural and shoulder girdle, trunk, and posturalstrengthening and mobility. Required instruction for technique and cuing to retract, posteriorly tilt, and depress scapulae.2x15with black theraband. (fatigue at  15).  -Standing pallof press (multifidus press) withsingle black therabandx15each side. Min cuing for trunk control and shoulder girdle posture.  -Side stepping withredtheraband wrapped around ankles x 2 and touchdown UE support available to practice how to complete it at home. to activate core and hip stabilizers and abductors in functional weightbearing position. Min cuing to activate abdominals. Stepping a few steps out and in as allowed by bandto fatigue - hooklying dead bug (alternating hip extension with contralateral shoulder flexion) with feet on mat, x10 each side.  - standing lateral side glide with20degree turn towards wall to achieve side glide plus slightlumbar extension (right hip towards wall), x15 - Squats to buttocks tap on 18 " chair, focusing on glute activation and proper form with hip hinging, stabilized back, and tibial perpendicular to floor with knees behind toes. X 10  - Squats with BUE support focusing on glute activation and proper form with hip hinging, stabilized back, and tibial perpendicular to floor with knees behind toes. X 10 - hooklying bridges 2x15 to encourage hip extension and glute activation during squats.  - Education on HEP including handout and green, red, and black theraband.   Home Exercise Program: Access Code: [email or web address]  URL: [email or web address]  Date: 07/14/2018  Prepared by: [place] Lumbar Extension Press Up - 10-15 reps - 1 second hold - 4x daily  Standing Lateral Shift correction - pain on left - 10-15 reps - 1 second hold - 4x daily  Seated Lumbar Extension - 10-15 reps - 3 second hold - 4x daily  Seated Correct Posture  Squatting Anti-Rotation Press - 10-15 reps - 1 second hold - 3 Sets - 1x daily - 3x weekly  Split Stance Shoulder Row with Resistance - 10-15 reps - 1 second hold - 3 Sets - 1x daily - 3x weekly  Side Stepping with Resistance at Ankles and Counter Support - 10-15  reps - 1 second hold - 3 Sets - 1x daily - 3x weekly  Bird Dog - 10-15 reps - 1 second hold - 3 Sets - 1x daily - 3x weekly  Supine Dead Bug with Leg Extension - 10-15 reps - 1 second hold - 3 Sets - 1x daily - 3x weekly  Supine Bridge with Resistance Band - 10-15 reps - 1 second hold - 3 Sets - 1x daily - 3x weekly  Squat with Chair Touch and Resistance Loop - 10-15 reps - 1 second hold - 3 Sets - 1x daily - 3x weekly   Patient response to treatment:  Pt tolerated treatment well. Patient with no increased leg pain or low back pain throughout session. Demonstrated good form with minor cuing today. Able to comprehend additions to HEP instructions.Continues to demonstrates good carry over of exercise technique from previous visits.Continued to require and respond well tolateral force today.Pt required min cuing for proper technique and to facilitate improved neuromuscular control, strength, range of motion, and functional ability. Pt is making progress towards goalat this point.  PT Short Term Goals -  06/16/18 0940      PT SHORT TERM GOAL #1   Title  Be independent with initial home program for self-management of symptoms.    Baseline  initial HEP provided today    Time  2    Period  Weeks    Status  Achieved    Target Date  06/16/18        PT Long Term Goals - 07/14/18 0907      PT LONG TERM GOAL #1   Title  Be independent with a long-term home exercise program for self-management of symptoms.     Baseline  initial HEP provided at eval    Time  6    Period  Weeks    Status  Achieved    Target Date  07/14/18      PT LONG TERM GOAL #2   Title  Improve FOTO score to equal or greater than 61 to demonstrate improved self-reported function.     Baseline  IE 06/02/18: 44 (adjusted score); 70 (07/14/2018).     Time  6    Period  Weeks    Status  Achieved    Target Date  07/14/18      PT LONG TERM GOAL #4   Title  Reduce pain associated with functional activities to equal or less  than 3/10.     Baseline  Pain that requires medication 2 hours after standing and presenting for 2 hours (initial eval); 2/10 does not require medicatoin (07/14/2018).    Time  6    Period  Weeks    Status  Achieved    Target Date  07/14/18      PT LONG TERM GOAL #5   Title  Be able to squat to chair height with proper form without limitation due to back pain in order to improve ability to lift and complete transfers during usual activities.     Baseline  Transfers uncomfortable at initial eval, now able to squat to tap buttocks on chair with good form and no discomfort.     Time  6    Period  Weeks    Status  New    Target Date  07/14/18            Plan - 07/14/18 1133    Clinical Impression Statement  Patient has attended 9 physical therapy treatment sessions and has achieved all short and long term goals at this point. Subjectively she reports significant improvement in her pain and functional ability. Objectively she demonstrates improved movement patterns, functional activity tolerance, and functional strength. Patient appears ready for discharge from physical therapy to an independent HEP for continued self-management of her condition. Pt was encouraged to start a generalized fitness program and provided resources to do so. Patient is now being discharged from skilled physical therapy.     Rehab Potential  Good    Clinical Impairments Affecting Rehab Potential  pain, reduced ROM, soft tissue restriction, reduced activitiy tolerance, incomplete knowledge of condition to allow for optimal self management    PT Frequency  2x / week    PT Duration  6 weeks    PT Treatment/Interventions  ADLs/Self Care Home Management;Moist Heat;Gait training;Stair training;Functional mobility training;Therapeutic activities;Therapeutic exercise;Balance training;Neuromuscular re-education;Patient/family education;Manual techniques;Dry needling;Passive range of motion;Spinal Manipulations;Joint  Manipulations    PT Next Visit Plan  Patient is now discharged from skilled physical therapy.     PT Home Exercise Plan  Medbridge Code: [email or web address]     Consulted and Agree  with Plan of Care  Patient       Patient will benefit from skilled therapeutic intervention in order to improve the following deficits and impairments:  Decreased balance, Decreased endurance, Decreased mobility, Difficulty walking, Hypomobility, Impaired sensation, Increased muscle spasms, Obesity, Improper body mechanics, Decreased range of motion, Decreased activity tolerance, Decreased strength, Impaired flexibility, Postural dysfunction, Pain, Other (comment), Dizziness  Visit Diagnosis: Chronic left-sided low back pain with left-sided sciatica  Muscle weakness (generalized)     Problem List Patient Active Problem List   Diagnosis Date Noted  . Tachycardia 05/19/2017  . Palpitations 05/19/2017  . Fibroid uterus 12/20/2015  . History of iron deficiency anemia 10/23/2015  . Left lumbar radiculitis 10/23/2015  . Absence of menstruation 04/26/2015  . B12 deficiency 04/26/2015  . Depression, major, recurrent, mild ([facility]) 04/26/2015  . Dyslipidemia 04/26/2015  . Dysmetabolic syndrome 51/89/8421  . Bariatric surgery status 04/26/2015  . H/O: HTN (hypertension) 04/26/2015  . Psoriasis 04/26/2015  . Allergic rhinitis, seasonal 04/26/2015  . Vitamin D deficiency 04/26/2015  . Bilateral knee pain 04/26/2015    Nancy Nordmann, PT, DPT 07/14/2018, 11:33 AM  [facility] [address] [place], [address] Phone: [phone]   Fax:  [phone]  Name: GERALDENE EISEL MRN: [id number] Date of Birth: [birth year]

## 2018-08-31 ENCOUNTER — Other Ambulatory Visit: Payer: Self-pay | Admitting: Family Medicine

## 2018-08-31 DIAGNOSIS — M5416 Radiculopathy, lumbar region: Secondary | ICD-10-CM

## 2018-09-01 MED ORDER — CYCLOBENZAPRINE HCL 10 MG PO TABS: ORAL_TABLET | ORAL | 0 refills | Status: DC

## 2018-09-01 NOTE — Telephone Encounter (Signed)
Refill request for general medication: Flexeril 10 mg  Last office visit: 05/12/2018  Last physical exam: None indicated  Follow-ups on file. 11/10/2018

## 2018-10-26 ENCOUNTER — Other Ambulatory Visit: Payer: Self-pay | Admitting: Family Medicine

## 2018-10-26 DIAGNOSIS — M5416 Radiculopathy, lumbar region: Secondary | ICD-10-CM

## 2018-10-26 DIAGNOSIS — F330 Major depressive disorder, recurrent, mild: Secondary | ICD-10-CM

## 2018-11-10 ENCOUNTER — Encounter: Payer: Managed Care, Other (non HMO) | Admitting: Family Medicine

## 2019-01-11 ENCOUNTER — Encounter: Payer: Self-pay | Admitting: Family Medicine

## 2019-01-11 ENCOUNTER — Ambulatory Visit (INDEPENDENT_AMBULATORY_CARE_PROVIDER_SITE_OTHER): Payer: Managed Care, Other (non HMO) | Admitting: Family Medicine

## 2019-01-11 ENCOUNTER — Other Ambulatory Visit: Payer: Self-pay

## 2019-01-11 DIAGNOSIS — E785 Hyperlipidemia, unspecified: Secondary | ICD-10-CM

## 2019-01-11 DIAGNOSIS — E8881 Metabolic syndrome: Secondary | ICD-10-CM

## 2019-01-11 DIAGNOSIS — Z9884 Bariatric surgery status: Secondary | ICD-10-CM | POA: Diagnosis not present

## 2019-01-11 DIAGNOSIS — Z8639 Personal history of other endocrine, nutritional and metabolic disease: Secondary | ICD-10-CM | POA: Insufficient documentation

## 2019-01-11 DIAGNOSIS — Z1211 Encounter for screening for malignant neoplasm of colon: Secondary | ICD-10-CM

## 2019-01-11 DIAGNOSIS — F330 Major depressive disorder, recurrent, mild: Secondary | ICD-10-CM

## 2019-01-11 DIAGNOSIS — M5416 Radiculopathy, lumbar region: Secondary | ICD-10-CM

## 2019-01-11 DIAGNOSIS — E538 Deficiency of other specified B group vitamins: Secondary | ICD-10-CM

## 2019-01-11 DIAGNOSIS — E559 Vitamin D deficiency, unspecified: Secondary | ICD-10-CM

## 2019-01-11 DIAGNOSIS — Z1212 Encounter for screening for malignant neoplasm of rectum: Secondary | ICD-10-CM

## 2019-01-11 DIAGNOSIS — H6981 Other specified disorders of Eustachian tube, right ear: Secondary | ICD-10-CM

## 2019-01-11 MED ORDER — FLUTICASONE PROPIONATE 50 MCG/ACT NA SUSP: 2.0000 | Freq: Every day | NASAL | 6 refills | Status: DC

## 2019-01-11 MED ORDER — CYCLOBENZAPRINE HCL 10 MG PO TABS: ORAL_TABLET | ORAL | 0 refills | Status: DC

## 2019-01-11 MED ORDER — GABAPENTIN 300 MG PO CAPS: ORAL_CAPSULE | ORAL | 1 refills | Status: DC

## 2019-01-11 NOTE — Progress Notes (Signed)
Name: Ashlee Cruz   MRN: [id number]    DOB: 04/08/1966   Date:01/11/2019       Progress Note  Subjective  Chief Complaint  Chief Complaint  Patient presents with   Muscle Pain    medication refills, pain radiates down left leg   Allergic Rhinitis     I connected with  Bobette Mo  on 01/11/19 at  8:40 AM EDT by a video enabled telemedicine application and verified that I am speaking with the correct person using two identifiers.  I discussed the limitations of evaluation and management by telemedicine and the availability of in person appointments. The patient expressed understanding and agreed to proceed. Staff also discussed with the patient that there may be a patient responsible charge related to this service. Patient Location: Home Provider Location: Office Additional Individuals present: None  HPI  Left lower back pain: she had back surgery done by Dr. Consuella Lose in July 2017 and was doing great, but a few months after the surgery she had a flare. She was seen by Dr. Kathyrn Sheriff and had steroid injection end of November 2017and symptoms improved a little, she had another injection January 2018 .Back pain is chronic and aching  3/10but at times it burning that goes down to lateral thigh and sometimes down to her left foot, a little worse over the past month but not as severe as her last  flare back 03/2017 but did not need an injection at that time, taking Flexeriland still has medication at home, she states cymbalta helps with pain level. She did try PT after her last visit in September 2019 and this did help to decrease the frequency of the spasms. Also taking gabapentin QHS and cymbalta daily. Discussed referral to pain management - declines for now.  Major Depression Mild Recurrent:she is now on Cymbalta to help with pain and mood. 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  Phq 9 reviewed and is negative today.   Office Visit from 01/11/2019 in [facility]  PHQ-9 Total Score  0     Bariatric Surgery: she stopped taking all supplementsagain, explained importance of compliance with supplements, she has a history of B12, D and iron deficiency anemia.Sheis getting B12 injectionsmonthly.  Not taking iron or vitamin D supplements. Sheis back on Metformin now. Trying to eat healthier, she will try to exercise more She is now overweight.  Metabolic Syndrome: last [id number] work was 5.7% , she has not been weighing herself recently - but knows she has been more sedentary lately. She denies polyphagia, polydipsia or polyuria.  Tachycardia: she used to take beta-blocker, but bp dropped (was also just after bariatric surgery) so she has been off for years.She was seen by Dr. Saunders Revel and was advised to decrease caffeine intake and exercise more, echo was normal. She states heart rate at home is around 110.She feels palpitations more frequently when she is anxious. No changes.   LEFT Ear Fullness: Feels stopped up, sometimes causes some dizziness with changes in position.  She gets periodic headaches, but not related to this.  No tinnitus. Discussed home epley maneuvers.  Patient Active Problem List   Diagnosis Date Noted   Tachycardia 05/19/2017   Palpitations 05/19/2017   Fibroid uterus 12/20/2015   History of iron deficiency anemia 10/23/2015   Left lumbar radiculitis  10/23/2015   Absence of menstruation 04/26/2015   B12 deficiency 04/26/2015   Depression, major, recurrent, mild ([place]) 04/26/2015   Dyslipidemia 12/75/1700   Dysmetabolic syndrome 17/49/4496   Bariatric surgery status 04/26/2015   H/O: HTN (hypertension) 04/26/2015   Psoriasis 04/26/2015   Allergic rhinitis, seasonal 04/26/2015   Vitamin D deficiency 04/26/2015   Bilateral knee pain 04/26/2015    Past Surgical History:    Procedure Laterality Date   BARIATRIC SURGERY     CHOLECYSTECTOMY     DILATION AND CURETTAGE OF UTERUS     LUMBAR SPINE SURGERY Left 02/2016   Pt reports lumbar surgery without placement of instrumentation to decompress nerve on left side.     Family History  Problem Relation Age of Onset   Diabetes Mother    Skin cancer Mother    Diabetes Father    COPD Father    Seizures Father    Heart disease Father 60       CABG   Diabetes Brother    Multiple sclerosis Brother    Stroke Paternal Grandmother    Eczema Son     Social History   Socioeconomic History   Marital status: Married    Spouse name: Lennette Bihari   Number of children: 1   Years of education: Not on file   Highest education level: Associate degree: occupational, Hotel manager, or vocational program  Occupational History   Not on file  Social Needs   Financial resource strain: Not hard at all   Food insecurity:    Worry: Never true    Inability: Never true   Transportation needs:    Medical: No    Non-medical: No  Tobacco Use   Smoking status: Never Smoker   Smokeless tobacco: Never Used  Substance and Sexual Activity   Alcohol use: No    Alcohol/week: 0.0 standard drinks   Drug use: No   Sexual activity: Yes    Partners: Male    Birth control/protection: None  Lifestyle   Physical activity:    Days per week: 7 days    Minutes per session: 30 min   Stress: Not at all  Relationships   Social connections:    Talks on phone: More than three times a week    Gets together: More than three times a week    Attends religious service: More than 4 times per year    Active member of club or organization: Yes    Attends meetings of clubs or organizations: More than 4 times per year    Relationship status: Married   Intimate partner violence:    Fear of current or ex partner: No    Emotionally abused: No    Physically abused: No    Forced sexual activity: No  Other Topics Concern    Not on file  Social History Narrative   Not on file     Current Outpatient Medications:    acetaminophen (TYLENOL) 500 MG tablet, Take 1,000 mg by mouth as needed for moderate pain., Disp: , Rfl:    Apremilast (OTEZLA) 30 MG TABS, Take 1 tablet by mouth 2 (two) times daily. For psoriasis, Disp: , Rfl:    cyclobenzaprine (FLEXERIL) 10 MG tablet, TAKE 1 TABLET BY MOUTH 3  TIMES DAILY AS NEEDED FOR  MUSCLE SPASM(S), Disp: 90 tablet, Rfl: 0   DULoxetine (CYMBALTA) 60 MG capsule, TAKE 1 CAPSULE(60 MG) BY MOUTH DAILY, Disp: 90 capsule, Rfl: 0   gabapentin (NEURONTIN) 300 MG capsule, TAKE 1 CAPSULE(300  MG) BY MOUTH AT BEDTIME, Disp: 90 capsule, Rfl: 1   metFORMIN (GLUCOPHAGE-XR) 500 MG 24 hr tablet, TAKE 1 TABLET BY MOUTH DAILY WITH BREAKFAST, Disp: 90 tablet, Rfl: 1   Multiple Vitamin (MULTIVITAMIN) tablet, , Disp: , Rfl:    triamcinolone cream (KENALOG) 0.1 %, , Disp: , Rfl: 2   fluticasone (FLONASE) 50 MCG/ACT nasal spray, Place 2 sprays into both nostrils daily. (Patient not taking: Reported on 05/12/2018), Disp: 16 g, Rfl: 0   omeprazole (PRILOSEC) 40 MG capsule, Take 1 capsule (40 mg total) by mouth daily. (Patient not taking: Reported on 05/12/2018), Disp: 30 capsule, Rfl: 0  Current Facility-Administered Medications:    cyanocobalamin ((VITAMIN B-12)) injection 1,000 mcg, 1,000 mcg, Subcutaneous, Q30 days, Steele Sizer, MD, 1,000 mcg at 11/09/17 0258  Allergies  Allergen Reactions   Other Other (See Comments)    Any fragrant smell sets her off   Codeine    Latex     Pt reports prolonged exposure causes aggravation in psoriasis   Bacitracin-Polymyxin B Rash    redness if left on for long periods   states no to latex   use paper tape please.  06/02/2018: pt reports she does not remember having a reaction to this medication    I personally reviewed active problem list, medication list, allergies, notes from last encounter, lab results with the patient/caregiver  today.   ROS Constitutional: Negative for fever or weight change.  Respiratory: Negative for cough and shortness of breath.   Cardiovascular: Negative for chest pain or palpitations.  Gastrointestinal: Negative for abdominal pain, no bowel changes.  Musculoskeletal: Negative for gait problem or joint swelling.  Skin: Negative for rash.  Neurological: Negative for dizziness or headache.  No other specific complaints in a complete review of systems (except as listed in HPI above).  Objective  Virtual encounter, vitals not obtained.  There is no height or weight on file to calculate BMI.  Physical Exam Constitutional: Patient appears well-developed and well-nourished. No distress.  HENT: Head: Normocephalic and atraumatic.  Neck: Normal range of motion. Pulmonary/Chest: Effort normal. No respiratory distress. Speaking in complete sentences Neurological: Pt is alert and oriented to person, place, and time. Coordination, speech and gait are normal.  Psychiatric: Patient has a normal mood and affect. behavior is normal. Judgment and thought content normal.  No results found for this or any previous visit (from the past 72 hour(s)).  PHQ2/9: Depression screen [facility] 2/9 01/11/2019 05/12/2018 05/12/2018 11/09/2017 06/30/2017  Decreased Interest 0 0 0 0 0  Down, Depressed, Hopeless 0 0 0 0 0  PHQ - 2 Score 0 0 0 0 0  Altered sleeping 0 1 - 1 1  Tired, decreased energy 0 1 - 1 1  Change in appetite 0 0 - 1 0  Feeling bad or failure about yourself  0 0 - 0 1  Trouble concentrating 0 0 - 0 0  Moving slowly or fidgety/restless 0 0 - 0 0  Suicidal thoughts 0 0 - 0 0  PHQ-9 Score 0 2 - 3 3  Difficult doing work/chores Not difficult at all Not difficult at all - Not difficult at all Not difficult at all   PHQ-2/9 Result is negative.    Fall Risk: Fall Risk  01/11/2019 05/12/2018 11/09/2017 06/30/2017 03/26/2017  Falls in the past year? 0 No No No Yes  Comment - - - - -  Number falls in past yr:  0 - - - 1  Injury with Fall? 0 - - -  Yes  Comment - - - - -  Follow up Falls evaluation completed - - - -    Assessment & Plan  1. Left lumbar radiculitis - Stable at this time, no changes wanted per patient, will consider referral to pain management if she changes her mind.  - cyclobenzaprine (FLEXERIL) 10 MG tablet; TAKE 1 TABLET BY MOUTH 3  TIMES DAILY AS NEEDED FOR  MUSCLE SPASM(S)  Dispense: 90 tablet; Refill: 0 - gabapentin (NEURONTIN) 300 MG capsule; TAKE 1 CAPSULE(300 MG) BY MOUTH AT BEDTIME  Dispense: 90 capsule; Refill: 1  2. Depression, major, recurrent, mild ([facility]) - Stable  3. Bariatric surgery status - Stable, needs to increase exercise and eat healthier.   4. B12 deficiency - Due for labs, taking sublingual.  5. Vitamin D deficiency - Due for labs, not taking supplement  6. History of iron deficiency - Due for labs, taking multivitamin with iron.  7. Dyslipidemia - Lipid panel  8. Dysmetabolic syndrome - Hemoglobin A1c - Lipid panel  9. Dysfunction of right eustachian tube - fluticasone (FLONASE) 50 MCG/ACT nasal spray; Place 2 sprays into both nostrils daily.  Dispense: 16 g; Refill: 6  11. Screening for colorectal cancer - Fecal occult blood, imunochemical  I discussed the assessment and treatment plan with the patient. The patient was provided an opportunity to ask questions and all were answered. The patient agreed with the plan and demonstrated an understanding of the instructions.  The patient was advised to call back or seek an in-person evaluation if the symptoms worsen or if the condition fails to improve as anticipated.  I provided 26 minutes of non-face-to-face time during this encounter.

## 2019-01-11 NOTE — Patient Instructions (Signed)
[email or web address]

## 2019-01-27 ENCOUNTER — Encounter: Payer: Self-pay | Admitting: Family Medicine

## 2019-01-29 LAB — LIPID PANEL
Chol/HDL Ratio: 2.4 ratio (ref 0.0–4.4)
Cholesterol, Total: 234 mg/dL — ABNORMAL HIGH (ref 100–199)
HDL: 96 mg/dL (ref >39)
LDL Calculated: 122 mg/dL — ABNORMAL HIGH (ref 0–99)
Triglycerides: 78 mg/dL (ref 0–149)
VLDL Cholesterol Cal: 16 mg/dL (ref 5–40)

## 2019-01-29 LAB — HEMOGLOBIN A1C
Est. average glucose Bld gHb Est-mCnc: 123 mg/dL
Hgb A1c MFr Bld: 5.9 % — ABNORMAL HIGH (ref 4.8–5.6)

## 2019-01-31 ENCOUNTER — Ambulatory Visit: Payer: Managed Care, Other (non HMO) | Admitting: Family Medicine

## 2019-01-31 ENCOUNTER — Other Ambulatory Visit: Payer: Self-pay

## 2019-01-31 ENCOUNTER — Encounter: Payer: Self-pay | Admitting: Family Medicine

## 2019-01-31 VITALS — BP 122/76 | HR 110 | Temp 98.0°F | Resp 16 | Ht 64.0 in | Wt 178.0 lb

## 2019-01-31 DIAGNOSIS — Z1211 Encounter for screening for malignant neoplasm of colon: Secondary | ICD-10-CM | POA: Diagnosis not present

## 2019-01-31 DIAGNOSIS — Z1239 Encounter for other screening for malignant neoplasm of breast: Secondary | ICD-10-CM | POA: Diagnosis not present

## 2019-01-31 DIAGNOSIS — Z124 Encounter for screening for malignant neoplasm of cervix: Secondary | ICD-10-CM | POA: Diagnosis not present

## 2019-01-31 DIAGNOSIS — R7303 Prediabetes: Secondary | ICD-10-CM

## 2019-01-31 DIAGNOSIS — Z01419 Encounter for gynecological examination (general) (routine) without abnormal findings: Secondary | ICD-10-CM | POA: Diagnosis not present

## 2019-01-31 DIAGNOSIS — H8112 Benign paroxysmal vertigo, left ear: Secondary | ICD-10-CM

## 2019-01-31 NOTE — Progress Notes (Signed)
Name: Ashlee Cruz   MRN: [id number]    DOB: 27-Nov-1965   Date:01/31/2019       Progress Note  Subjective  Chief Complaint  Chief Complaint  Patient presents with  . Annual Exam    HPI   Patient presents for annual CPE and dizziness  BPPV: she states she has noticed dizziness when moving her head quickly, also when leaning backwards on her recliner, she tried Epley maneuver but felt nauseated when leaning left, also feels left ear is clogged , no hearing loss, no tinnitus. She has tried nasal spray without help. We will make referral to ENT today   Pre-diabetes: last A1C was 5.9%, discussed resuming a healthier diet   Diet: stress eating since COVID-19, she will try to replace eating with an activity.  USPSTF grade A and B recommendations    Office Visit from 01/31/2019 in [facility]  AUDIT-C Score  0     Depression: Phq 9 is  positive Depression screen [facility] 2/9 01/31/2019 01/11/2019 05/12/2018 05/12/2018 11/09/2017  Decreased Interest 0 0 0 0 0  Down, Depressed, Hopeless 1 0 0 0 0  PHQ - 2 Score 1 0 0 0 0  Altered sleeping 1 0 1 - 1  Tired, decreased energy 0 0 1 - 1  Change in appetite 1 0 0 - 1  Feeling bad or failure about yourself  0 0 0 - 0  Trouble concentrating 0 0 0 - 0  Moving slowly or fidgety/restless 0 0 0 - 0  Suicidal thoughts 0 0 0 - 0  PHQ-9 Score 3 0 2 - 3  Difficult doing work/chores Not difficult at all Not difficult at all Not difficult at all - Not difficult at all   Hypertension: BP Readings from Last 3 Encounters:  01/31/19 122/76  05/12/18 108/72  11/09/17 106/62   Obesity: Wt Readings from Last 3 Encounters:  01/31/19 178 lb (80.7 kg)  05/12/18 173 lb 4.8 oz (78.6 kg)  11/09/17 178 lb 9.6 oz (81 kg)   BMI Readings from Last 3 Encounters:  01/31/19 30.55 kg/m  05/12/18 29.75 kg/m  11/09/17 30.66 kg/m    Hep C Screening: today  STD testing and prevention (HIV/chl/gon/syphilis): N/A Intimate partner  violence:negative screen  Sexual History/Pain during Intercourse: no pain during intercourse  Menstrual History/LMP/Abnormal Bleeding: post-menopausal Incontinence Symptoms: none   Advanced Care Planning: A voluntary discussion about advance care planning including the explanation and discussion of advance directives.  Discussed health care proxy and Living will, and the patient was able to identify a health care proxy as husband .  Patient does not have a living will at present time.   Breast cancer:  [facility] Mammogram  Date Value Ref Range Status  02/10/2017 Self Reported Normal 0-4 Bi-Rad, Self Reported Normal Final    Comment:    [facility], Negative, 1 year follow up Dr. Faythe Ghee, DO    Cervical cancer screening: today   Osteoporosis Screening: discussed high calcium and vitamin D diet   Lipids:  Lab Results  Component Value Date   CHOL 234 (H) 01/28/2019   CHOL 244 (A) 04/06/2018   CHOL 242 (H) 03/27/2017   Lab Results  Component Value Date   HDL 96 01/28/2019   HDL 88 (A) 04/06/2018   HDL 94 03/27/2017   Lab Results  Component Value Date   LDLCALC 122 (H) 01/28/2019   LDLCALC 140 04/06/2018   LDLCALC 136 (H) 03/27/2017   Lab Results  Component Value Date   TRIG 78 01/28/2019   TRIG 81 04/06/2018   TRIG 60 03/27/2017   Lab Results  Component Value Date   CHOLHDL 2.4 01/28/2019   CHOLHDL 2.6 03/27/2017   CHOLHDL 2.3 08/20/2016   No results found for: LDLDIRECT  Glucose:  Glucose  Date Value Ref Range Status  03/27/2017 92 65 - 99 mg/dL Final  08/20/2016 85 65 - 99 mg/dL Final  10/24/2015 90 65 - 99 mg/dL Final  10/29/2011 121 (H) 65 - 99 mg/dL Final    Skin cancer: sees Dermatologist  Colorectal cancer: she will do FIT test Lung cancer:   Low Dose CT Chest recommended if Age 25-80 years, 30 pack-year currently smoking OR have quit w/in 15years. Patient does not qualify.   ECG:08/2017  Patient Active Problem List   Diagnosis Date Noted  . History of  iron deficiency 01/11/2019  . Tachycardia 05/19/2017  . Palpitations 05/19/2017  . Fibroid uterus 12/20/2015  . History of iron deficiency anemia 10/23/2015  . Left lumbar radiculitis 10/23/2015  . Absence of menstruation 04/26/2015  . B12 deficiency 04/26/2015  . Depression, major, recurrent, mild ([place]) 04/26/2015  . Dyslipidemia 04/26/2015  . Dysmetabolic syndrome 48/18/5631  . Bariatric surgery status 04/26/2015  . H/O: HTN (hypertension) 04/26/2015  . Psoriasis 04/26/2015  . Allergic rhinitis, seasonal 04/26/2015  . Vitamin D deficiency 04/26/2015  . Bilateral knee pain 04/26/2015    Past Surgical History:  Procedure Laterality Date  . BARIATRIC SURGERY    . CHOLECYSTECTOMY    . DILATION AND CURETTAGE OF UTERUS    . LUMBAR SPINE SURGERY Left 02/2016   Pt reports lumbar surgery without placement of instrumentation to decompress nerve on left side.     Family History  Problem Relation Age of Onset  . Diabetes Mother   . Skin cancer Mother   . Diabetes Father   . COPD Father   . Seizures Father   . Heart disease Father 69       CABG  . Diabetes Brother   . Multiple sclerosis Brother   . Stroke Paternal Grandmother   . Eczema Son     Social History   Socioeconomic History  . Marital status: Married    Spouse name: Lennette Bihari  . Number of children: 1  . Years of education: Not on file  . Highest education level: Associate degree: occupational, Hotel manager, or vocational program  Occupational History  . Not on file  Social Needs  . Financial resource strain: Not hard at all  . Food insecurity    Worry: Never true    Inability: Never true  . Transportation needs    Medical: No    Non-medical: No  Tobacco Use  . Smoking status: Never Smoker  . Smokeless tobacco: Never Used  Substance and Sexual Activity  . Alcohol use: No    Alcohol/week: 0.0 standard drinks  . Drug use: No  . Sexual activity: Yes    Partners: Male    Birth control/protection: None   Lifestyle  . Physical activity    Days per week: 3 days    Minutes per session: 30 min  . Stress: Not at all  Relationships  . Social connections    Talks on phone: More than three times a week    Gets together: More than three times a week    Attends religious service: More than 4 times per year    Active member of club or organization: Yes  Attends meetings of clubs or organizations: More than 4 times per year    Relationship status: Married  . Intimate partner violence    Fear of current or ex partner: No    Emotionally abused: No    Physically abused: No    Forced sexual activity: No  Other Topics Concern  . Not on file  Social History Narrative  . Not on file     Current Outpatient Medications:  .  acetaminophen (TYLENOL) 500 MG tablet, Take 1,000 mg by mouth as needed for moderate pain., Disp: , Rfl:  .  Apremilast (OTEZLA) 30 MG TABS, Take 1 tablet by mouth 2 (two) times daily. For psoriasis, Disp: , Rfl:  .  cyclobenzaprine (FLEXERIL) 10 MG tablet, TAKE 1 TABLET BY MOUTH 3  TIMES DAILY AS NEEDED FOR  MUSCLE SPASM(S), Disp: 90 tablet, Rfl: 0 .  DULoxetine (CYMBALTA) 60 MG capsule, TAKE 1 CAPSULE(60 MG) BY MOUTH DAILY, Disp: 90 capsule, Rfl: 0 .  fluticasone (FLONASE) 50 MCG/ACT nasal spray, Place 2 sprays into both nostrils daily., Disp: 16 g, Rfl: 6 .  gabapentin (NEURONTIN) 300 MG capsule, TAKE 1 CAPSULE(300 MG) BY MOUTH AT BEDTIME, Disp: 90 capsule, Rfl: 1 .  metFORMIN (GLUCOPHAGE-XR) 500 MG 24 hr tablet, TAKE 1 TABLET BY MOUTH DAILY WITH BREAKFAST, Disp: 90 tablet, Rfl: 1 .  Multiple Vitamin (MULTIVITAMIN) tablet, , Disp: , Rfl:  .  omeprazole (PRILOSEC) 40 MG capsule, Take 1 capsule (40 mg total) by mouth daily. (Patient not taking: Reported on 01/31/2019), Disp: 30 capsule, Rfl: 0 .  triamcinolone cream (KENALOG) 0.1 %, , Disp: , Rfl: 2  Allergies  Allergen Reactions  . Other Other (See Comments)    Any fragrant smell sets her off  . Codeine   . Latex     Pt  reports prolonged exposure causes aggravation in psoriasis  . Bacitracin-Polymyxin B Rash    Band-Aid ONLY redness if left on for long periods   states no to latex   use paper tape please.  06/02/2018: pt reports she does not remember having a reaction to this medication      ROS  Constitutional: Negative for fever or weight change.  Respiratory: Negative for cough and shortness of breath.   Cardiovascular: Negative for chest pain or palpitations.  Gastrointestinal: Negative for abdominal pain, no bowel changes.  Musculoskeletal: Negative for gait problem or joint swelling.  Skin: Negative for rash.  Neurological: Negative for dizziness or headache.  No other specific complaints in a complete review of systems (except as listed in HPI above).  Objective  Vitals:   01/31/19 0749  BP: 122/76  Pulse: (!) 110  Resp: 16  Temp: 98 F (36.7 C)  TempSrc: Oral  SpO2: 97%  Weight: 178 lb (80.7 kg)  Height: 5\' 4"  (1.626 m)    Body mass index is 30.55 kg/m.  Physical Exam  Constitutional: Patient appears well-developed and obese No distress.  HENT: Head: Normocephalic and atraumatic. Ears: B TMs ok, no erythema or effusion; Nose: Nose normal. Mouth/Throat: Oropharynx is clear and moist. No oropharyngeal exudate.  Eyes: Conjunctivae and EOM are normal. Pupils are equal, round, and reactive to light. No scleral icterus.  Neck: Normal range of motion. Neck supple. No JVD present. No thyromegaly present.  Cardiovascular: Normal rate, regular rhythm and normal heart sounds.  No murmur heard. No BLE edema. Pulmonary/Chest: Effort normal and breath sounds normal. No respiratory distress. Abdominal: Soft. Bowel sounds are normal, no distension. There is no  tenderness. no masses Breast: no lumps or masses, no nipple discharge or rashes FEMALE GENITALIA:  External genitalia normal External urethra normal Vaginal vault normal without discharge or lesions Cervix normal without discharge  or lesions Bimanual exam normal without masses RECTAL: not done  Musculoskeletal: Normal range of motion, no joint effusions. No gross deformities Neurological: he is alert and oriented to person, place, and time. No cranial nerve deficit. Coordination, balance, strength, speech and gait are normal.  Skin: very mild eczematous patches on hands Psychiatric: Patient has a normal mood and affect. behavior is normal. Judgment and thought content normal.  Recent Results (from the past 2160 hour(s))  Hemoglobin A1c     Status: Abnormal   Collection Time: 01/28/19  8:25 AM  Result Value Ref Range   Hgb A1c MFr Bld 5.9 (H) 4.8 - 5.6 %    Comment:          Prediabetes: 5.7 - 6.4          Diabetes: >6.4          Glycemic control for adults with diabetes: <7.0    Est. average glucose Bld gHb Est-mCnc 123 mg/dL  Lipid panel     Status: Abnormal   Collection Time: 01/28/19  8:25 AM  Result Value Ref Range   Cholesterol, Total 234 (H) 100 - 199 mg/dL   Triglycerides 78 0 - 149 mg/dL   HDL 96 >39 mg/dL   VLDL Cholesterol Cal 16 5 - 40 mg/dL   LDL Calculated 122 (H) 0 - 99 mg/dL   Chol/HDL Ratio 2.4 0.0 - 4.4 ratio    Comment:                                   T. Chol/HDL Ratio                                             Men  Women                               1/2 Avg.Risk  3.4    3.3                                   Avg.Risk  5.0    4.4                                2X Avg.Risk  9.6    7.1                                3X Avg.Risk 23.4   11.0       PHQ2/9: Depression screen [facility] 2/9 01/31/2019 01/11/2019 05/12/2018 05/12/2018 11/09/2017  Decreased Interest 0 0 0 0 0  Down, Depressed, Hopeless 1 0 0 0 0  PHQ - 2 Score 1 0 0 0 0  Altered sleeping 1 0 1 - 1  Tired, decreased energy 0 0 1 - 1  Change in appetite 1 0 0 - 1  Feeling bad or failure about yourself  0 0 0 - 0  Trouble concentrating 0 0 0 - 0  Moving slowly or fidgety/restless 0 0 0 - 0  Suicidal thoughts 0 0 0 - 0  PHQ-9  Score 3 0 2 - 3  Difficult doing work/chores Not difficult at all Not difficult at all Not difficult at all - Not difficult at all     Fall Risk: Fall Risk  01/31/2019 01/11/2019 05/12/2018 11/09/2017 06/30/2017  Falls in the past year? 0 0 No No No  Comment - - - - -  Number falls in past yr: 0 0 - - -  Injury with Fall? 0 0 - - -  Comment - - - - -  Follow up - Falls evaluation completed - - -    Functional Status Survey: Is the patient deaf or have difficulty hearing?: No(currently left ear is stopped up) Does the patient have difficulty seeing, even when wearing glasses/contacts?: No Does the patient have difficulty concentrating, remembering, or making decisions?: No Does the patient have difficulty walking or climbing stairs?: No(sometimes due to back pain) Does the patient have difficulty dressing or bathing?: No Does the patient have difficulty doing errands alone such as visiting a doctor's office or shopping?: No   Assessment & Plan  1. Well woman exam  - Comprehensive metabolic panel - CBC with Differential/Platelet - VITAMIN D 25 Hydroxy (Vit-D Deficiency, Fractures) - B12 and Folate Panel - Hepatitis C antibody - Iron, TIBC and Ferritin Panel  2. Cervical cancer screening  - Pap IG and HPV (high risk) DNA detection  3. Breast cancer screening  - MM Digital Screening; Future  4. Colon cancer screening  She has an order for FIT test  5. Vertigo, benign paroxysmal, left  - Ambulatory referral to ENT  6. Pre-diabetes  Resume a healthier diet   -USPSTF grade A and B recommendations reviewed with patient; age-appropriate recommendations, preventive care, screening tests, etc discussed and encouraged; healthy living encouraged; see AVS for patient education given to patient -Discussed importance of 150 minutes of physical activity weekly, eat two servings of fish weekly, eat one serving of tree nuts ( cashews, pistachios, pecans, almonds.Marland Kitchen) every other day,  eat 6 servings of fruit/vegetables daily and drink plenty of water and avoid sweet beverages.

## 2019-02-08 ENCOUNTER — Encounter: Payer: Self-pay | Admitting: Family Medicine

## 2019-02-09 ENCOUNTER — Encounter: Payer: Self-pay | Admitting: Family Medicine

## 2019-02-09 LAB — PAP IG AND HPV HIGH-RISK: HPV, high-risk: NEGATIVE

## 2019-03-14 ENCOUNTER — Ambulatory Visit
Admission: RE | Admit: 2019-03-14 | Discharge: 2019-03-14 | Disposition: A | Discharge status: Home / Self Care | Payer: Managed Care, Other (non HMO) | Source: Ambulatory Visit | Attending: Family Medicine | Admitting: Family Medicine

## 2019-03-14 DIAGNOSIS — Z1231 Encounter for screening mammogram for malignant neoplasm of breast: Secondary | ICD-10-CM | POA: Insufficient documentation

## 2019-03-14 DIAGNOSIS — Z1239 Encounter for other screening for malignant neoplasm of breast: Secondary | ICD-10-CM

## 2019-03-15 ENCOUNTER — Other Ambulatory Visit: Payer: Self-pay | Admitting: Family Medicine

## 2019-03-15 DIAGNOSIS — E8881 Metabolic syndrome: Secondary | ICD-10-CM

## 2019-03-15 DIAGNOSIS — F330 Major depressive disorder, recurrent, mild: Secondary | ICD-10-CM

## 2019-03-15 DIAGNOSIS — M5416 Radiculopathy, lumbar region: Secondary | ICD-10-CM

## 2019-03-15 DIAGNOSIS — Z9884 Bariatric surgery status: Secondary | ICD-10-CM

## 2019-04-06 ENCOUNTER — Other Ambulatory Visit: Payer: Self-pay | Admitting: Family Medicine

## 2019-04-06 DIAGNOSIS — M5416 Radiculopathy, lumbar region: Secondary | ICD-10-CM

## 2019-04-07 MED ORDER — CYCLOBENZAPRINE HCL 10 MG PO TABS: ORAL_TABLET | ORAL | 0 refills | Status: DC

## 2019-07-12 ENCOUNTER — Other Ambulatory Visit: Payer: Self-pay | Admitting: Family Medicine

## 2019-07-12 DIAGNOSIS — M5416 Radiculopathy, lumbar region: Secondary | ICD-10-CM

## 2019-07-13 ENCOUNTER — Ambulatory Visit: Payer: Managed Care, Other (non HMO) | Admitting: Family Medicine

## 2019-07-13 MED ORDER — CYCLOBENZAPRINE HCL 10 MG PO TABS: ORAL_TABLET | ORAL | 0 refills | Status: DC

## 2019-08-02 ENCOUNTER — Ambulatory Visit (INDEPENDENT_AMBULATORY_CARE_PROVIDER_SITE_OTHER): Payer: Managed Care, Other (non HMO) | Admitting: Family Medicine

## 2019-08-02 ENCOUNTER — Encounter: Payer: Self-pay | Admitting: Family Medicine

## 2019-08-02 DIAGNOSIS — M5416 Radiculopathy, lumbar region: Secondary | ICD-10-CM | POA: Diagnosis not present

## 2019-08-02 DIAGNOSIS — E8881 Metabolic syndrome: Secondary | ICD-10-CM

## 2019-08-02 DIAGNOSIS — F330 Major depressive disorder, recurrent, mild: Secondary | ICD-10-CM

## 2019-08-02 DIAGNOSIS — Z9884 Bariatric surgery status: Secondary | ICD-10-CM

## 2019-08-02 MED ORDER — GABAPENTIN 300 MG PO CAPS: ORAL_CAPSULE | ORAL | 1 refills | Status: DC

## 2019-08-02 MED ORDER — METFORMIN HCL ER 500 MG PO TB24: 500.0000 mg | ORAL_TABLET | Freq: Every day | ORAL | 1 refills | Status: DC

## 2019-08-02 MED ORDER — CYCLOBENZAPRINE HCL 10 MG PO TABS: ORAL_TABLET | ORAL | 0 refills | Status: DC

## 2019-08-02 MED ORDER — DULOXETINE HCL 60 MG PO CPEP: 60.0000 mg | ORAL_CAPSULE | Freq: Every day | ORAL | 1 refills | Status: DC

## 2019-08-02 NOTE — Progress Notes (Signed)
Name: Ashlee Cruz   MRN: [id number]    DOB: 1965-12-20   Date:08/02/2019       Progress Note  Subjective  Chief Complaint  Chief Complaint  Patient presents with  . Follow-up    6 month follow up  . Medication Refill    I connected with  KIERNEY ORECCHIO  on 08/02/19 at  8:00 AM EST by a video enabled telemedicine application and verified that I am speaking with the correct person using two identifiers.  I discussed the limitations of evaluation and management by telemedicine and the availability of in person appointments. The patient expressed understanding and agreed to proceed. Staff also discussed with the patient that there may be a patient responsible charge related to this service. Patient Location: at home Provider Location: [facility]   HPI    Pre-diabetes: last A1C was 5.9%, she has been stress eating a little more and has not been as physically active, but they are moving to a new house in front of a park in [place] and is going to increase her level of physical activity in January. She is still taking Metformin and denies side effects.   Left lower back pain: she had back surgery done by Dr. Consuella Lose in July 2017 and was doing great, but a few months after the surgery she had a flare. She was seen by Dr. Kathyrn Sheriff and had steroid injection end of November 2017and symptoms improved a little, she had another injection January 2018 Back pain is chronic and aching2/10at times it goes up to 5/10, she states at times 6/10 burning that goes down to lateral thigh and sometimes down to her left foot, a little worse over the past month but not as severe as her lastflare back 03/2017 but did not need an injection She did try PT after her last visit in September 2019, she still does some exercises at home. She states taking  Duloxetine and Gabapentin daily, Flexeril and Tylenol prn and seems to be controlling symptoms.   Major Depression Mild  Recurrent:she is now on Cymbalta to help with pain and mood. She is able to focus, motivation is good.they lost her dog recently that was almost 19 yo but they are coping well. Her son is now in a private school and is getting A's . She may have been exposed to COVID-19 and is a little worried about it , otherwise feeling well    Patient Active Problem List   Diagnosis Date Noted  . History of iron deficiency 01/11/2019  . Tachycardia 05/19/2017  . Palpitations 05/19/2017  . Fibroid uterus 12/20/2015  . History of iron deficiency anemia 10/23/2015  . Left lumbar radiculitis 10/23/2015  . Absence of menstruation 04/26/2015  . B12 deficiency 04/26/2015  . Depression, major, recurrent, mild ([place]) 04/26/2015  . Dyslipidemia 04/26/2015  . Dysmetabolic syndrome [id number]  . Bariatric surgery status 04/26/2015  . H/O: HTN (hypertension) 04/26/2015  . Psoriasis 04/26/2015  . Allergic rhinitis, seasonal 04/26/2015  . Vitamin D deficiency 04/26/2015  . Bilateral knee pain 04/26/2015    Past Surgical History:  Procedure Laterality Date  . BARIATRIC SURGERY    . CHOLECYSTECTOMY    . DILATION AND CURETTAGE OF UTERUS    . LUMBAR SPINE SURGERY Left 02/2016   Pt reports lumbar surgery without placement of instrumentation to decompress nerve on left side.     Family History  Problem Relation Age of Onset  . Diabetes Mother   . Skin  cancer Mother   . Diabetes Father   . COPD Father   . Seizures Father   . Heart disease Father 40       CABG  . Diabetes Brother   . Multiple sclerosis Brother   . Stroke Paternal Grandmother   . Eczema Son     Social History   Socioeconomic History  . Marital status: Married    Spouse name: Lennette Bihari  . Number of children: 1  . Years of education: Not on file  . Highest education level: Associate degree: occupational, Hotel manager, or vocational program  Occupational History  . Not on file  Tobacco Use  . Smoking status: Never Smoker  . Smokeless  tobacco: Never Used  Substance and Sexual Activity  . Alcohol use: No    Alcohol/week: 0.0 standard drinks  . Drug use: No  . Sexual activity: Yes    Partners: Male    Birth control/protection: None  Other Topics Concern  . Not on file  Social History Narrative  . Not on file   Social Determinants of Health   Financial Resource Strain: Low Risk   . Difficulty of Paying Living Expenses: Not hard at all  Food Insecurity: No Food Insecurity  . Worried About Charity fundraiser in the Last Year: Never true  . Ran Out of Food in the Last Year: Never true  Transportation Needs: No Transportation Needs  . Lack of Transportation (Medical): No  . Lack of Transportation (Non-Medical): No  Physical Activity: Insufficiently Active  . Days of Exercise per Week: 2 days  . Minutes of Exercise per Session: 20 min  Stress: No Stress Concern Present  . Feeling of Stress : Only a little  Social Connections: Not Isolated  . Frequency of Communication with Friends and Family: More than three times a week  . Frequency of Social Gatherings with Friends and Family: More than three times a week  . Attends Religious Services: More than 4 times per year  . Active Member of Clubs or Organizations: Yes  . Attends Archivist Meetings: More than 4 times per year  . Marital Status: Married  Human resources officer Violence: Not At Risk  . Fear of Current or Ex-Partner: No  . Emotionally Abused: No  . Physically Abused: No  . Sexually Abused: No     Current Outpatient Medications:  .  acetaminophen (TYLENOL) 500 MG tablet, Take 1,000 mg by mouth as needed for moderate pain., Disp: , Rfl:  .  Apremilast (OTEZLA) 30 MG TABS, Take 1 tablet by mouth 2 (two) times daily. For psoriasis, Disp: , Rfl:  .  BIOTIN 5000 PO, Take by mouth., Disp: , Rfl:  .  cholecalciferol (VITAMIN D3) 25 MCG (1000 UT) tablet, Take 2,000 Units by mouth daily., Disp: , Rfl:  .  cyclobenzaprine (FLEXERIL) 10 MG tablet, TAKE 1  TABLET BY MOUTH 3  TIMES DAILY AS NEEDED FOR  MUSCLE SPASM(S), Disp: 90 tablet, Rfl: 0 .  DULoxetine (CYMBALTA) 60 MG capsule, Take 1 capsule (60 mg total) by mouth daily., Disp: 90 capsule, Rfl: 1 .  gabapentin (NEURONTIN) 300 MG capsule, TAKE 1 CAPSULE(300 MG) BY MOUTH AT BEDTIME, Disp: 90 capsule, Rfl: 1 .  metFORMIN (GLUCOPHAGE-XR) 500 MG 24 hr tablet, Take 1 tablet (500 mg total) by mouth daily with breakfast., Disp: 90 tablet, Rfl: 1 .  Multiple Vitamin (MULTIVITAMIN) tablet, , Disp: , Rfl:  .  fluticasone (FLONASE) 50 MCG/ACT nasal spray, Place 2 sprays into both nostrils  daily. (Patient not taking: Reported on 08/02/2019), Disp: 16 g, Rfl: 6 .  triamcinolone cream (KENALOG) 0.1 %, , Disp: , Rfl: 2  Allergies  Allergen Reactions  . Other Other (See Comments)    Any fragrant smell sets her off  . Codeine   . Latex     Pt reports prolonged exposure causes aggravation in psoriasis    I personally reviewed active problem list, medication list, allergies, family history, social history, health maintenance with the patient/caregiver today.   ROS  Ten systems reviewed and is negative except as mentioned in HPI   Objective  Virtual encounter, vitals not obtained.  There is no height or weight on file to calculate BMI.  Physical Exam  Awake, alert and oriented  PHQ2/9: Depression screen [facility] 2/9 08/02/2019 01/31/2019 01/11/2019 05/12/2018 05/12/2018  Decreased Interest 0 0 0 0 0  Down, Depressed, Hopeless 0 1 0 0 0  PHQ - 2 Score 0 1 0 0 0  Altered sleeping 0 1 0 1 -  Tired, decreased energy 0 0 0 1 -  Change in appetite 0 1 0 0 -  Feeling bad or failure about yourself  0 0 0 0 -  Trouble concentrating 0 0 0 0 -  Moving slowly or fidgety/restless 0 0 0 0 -  Suicidal thoughts 0 0 0 0 -  PHQ-9 Score 0 3 0 2 -  Difficult doing work/chores Not difficult at all Not difficult at all Not difficult at all Not difficult at all -   PHQ-2/9 Result is negative.    Fall Risk: Fall  Risk  08/02/2019 01/31/2019 01/11/2019 05/12/2018 11/09/2017  Falls in the past year? 1 0 0 No No  Comment - - - - -  Number falls in past yr: 0 0 0 - -  Injury with Fall? 0 0 0 - -  Comment - - - - -  Follow up - - Falls evaluation completed - -     Assessment & Plan   1. Dysmetabolic syndrome  - metFORMIN (GLUCOPHAGE-XR) 500 MG 24 hr tablet; Take 1 tablet (500 mg total) by mouth daily with breakfast.  Dispense: 90 tablet; Refill: 1  2. Bariatric surgery status  - metFORMIN (GLUCOPHAGE-XR) 500 MG 24 hr tablet; Take 1 tablet (500 mg total) by mouth daily with breakfast.  Dispense: 90 tablet; Refill: 1  3. Left lumbar radiculitis  - gabapentin (NEURONTIN) 300 MG capsule; TAKE 1 CAPSULE(300 MG) BY MOUTH AT BEDTIME  Dispense: 90 capsule; Refill: 1 - DULoxetine (CYMBALTA) 60 MG capsule; Take 1 capsule (60 mg total) by mouth daily.  Dispense: 90 capsule; Refill: 1 - cyclobenzaprine (FLEXERIL) 10 MG tablet; TAKE 1 TABLET BY MOUTH 3  TIMES DAILY AS NEEDED FOR  MUSCLE SPASM(S)  Dispense: 90 tablet; Refill: 0  4. Depression, major, recurrent, mild ([facility])  - DULoxetine (CYMBALTA) 60 MG capsule; Take 1 capsule (60 mg total) by mouth daily.  Dispense: 90 capsule; Refill: 1  I provided 25 minutes of non-face-to-face time during this encounter.

## 2019-08-03 ENCOUNTER — Encounter: Payer: Self-pay | Admitting: Family Medicine

## 2019-12-08 ENCOUNTER — Telehealth: Payer: Self-pay | Admitting: Family Medicine

## 2019-12-08 NOTE — Telephone Encounter (Signed)
Dr Valma Cava is calling from [place] regarding the mutual patient wanting to confirm if the patient is diabetic or diabetic suspect? And what was the patient's last A1C Cb- [phone]

## 2019-12-16 IMAGING — MG DIGITAL SCREENING BILATERAL MAMMOGRAM WITH TOMO AND CAD
8 series · 8 of 24 positions shown · non-contrast
Comparison: Previous exam(s).

CLINICAL DATA: Screening.

EXAM:
DIGITAL SCREENING BILATERAL MAMMOGRAM WITH TOMO AND CAD

[L CC synth-2D]
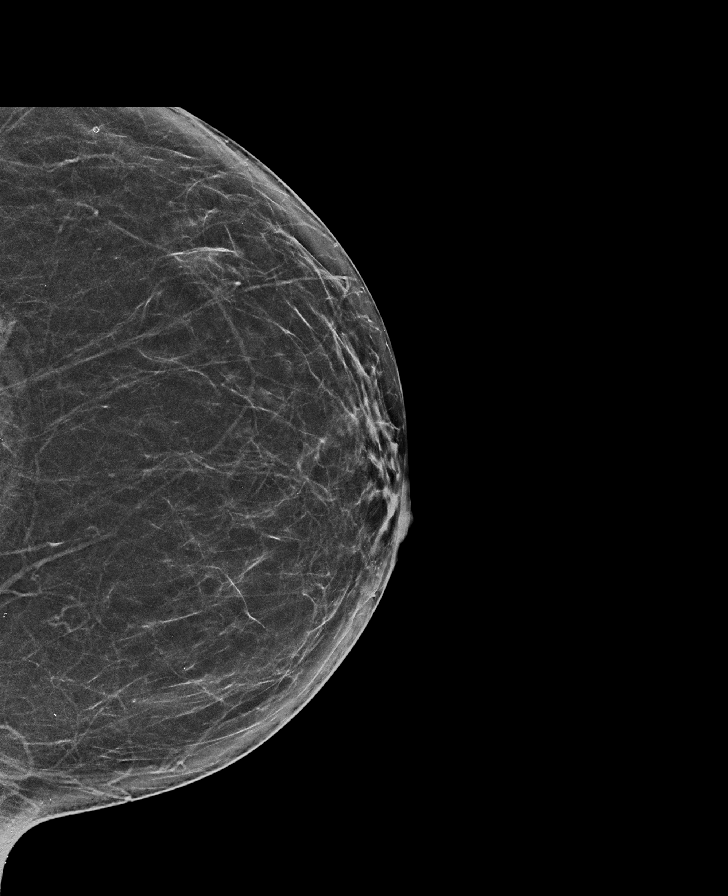

[L MLO synth-2D]
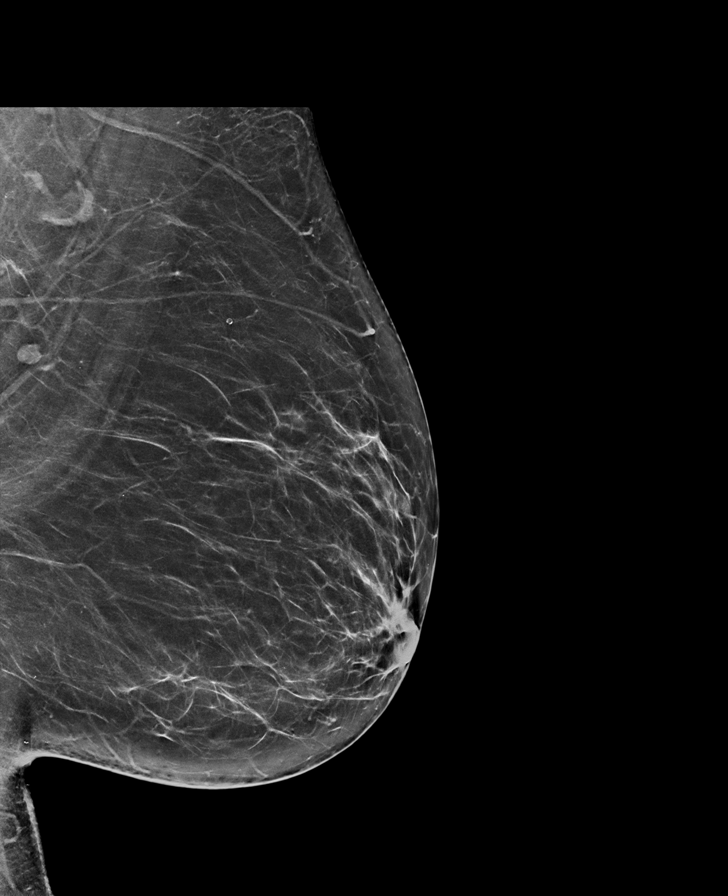

[R MLO synth-2D]
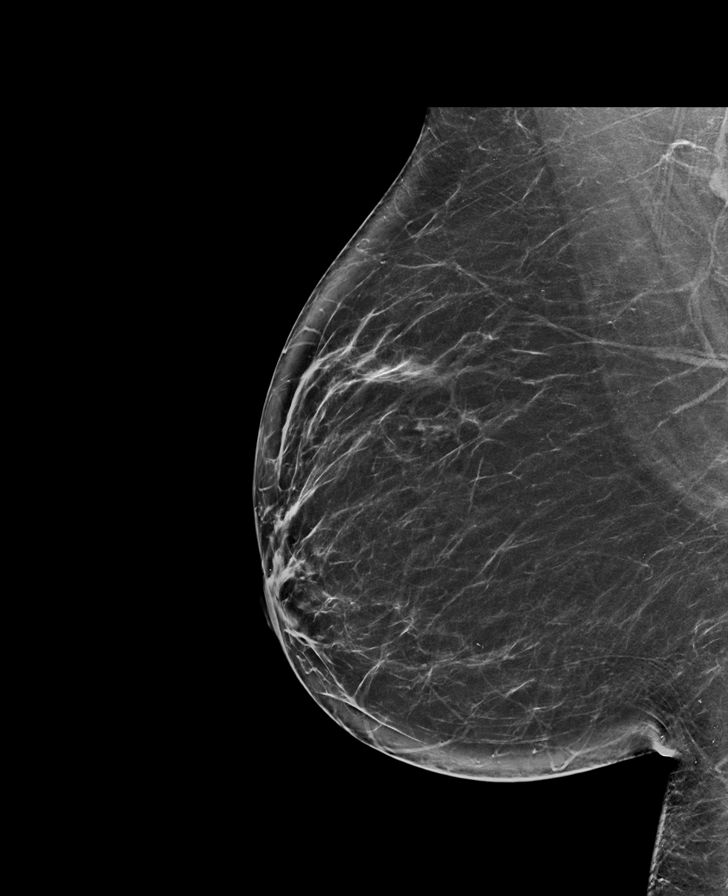

[R CC synth-2D]
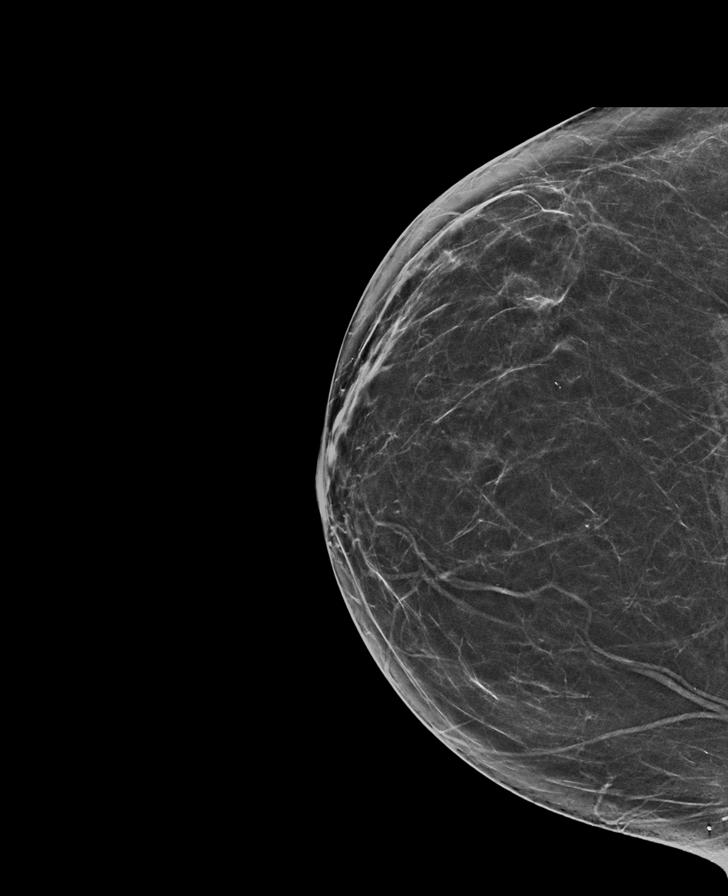

[R MLO tomo · tomo slice 40/79.0]
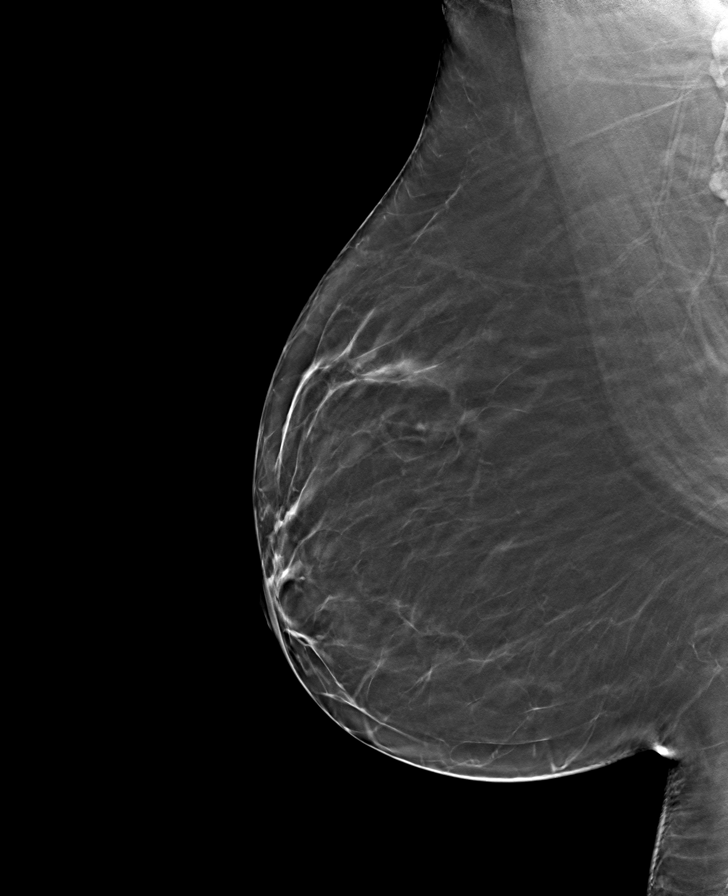

[L MLO tomo · tomo slice 40/79.0]
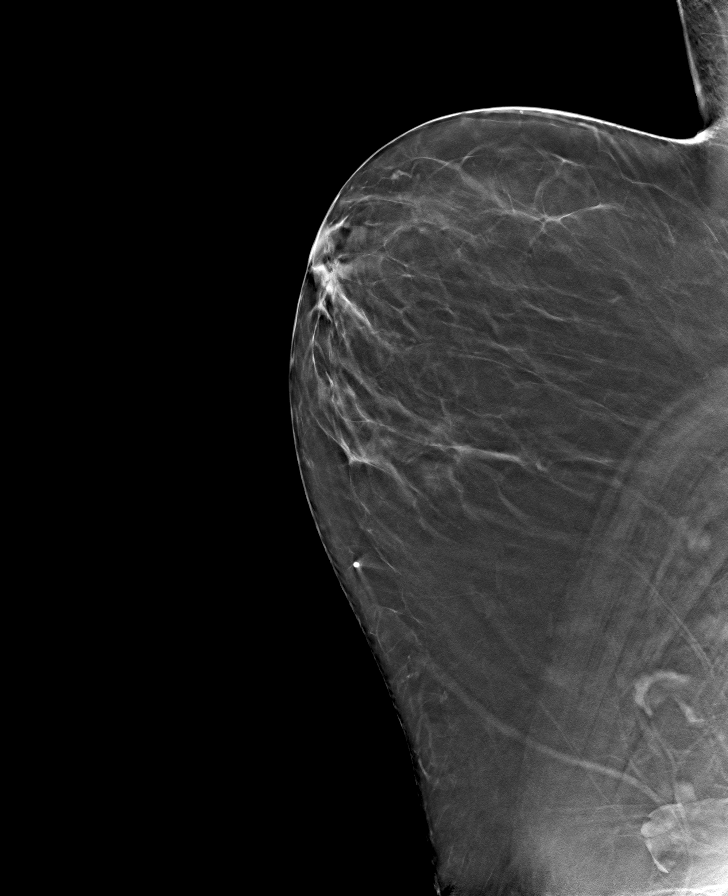

[R CC tomo · tomo slice 35/69.0]
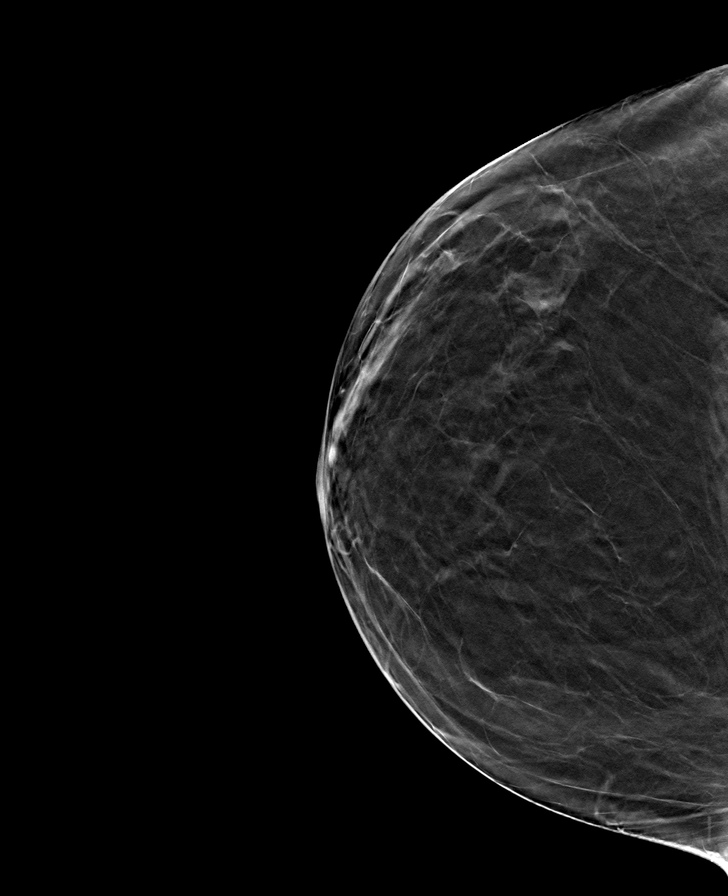

[L CC tomo · tomo slice 33/65.0]
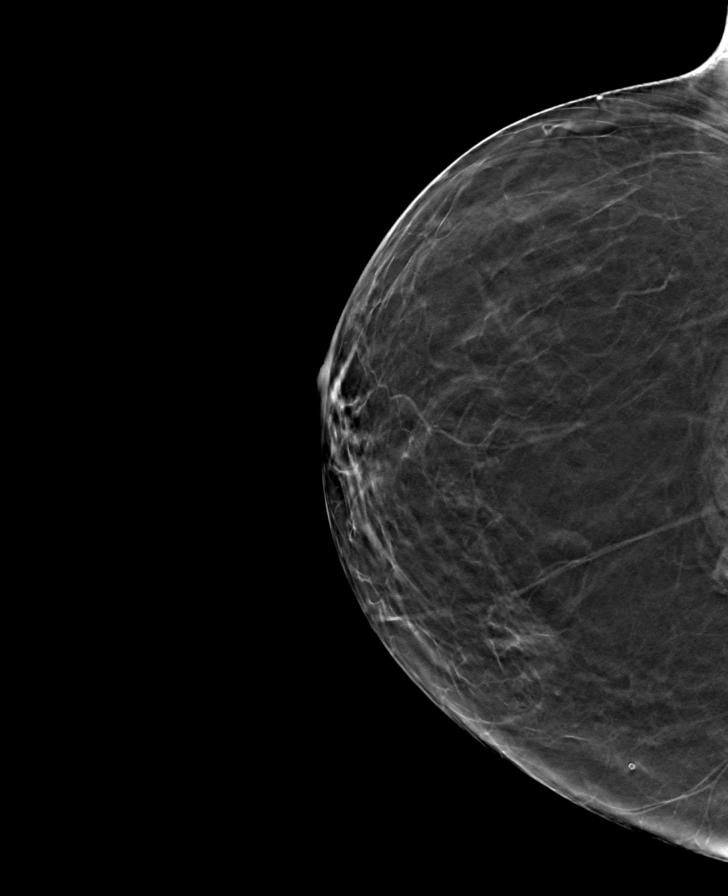

[8 of 24 positions shown; findings below may reference images not displayed]

ACR Breast Density Category b: There are scattered areas of
fibroglandular density.
FINDINGS: There are no findings suspicious for malignancy. Images were
processed with CAD.
IMPRESSION: No mammographic evidence of malignancy. A result letter of this
screening mammogram will be mailed directly to the patient.

RECOMMENDATION:
Screening mammogram in one year. (Code:CN-[id number])

BI-RADS CATEGORY  1: Negative.

## 2020-01-17 ENCOUNTER — Ambulatory Visit: Payer: Managed Care, Other (non HMO) | Admitting: Family Medicine

## 2020-01-17 ENCOUNTER — Other Ambulatory Visit: Payer: Self-pay

## 2020-01-17 ENCOUNTER — Encounter: Payer: Self-pay | Admitting: Family Medicine

## 2020-01-17 VITALS — BP 118/72 | HR 97 | Temp 98.1°F | Resp 14 | Ht 61.0 in | Wt 172.3 lb

## 2020-01-17 DIAGNOSIS — E8881 Metabolic syndrome: Secondary | ICD-10-CM

## 2020-01-17 DIAGNOSIS — Z23 Encounter for immunization: Secondary | ICD-10-CM

## 2020-01-17 DIAGNOSIS — E538 Deficiency of other specified B group vitamins: Secondary | ICD-10-CM

## 2020-01-17 DIAGNOSIS — Z1211 Encounter for screening for malignant neoplasm of colon: Secondary | ICD-10-CM

## 2020-01-17 DIAGNOSIS — Z1212 Encounter for screening for malignant neoplasm of rectum: Secondary | ICD-10-CM

## 2020-01-17 DIAGNOSIS — Z9884 Bariatric surgery status: Secondary | ICD-10-CM

## 2020-01-17 DIAGNOSIS — F330 Major depressive disorder, recurrent, mild: Secondary | ICD-10-CM

## 2020-01-17 DIAGNOSIS — Z1159 Encounter for screening for other viral diseases: Secondary | ICD-10-CM

## 2020-01-17 DIAGNOSIS — E785 Hyperlipidemia, unspecified: Secondary | ICD-10-CM

## 2020-01-17 DIAGNOSIS — M5416 Radiculopathy, lumbar region: Secondary | ICD-10-CM

## 2020-01-17 DIAGNOSIS — Z8639 Personal history of other endocrine, nutritional and metabolic disease: Secondary | ICD-10-CM

## 2020-01-17 DIAGNOSIS — E559 Vitamin D deficiency, unspecified: Secondary | ICD-10-CM

## 2020-01-17 MED ORDER — DULOXETINE HCL 60 MG PO CPEP: 60.0000 mg | ORAL_CAPSULE | Freq: Every day | ORAL | 1 refills | Status: DC

## 2020-01-17 MED ORDER — GABAPENTIN 300 MG PO CAPS: ORAL_CAPSULE | ORAL | 1 refills | Status: DC

## 2020-01-17 MED ORDER — METFORMIN HCL ER 500 MG PO TB24: 500.0000 mg | ORAL_TABLET | Freq: Every day | ORAL | 1 refills | Status: DC

## 2020-01-17 MED ORDER — CYCLOBENZAPRINE HCL 10 MG PO TABS: ORAL_TABLET | ORAL | 1 refills | Status: DC

## 2020-01-17 NOTE — Progress Notes (Signed)
Name: Ashlee Cruz   MRN: [id number]    DOB: 1966/01/23   Date:01/17/2020       Progress Note  Subjective  Chief Complaint  Chief Complaint  Patient presents with  . Medication Refill  . Allergies    HPI  Pre-diabetes: last A1C was 5.9%, she is due for repeat labs, she has been more active lately, she denies polyphagia, polydipsia or polyphagia. She is on Metformin, she lost 6 lbs since last visit.   History of bariatric surgery: she has a history of iron deficiency anemia, she is taking MVI, her weight prior to surgery was 236 lbs, her weight went down to mid 130's, but has been stable in the 170 lbs range. Trending down a little since last visit.   Left lower back pain: she had back surgery done by Dr. Consuella Lose in July 2017 and was doing great, but a few months after the surgery she had a flare. She was seen by Dr. Kathyrn Sheriff and had steroid injection end of November 2017and symptoms improved a little, she had another injection January 2018 She states she is doing well, no pain going up and down stairs, she states sleeps better when she takes flexeril and would like a refill of medication  Major Depression Mild Recurrent:she is now on Cymbalta to help with pain and mood. She is able to focus, motivation is good. She had a small relapse in Dec, they moved, mother in law died , husband had a heart attack, but she is doing well now. On dulxetine.   Palpitation: she has intermittent symptoms, occasionally gets dizzy when she gets up quickly, but no chest pain   B12 and vitamin D deficiency: we will recheck labs, taking vitamin D supplementation but does not like taking B12   Eczema: both hands , well controlled with medication  Patient Active Problem List   Diagnosis Date Noted  . History of iron deficiency 01/11/2019  . Tachycardia 05/19/2017  . Palpitations 05/19/2017  . Fibroid uterus 12/20/2015  . History of iron deficiency anemia 10/23/2015  . Left lumbar radiculitis  10/23/2015  . Absence of menstruation 04/26/2015  . B12 deficiency 04/26/2015  . Depression, major, recurrent, mild ([place]) 04/26/2015  . Dyslipidemia 04/26/2015  . Dysmetabolic syndrome [id number]  . Bariatric surgery status 04/26/2015  . H/O: HTN (hypertension) 04/26/2015  . Psoriasis 04/26/2015  . Allergic rhinitis, seasonal 04/26/2015  . Vitamin D deficiency 04/26/2015  . Bilateral knee pain 04/26/2015    Past Surgical History:  Procedure Laterality Date  . BARIATRIC SURGERY    . CHOLECYSTECTOMY    . DILATION AND CURETTAGE OF UTERUS    . LUMBAR SPINE SURGERY Left 02/2016   Pt reports lumbar surgery without placement of instrumentation to decompress nerve on left side.     Family History  Problem Relation Age of Onset  . Diabetes Mother   . Skin cancer Mother   . Diabetes Father   . COPD Father   . Seizures Father   . Heart disease Father 13       CABG  . Diabetes Brother   . Multiple sclerosis Brother   . Stroke Paternal Grandmother   . Eczema Son     Social History   Tobacco Use  . Smoking status: Never Smoker  . Smokeless tobacco: Never Used  Substance Use Topics  . Alcohol use: No    Alcohol/week: 0.0 standard drinks     Current Outpatient Medications:  .  acetaminophen (TYLENOL) 500 MG  tablet, Take 1,000 mg by mouth as needed for moderate pain., Disp: , Rfl:  .  Apremilast (OTEZLA) 30 MG TABS, Take 1 tablet by mouth 2 (two) times daily. For psoriasis, Disp: , Rfl:  .  BIOTIN 5000 PO, Take by mouth., Disp: , Rfl:  .  cholecalciferol (VITAMIN D3) 25 MCG (1000 UT) tablet, Take 2,000 Units by mouth daily., Disp: , Rfl:  .  DULoxetine (CYMBALTA) 60 MG capsule, Take 1 capsule (60 mg total) by mouth daily., Disp: 90 capsule, Rfl: 1 .  fluticasone (FLONASE) 50 MCG/ACT nasal spray, Place 2 sprays into both nostrils daily., Disp: 16 g, Rfl: 6 .  gabapentin (NEURONTIN) 300 MG capsule, TAKE 1 CAPSULE(300 MG) BY MOUTH AT BEDTIME, Disp: 90 capsule, Rfl: 1 .  metFORMIN  (GLUCOPHAGE-XR) 500 MG 24 hr tablet, Take 1 tablet (500 mg total) by mouth daily with breakfast., Disp: 90 tablet, Rfl: 1 .  Multiple Vitamin (MULTIVITAMIN) tablet, , Disp: , Rfl:  .  cyclobenzaprine (FLEXERIL) 10 MG tablet, TAKE 1 TABLET BY MOUTH 3  TIMES DAILY AS NEEDED FOR  MUSCLE SPASM(S) (Patient not taking: Reported on 01/17/2020), Disp: 90 tablet, Rfl: 0 .  triamcinolone cream (KENALOG) 0.1 %, , Disp: , Rfl: 2  Allergies  Allergen Reactions  . Other Other (See Comments)    Any fragrant smell sets her off  . Codeine   . Latex     Pt reports prolonged exposure causes aggravation in psoriasis    I personally reviewed active problem list, medication list, allergies, family history, social history, health maintenance with the patient/caregiver today.   ROS  Constitutional: Negative for fever, positive for mild  weight change.  Respiratory: Negative for cough and shortness of breath.   Cardiovascular: Negative for chest pain or palpitations.  Gastrointestinal: Negative for abdominal pain, no bowel changes.  Musculoskeletal: Negative for gait problem or joint swelling.  Skin: Negative for rash.  Neurological: Positive for dizziness but no headache.  No other specific complaints in a complete review of systems (except as listed in HPI above).  Objective  Vitals:   01/17/20 1442  BP: 118/72  Pulse: 97  Resp: 14  Temp: 98.1 F (36.7 C)  TempSrc: Temporal  SpO2: 97%  Weight: 172 lb 4.8 oz (78.2 kg)  Height: 5\' 1"  (1.549 m)    Body mass index is 32.56 kg/m.  Physical Exam  Constitutional: Patient appears well-developed and well-nourished. Obese No distress.  HEENT: head atraumatic, normocephalic, pupils equal and reactive to light, neck supple, oral mucosa not done  Cardiovascular: Normal rate, regular rhythm and normal heart sounds.  No murmur heard. No BLE edema. Pulmonary/Chest: Effort normal and breath sounds normal. No respiratory distress. Abdominal: Soft.  There is  no tenderness. Psychiatric: Patient has a normal mood and affect. behavior is normal. Judgment and thought content normal.  PHQ2/9: Depression screen [facility] 2/9 01/17/2020 08/02/2019 01/31/2019 01/11/2019 05/12/2018  Decreased Interest 0 0 0 0 0  Down, Depressed, Hopeless 0 0 1 0 0  PHQ - 2 Score 0 0 1 0 0  Altered sleeping 3 0 1 0 1  Tired, decreased energy 0 0 0 0 1  Change in appetite 0 0 1 0 0  Feeling bad or failure about yourself  0 0 0 0 0  Trouble concentrating 0 0 0 0 0  Moving slowly or fidgety/restless 0 0 0 0 0  Suicidal thoughts 0 0 0 0 0  PHQ-9 Score 3 0 3 0 2  Difficult doing  work/chores Not difficult at all Not difficult at all Not difficult at all Not difficult at all Not difficult at all  Some recent data might be hidden    phq 9 is negative   Fall Risk: Fall Risk  01/17/2020 08/02/2019 01/31/2019 01/11/2019 05/12/2018  Falls in the past year? 0 1 0 0 No  Comment - - - - -  Number falls in past yr: - 0 0 0 -  Injury with Fall? - 0 0 0 -  Comment - - - - -  Follow up - - - Falls evaluation completed -     Functional Status Survey: Is the patient deaf or have difficulty hearing?: No Does the patient have difficulty seeing, even when wearing glasses/contacts?: No Does the patient have difficulty concentrating, remembering, or making decisions?: No Does the patient have difficulty walking or climbing stairs?: No Does the patient have difficulty dressing or bathing?: No Does the patient have difficulty doing errands alone such as visiting a doctor's office or shopping?: No    Assessment & Plan   1. Depression, major, recurrent, mild ([facility])  - DULoxetine (CYMBALTA) 60 MG capsule; Take 1 capsule (60 mg total) by mouth daily.  Dispense: 90 capsule; Refill: 1  2. Bariatric surgery status  - metFORMIN (GLUCOPHAGE-XR) 500 MG 24 hr tablet; Take 1 tablet (500 mg total) by mouth daily with breakfast.  Dispense: 90 tablet; Refill: 1  3. Dysmetabolic syndrome  - Hemoglobin  A1c - metFORMIN (GLUCOPHAGE-XR) 500 MG 24 hr tablet; Take 1 tablet (500 mg total) by mouth daily with breakfast.  Dispense: 90 tablet; Refill: 1  4. B12 deficiency  - B12 and Folate Panel  5. Vitamin D deficiency  - VITAMIN D 25 Hydroxy (Vit-D Deficiency, Fractures)  6. Left lumbar radiculitis  - gabapentin (NEURONTIN) 300 MG capsule; TAKE 1 CAPSULE(300 MG) BY MOUTH AT BEDTIME  Dispense: 90 capsule; Refill: 1 - DULoxetine (CYMBALTA) 60 MG capsule; Take 1 capsule (60 mg total) by mouth daily.  Dispense: 90 capsule; Refill: 1 - cyclobenzaprine (FLEXERIL) 10 MG tablet; TAKE 1 TABLET BY MOUTH 3  TIMES DAILY AS NEEDED FOR  MUSCLE SPASM(S)  Dispense: 90 tablet; Refill: 1  7. Dyslipidemia  - Lipid panel - Comprehensive metabolic panel  8. History of iron deficiency  - CBC with Differential/Platelet - Iron, TIBC and Ferritin Panel  9. Screening for colorectal cancer  - Fecal occult blood, imunochemical([facility])  10. Need for Tdap vaccination  - Tdap vaccine greater than or equal to 7yo IM  11. Need for hepatitis C screening test  - Hepatitis C antibody

## 2020-02-09 LAB — COMPREHENSIVE METABOLIC PANEL
ALT: 17 IU/L (ref 0–32)
AST: 19 IU/L (ref 0–40)
Albumin/Globulin Ratio: 2 (ref 1.2–2.2)
Albumin: 4.5 g/dL (ref 3.8–4.9)
Alkaline Phosphatase: 97 IU/L (ref 48–121)
BUN/Creatinine Ratio: 21 (ref 9–23)
BUN: 15 mg/dL (ref 6–24)
Bilirubin Total: 0.5 mg/dL (ref 0.0–1.2)
CO2: 26 mmol/L (ref 20–29)
Calcium: 9.8 mg/dL (ref 8.7–10.2)
Chloride: 100 mmol/L (ref 96–106)
Creatinine, Ser: 0.71 mg/dL (ref 0.57–1.00)
GFR calc Af Amer: 113 mL/min/{1.73_m2} (ref >59)
GFR calc non Af Amer: 98 mL/min/{1.73_m2} (ref >59)
Globulin, Total: 2.3 g/dL (ref 1.5–4.5)
Glucose: 86 mg/dL (ref 65–99)
Potassium: 5 mmol/L (ref 3.5–5.2)
Sodium: 140 mmol/L (ref 134–144)
Total Protein: 6.8 g/dL (ref 6.0–8.5)

## 2020-02-09 LAB — CBC WITH DIFFERENTIAL/PLATELET
Basophils Absolute: 0 x10E3/uL (ref 0.0–0.2)
Basos: 1 %
EOS (ABSOLUTE): 0.2 x10E3/uL (ref 0.0–0.4)
Eos: 5 %
Hematocrit: 35.9 % (ref 34.0–46.6)
Hemoglobin: 11.6 g/dL (ref 11.1–15.9)
Immature Grans (Abs): 0 x10E3/uL (ref 0.0–0.1)
Immature Granulocytes: 0 %
Lymphocytes Absolute: 1.4 x10E3/uL (ref 0.7–3.1)
Lymphs: 32 %
MCH: 26.2 pg — ABNORMAL LOW (ref 26.6–33.0)
MCHC: 32.3 g/dL (ref 31.5–35.7)
MCV: 81 fL (ref 79–97)
Monocytes Absolute: 0.4 x10E3/uL (ref 0.1–0.9)
Monocytes: 8 %
Neutrophils Absolute: 2.4 x10E3/uL (ref 1.4–7.0)
Neutrophils: 54 %
Platelets: 304 x10E3/uL (ref 150–450)
RBC: 4.43 x10E6/uL (ref 3.77–5.28)
RDW: 13.5 % (ref 11.7–15.4)
WBC: 4.5 x10E3/uL (ref 3.4–10.8)

## 2020-02-09 LAB — B12 AND FOLATE PANEL
Folate: >20 ng/mL (ref >3.0)
Vitamin B-12: 494 pg/mL (ref 232–1245)

## 2020-02-09 LAB — LIPID PANEL
Chol/HDL Ratio: 2.4 ratio (ref 0.0–4.4)
Cholesterol, Total: 230 mg/dL — ABNORMAL HIGH (ref 100–199)
HDL: 97 mg/dL (ref >39)
LDL Chol Calc (NIH): 118 mg/dL — ABNORMAL HIGH (ref 0–99)
Triglycerides: 88 mg/dL (ref 0–149)
VLDL Cholesterol Cal: 15 mg/dL (ref 5–40)

## 2020-02-09 LAB — IRON,TIBC AND FERRITIN PANEL
Ferritin: 5 ng/mL — ABNORMAL LOW (ref 15–150)
Iron Saturation: 6 % — CL (ref 15–55)
Iron: 30 ug/dL (ref 27–159)
Total Iron Binding Capacity: 494 ug/dL — ABNORMAL HIGH (ref 250–450)
UIBC: 464 ug/dL — ABNORMAL HIGH (ref 131–425)

## 2020-02-09 LAB — HEMOGLOBIN A1C
Est. average glucose Bld gHb Est-mCnc: 117 mg/dL
Hgb A1c MFr Bld: 5.7 % — ABNORMAL HIGH (ref 4.8–5.6)

## 2020-02-09 LAB — HEPATITIS C ANTIBODY: Hep C Virus Ab: <0.1 {s_co_ratio} (ref 0.0–0.9)

## 2020-02-09 LAB — VITAMIN D 25 HYDROXY (VIT D DEFICIENCY, FRACTURES): Vit D, 25-Hydroxy: 45.9 ng/mL (ref 30.0–100.0)

## 2020-02-12 ENCOUNTER — Encounter: Payer: Self-pay | Admitting: Family Medicine

## 2020-02-13 ENCOUNTER — Other Ambulatory Visit: Payer: Self-pay | Admitting: Family Medicine

## 2020-02-13 DIAGNOSIS — Z1211 Encounter for screening for malignant neoplasm of colon: Secondary | ICD-10-CM

## 2020-03-22 ENCOUNTER — Other Ambulatory Visit: Payer: Self-pay

## 2020-03-22 MED ORDER — OTEZLA 30 MG PO TABS
30.0000 mg | ORAL_TABLET | Freq: Two times a day (BID) | ORAL | 0 refills | Status: DC
Start: 2020-03-22 — End: 2020-04-12

## 2020-03-22 NOTE — Progress Notes (Signed)
Otezla RF sent in and patient scheduled psoriasis follow up.

## 2020-03-26 ENCOUNTER — Encounter: Payer: Self-pay | Admitting: Family Medicine

## 2020-03-26 DIAGNOSIS — M5416 Radiculopathy, lumbar region: Secondary | ICD-10-CM

## 2020-03-27 MED ORDER — CYCLOBENZAPRINE HCL 10 MG PO TABS: ORAL_TABLET | ORAL | 0 refills | Status: DC

## 2020-04-12 ENCOUNTER — Other Ambulatory Visit: Payer: Self-pay | Admitting: Dermatology

## 2020-04-24 ENCOUNTER — Ambulatory Visit: Payer: Managed Care, Other (non HMO) | Admitting: Gastroenterology

## 2020-04-24 ENCOUNTER — Other Ambulatory Visit: Payer: Self-pay

## 2020-04-24 ENCOUNTER — Encounter: Payer: Self-pay | Admitting: Gastroenterology

## 2020-04-24 VITALS — BP 104/70 | HR 108 | Temp 98.6°F | Ht 61.0 in | Wt 175.0 lb

## 2020-04-24 DIAGNOSIS — D509 Iron deficiency anemia, unspecified: Secondary | ICD-10-CM

## 2020-04-24 DIAGNOSIS — Z1211 Encounter for screening for malignant neoplasm of colon: Secondary | ICD-10-CM

## 2020-04-24 MED ORDER — NA SULFATE-K SULFATE-MG SULF 17.5-3.13-1.6 GM/177ML PO SOLN: ORAL | 0 refills | Status: DC

## 2020-04-24 NOTE — Progress Notes (Signed)
[facility] [address]  [place]  [place], [place] [address]  Main: [phone]  Fax: [phone]   Gastroenterology Consultation  Referring Provider:     Steele Sizer, MD Primary Care Physician:  Steele Sizer, MD Reason for Consultation:    Colonoscopy for screening, iron deficiency        HPI:   Chief complaint: Iron deficiency  Ashlee Cruz is a 54 y.o. y/o female referred for consultation & management  by Dr. Steele Sizer, MD.  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  Patient takes multivitamin with iron but not iron by itself.  No blood in stool.  Has never had a colonoscopy.  No immediate family members with colon cancer.  No known weight loss, nausea or vomiting.  No dysphagia.  No altered bowel habits.  Reports a bowel movement every day or every other day that is formed.  Denies diarrhea or constipation.  Past Medical History:  Diagnosis Date  . Allergic rhinitis   . Depression   . Hyperlipidemia   . Hypertension   . Metabolic syndrome   . Obesity   . Psoriasis     Past Surgical History:  Procedure Laterality Date  . BARIATRIC SURGERY    . CHOLECYSTECTOMY    . DILATION AND CURETTAGE OF UTERUS    . LUMBAR SPINE SURGERY Left 02/2016   Pt reports lumbar surgery without placement of instrumentation to decompress nerve on left side.     Prior to Admission medications   Medication Sig Start Date End Date Taking? Authorizing Provider  acetaminophen (TYLENOL) 500 MG tablet Take 1,000 mg by mouth as needed for moderate pain.   Yes [provider]  Apremilast (OTEZLA) 30 MG TABS Take 1 tablet by mouth 2 (two) times daily. For psoriasis   Yes   BIOTIN 5000 PO Take by mouth.   Yes [provider]  cholecalciferol (VITAMIN D3) 25 MCG (1000 UT) tablet Take 2,000 Units by mouth daily.   Yes [provider]  cyclobenzaprine  (FLEXERIL) 10 MG tablet TAKE 1 TABLET BY MOUTH 3  TIMES DAILY AS NEEDED FOR  MUSCLE SPASM(S) 03/27/20  Yes Sowles, Drue Stager, MD  DULoxetine (CYMBALTA) 60 MG capsule Take 1 capsule (60 mg total) by mouth daily. 01/17/20  Yes Sowles, Drue Stager, MD  gabapentin (NEURONTIN) 300 MG capsule TAKE 1 CAPSULE(300 MG) BY MOUTH AT BEDTIME 01/17/20  Yes Sowles, Drue Stager, MD  metFORMIN (GLUCOPHAGE-XR) 500 MG 24 hr tablet Take 1 tablet (500 mg total) by mouth daily with breakfast. 01/17/20  Yes Ancil Boozer, Drue Stager, MD  Multiple Vitamin (MULTIVITAMIN) tablet    Yes [provider]  OTEZLA 30 MG TABS TAKE 1 TABLET BY MOUTH  TWICE DAILY 04/12/20  Yes Ralene Bathe, MD  Na Sulfate-K Sulfate-Mg Sulf 17.5-3.13-1.6 GM/177ML SOLN At 5 PM the day before procedure take 1 bottle and 5 hours before procedure take 1 bottle. 04/24/20   Virgel Manifold, MD    Family History  Problem Relation Age of Onset  . Diabetes Mother   . Skin cancer Mother   . Diabetes Father   . COPD Father   . Seizures Father   . Heart disease Father 87       CABG  . Diabetes Brother   . Multiple sclerosis Brother   . Stroke Paternal Grandmother   . Eczema Son      Social History  Tobacco Use  . Smoking status: Never Smoker  . Smokeless tobacco: Never Used  Vaping Use  . Vaping Use: Never used  Substance Use Topics  . Alcohol use: No    Alcohol/week: 0.0 standard drinks  . Drug use: No    Allergies as of 04/24/2020 - Review Complete 04/24/2020  Allergen Reaction Noted  . Other Other (See Comments) 10/23/2015  . Codeine  04/26/2015  . Latex  04/26/2015    Review of Systems:    All systems reviewed and negative except where noted in HPI.   Physical Exam:  BP 104/70   Pulse (!) 108   Temp 98.6 F (37 C) (Oral)   Ht 5\' 1"  (1.549 m)   LMP 10/27/2015   BMI 32.56 kg/m  Patient's last menstrual period was 10/27/2015. Psych:  Alert and cooperative. Normal mood and affect. General:   Alert,  Well-developed, well-nourished,  pleasant and cooperative in NAD Head:  Normocephalic and atraumatic. Eyes:  Sclera clear, no icterus.   Conjunctiva pink. Ears:  Normal auditory acuity. Nose:  No deformity, discharge, or lesions. Mouth:  No deformity or lesions,oropharynx pink & moist. Neck:  Supple; no masses or thyromegaly. Abdomen:  Normal bowel sounds.  No bruits.  Soft, non-tender and non-distended without masses, hepatosplenomegaly or hernias noted.  No guarding or rebound tenderness.    Msk:  Symmetrical without gross deformities. Good, equal movement & strength bilaterally. Pulses:  Normal pulses noted. Extremities:  No clubbing or edema.  No cyanosis. Neurologic:  Alert and oriented x3;  grossly normal neurologically. Skin:  Intact without significant lesions or rashes. No jaundice. Lymph Nodes:  No significant cervical adenopathy. Psych:  Alert and cooperative. Normal mood and affect.   Labs: CBC    Component Value Date/Time   WBC 4.5 02/08/2020 0932   WBC 10.2 10/29/2011 0524   WBC 10.0 06/10/2007 0325   RBC 4.43 02/08/2020 0932   RBC 4.32 10/29/2011 0524   RBC 3.81 (L) 06/10/2007 0325   HGB 11.6 02/08/2020 0932   HCT 35.9 02/08/2020 0932   PLT 304 02/08/2020 0932   MCV 81 02/08/2020 0932   MCV 91 10/29/2011 0524   MCH 26.2 (L) 02/08/2020 0932   MCH 30.7 10/29/2011 0524   MCHC 32.3 02/08/2020 0932   MCHC 33.8 10/29/2011 0524   MCHC 34.7 06/10/2007 0325   RDW 13.5 02/08/2020 0932   RDW 13.0 10/29/2011 0524   LYMPHSABS 1.4 02/08/2020 0932   LYMPHSABS 1.1 10/29/2011 0524   MONOABS 0.9 (H) 10/29/2011 0524   EOSABS 0.2 02/08/2020 0932   EOSABS 0.0 10/29/2011 0524   BASOSABS 0.0 02/08/2020 0932   BASOSABS 0.0 10/29/2011 0524   CMP     Component Value Date/Time   NA 140 02/08/2020 0932   NA 141 10/29/2011 0524   K 5.0 02/08/2020 0932   K 3.9 10/29/2011 0524   CL 100 02/08/2020 0932   CL 104 10/29/2011 0524   CO2 26 02/08/2020 0932   CO2 23 10/29/2011 0524   GLUCOSE 86 02/08/2020 0932    GLUCOSE 121 (H) 10/29/2011 0524   BUN 15 02/08/2020 0932   BUN 6 (L) 10/29/2011 0524   CREATININE 0.71 02/08/2020 0932   CREATININE 0.55 (L) 10/29/2011 0524   CALCIUM 9.8 02/08/2020 0932   CALCIUM 7.9 (L) 10/29/2011 0524   PROT 6.8 02/08/2020 0932   ALBUMIN 4.5 02/08/2020 0932   ALBUMIN 3.5 10/29/2011 0524   AST 19 02/08/2020 0932   ALT 17 02/08/2020 0932   ALKPHOS  97 02/08/2020 0932   BILITOT 0.5 02/08/2020 0932   GFRNONAA 98 02/08/2020 0932   GFRNONAA >60 10/29/2011 0524   GFRAA 113 02/08/2020 0932   GFRAA >60 10/29/2011 0524    Imaging Studies: No results found.  Assessment and Plan:   Ashlee Cruz is a 54 y.o. y/o female has been referred for iron deficiency anemia  Patient due for screening colonoscopy EGD also indicated for iron deficiency anemia  Referral to hematology due to ongoing iron deficiency anemia, patient agreeable  Iron deficiency likely due to her gastric bypass status  I have discussed alternative options, risks & benefits,  which include, but are not limited to, bleeding, infection, perforation,respiratory complication & drug reaction.  The patient agrees with this plan & written consent will be obtained.     Dr Vonda Antigua  Speech recognition software was used to dictate the above note.

## 2020-04-24 NOTE — Addendum Note (Signed)
Addended by: Wayna Chalet on: 04/24/2020 03:17 PM   Modules accepted: Orders

## 2020-04-26 ENCOUNTER — Other Ambulatory Visit: Payer: Self-pay

## 2020-04-26 ENCOUNTER — Ambulatory Visit: Payer: Managed Care, Other (non HMO) | Admitting: Dermatology

## 2020-04-26 DIAGNOSIS — L409 Psoriasis, unspecified: Secondary | ICD-10-CM

## 2020-04-26 DIAGNOSIS — L814 Other melanin hyperpigmentation: Secondary | ICD-10-CM | POA: Diagnosis not present

## 2020-04-26 DIAGNOSIS — L821 Other seborrheic keratosis: Secondary | ICD-10-CM

## 2020-04-26 DIAGNOSIS — L578 Other skin changes due to chronic exposure to nonionizing radiation: Secondary | ICD-10-CM | POA: Diagnosis not present

## 2020-04-26 NOTE — Progress Notes (Signed)
   Follow-Up Visit   Subjective  Ashlee Cruz is a 54 y.o. female who presents for the following: Psoriasis (small amount of dryness remaining on right palm of hand, treating with otezla).  The following portions of the chart were reviewed this encounter and updated as appropriate: Tobacco  Allergies  Meds  Problems  Med Hx  Surg Hx  Fam Hx     Review of Systems: No other skin or systemic complaints except as noted in HPI or Assessment and Plan.  Objective  Well appearing patient in no apparent distress; mood and affect are within normal limits.  A focused examination was performed including hands, feet, legs, and arms. Relevant physical exam findings are noted in the Assessment and Plan.  Objective  palms, soles, elbows, and knees: Pink scaliness on right hypothenar eminence.  Scaling on heels.   Elbows and knees clear.   Assessment & Plan  Psoriasis -severe of the palms soles elbows and knees causing impairment of activities of daily living.  She is improving now on systemic Otezla pills and doing much better.  No significant side effects but occasional headaches.  No depression. palms, soles, elbows, and knees Psoriasis better controlled on Otezla but persistent.  Pt reports occasional headaches, no upset stomach, and no diarrhea.   Continue Otezla 30 mg PO BID.   Continue using epionce medical barrier cream.   Use TMC as needed for flares.   Lentigines - Scattered tan macules - Discussed due to sun exposure - Benign, observe - Call for any changes  Seborrheic Keratoses - Stuck-on, waxy, tan-brown papules and plaques  - Discussed benign etiology and prognosis. - Observe - Call for any changes  Actinic Damage - diffuse scaly erythematous macules with underlying dyspigmentation - Recommend daily broad spectrum sunscreen SPF 30+ to sun-exposed areas, reapply every 2 hours as needed.  - Call for new or changing lesions.  Return in about 6 months (around  10/24/2020) for psoriasis follow up.  IHarriett Sine, CMA, am acting as scribe for Sarina Ser, MD.  Documentation: I have reviewed the above documentation for accuracy and completeness, and I agree with the above.  Sarina Ser, MD

## 2020-04-27 ENCOUNTER — Encounter: Payer: Self-pay | Admitting: Dermatology

## 2020-04-27 ENCOUNTER — Inpatient Hospital Stay: Payer: Managed Care, Other (non HMO)

## 2020-04-27 ENCOUNTER — Inpatient Hospital Stay: Payer: Managed Care, Other (non HMO) | Attending: Oncology | Admitting: Oncology

## 2020-04-27 ENCOUNTER — Encounter: Payer: Self-pay | Admitting: Oncology

## 2020-04-27 VITALS — BP 108/82 | HR 114 | Temp 98.4°F | Resp 16 | Ht 61.0 in | Wt 174.1 lb

## 2020-04-27 DIAGNOSIS — R002 Palpitations: Secondary | ICD-10-CM | POA: Diagnosis not present

## 2020-04-27 DIAGNOSIS — M25562 Pain in left knee: Secondary | ICD-10-CM | POA: Diagnosis not present

## 2020-04-27 DIAGNOSIS — E559 Vitamin D deficiency, unspecified: Secondary | ICD-10-CM | POA: Insufficient documentation

## 2020-04-27 DIAGNOSIS — I10 Essential (primary) hypertension: Secondary | ICD-10-CM | POA: Diagnosis not present

## 2020-04-27 DIAGNOSIS — E538 Deficiency of other specified B group vitamins: Secondary | ICD-10-CM | POA: Insufficient documentation

## 2020-04-27 DIAGNOSIS — Z9884 Bariatric surgery status: Secondary | ICD-10-CM | POA: Insufficient documentation

## 2020-04-27 DIAGNOSIS — D509 Iron deficiency anemia, unspecified: Secondary | ICD-10-CM | POA: Insufficient documentation

## 2020-04-27 DIAGNOSIS — D508 Other iron deficiency anemias: Secondary | ICD-10-CM | POA: Diagnosis not present

## 2020-04-27 DIAGNOSIS — E119 Type 2 diabetes mellitus without complications: Secondary | ICD-10-CM | POA: Insufficient documentation

## 2020-04-27 DIAGNOSIS — R000 Tachycardia, unspecified: Secondary | ICD-10-CM | POA: Insufficient documentation

## 2020-04-27 DIAGNOSIS — Z79899 Other long term (current) drug therapy: Secondary | ICD-10-CM | POA: Insufficient documentation

## 2020-04-27 DIAGNOSIS — M25561 Pain in right knee: Secondary | ICD-10-CM | POA: Diagnosis not present

## 2020-04-27 DIAGNOSIS — E785 Hyperlipidemia, unspecified: Secondary | ICD-10-CM | POA: Insufficient documentation

## 2020-04-27 DIAGNOSIS — F329 Major depressive disorder, single episode, unspecified: Secondary | ICD-10-CM | POA: Insufficient documentation

## 2020-04-27 DIAGNOSIS — Z7984 Long term (current) use of oral hypoglycemic drugs: Secondary | ICD-10-CM | POA: Insufficient documentation

## 2020-04-27 NOTE — Progress Notes (Signed)
Pt new pt for anemia, has had IDA in past and got feraheme and it has been several years. She did gt bariatric surgery so she only takes MVI and never taken iron pills due to her surgery

## 2020-04-30 ENCOUNTER — Encounter: Payer: Self-pay | Admitting: Oncology

## 2020-04-30 ENCOUNTER — Other Ambulatory Visit: Payer: Self-pay

## 2020-04-30 ENCOUNTER — Encounter: Payer: Self-pay | Admitting: Gastroenterology

## 2020-05-02 ENCOUNTER — Telehealth: Payer: Self-pay | Admitting: *Deleted

## 2020-05-02 DIAGNOSIS — D509 Iron deficiency anemia, unspecified: Secondary | ICD-10-CM | POA: Insufficient documentation

## 2020-05-02 NOTE — Telephone Encounter (Signed)
Per request from Dr. Janese Banks to call pt and let her know that she is not anemic but iro stores are low and she rec: to have IV iron infusion. It would be 2 doses 1 week apart. I have asked her to call me back and let me know or send me my chart message and we can make appt if pt agreeable

## 2020-05-02 NOTE — Progress Notes (Signed)
Hematology/Oncology Consult note [facility] Telephone:([phone] [phone]  Patient Care Team: Steele Sizer, MD as PCP - General (Family Medicine)   Name of the patient: Ashlee Cruz  [id number]  Dec 18, 1965    Reason for referral- anemia   Referring physician- Dr. Bonna Gains  Date of visit: 05/02/20   History of presenting illness- Patient is a 54 year old female with a past medical history significant for diabetes, depression who has been referred to [place] for anemia.  Patient was recently seen by Dr. Bonna Gains for iron deficiency.  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  Recent labs were from June 2021 which showed H&H of 11.6/35.9 with an MCV of 81.  B12 and folate were normal.  Iron studies showed a low ferritin of 5 and low iron saturation of 6% with elevated TIBC of 494.  Patient reports ongoing fatigue.  Denies any blood loss in her stool or urine.  Denies any dark melanotic stools.  Denies any consistent use of NSAIDs.  No significant family history of colon cancer.  ECOG PS- 0  Pain scale- 0   Review of systems- Review of Systems  Constitutional: Positive for malaise/fatigue. Negative for chills, fever and weight loss.  HENT: Negative for congestion, ear discharge and nosebleeds.   Eyes: Negative for blurred vision.  Respiratory: Negative for cough, hemoptysis, sputum production, shortness of breath and wheezing.   Cardiovascular: Negative for chest pain, palpitations, orthopnea and claudication.  Gastrointestinal: Negative for abdominal pain, blood in stool, constipation, diarrhea, heartburn, melena, nausea and vomiting.  Genitourinary: Negative for dysuria, flank pain, frequency, hematuria and urgency.  Musculoskeletal: Negative for back pain, joint pain and myalgias.  Skin: Negative for rash.  Neurological: Negative for dizziness, tingling, focal weakness, seizures, weakness and headaches.    Endo/Heme/Allergies: Does not bruise/bleed easily.  Psychiatric/Behavioral: Negative for depression and suicidal ideas. The patient does not have insomnia.     Allergies  Allergen Reactions  . Other Other (See Comments)    Any fragrant smell sets her off  . Latex     Pt reports prolonged exposure causes aggravation in psoriasis  . Codeine Rash    Patient Active Problem List   Diagnosis Date Noted  . History of iron deficiency 01/11/2019  . Tachycardia 05/19/2017  . Palpitations 05/19/2017  . Fibroid uterus 12/20/2015  . History of iron deficiency anemia 10/23/2015  . Left lumbar radiculitis 10/23/2015  . Absence of menstruation 04/26/2015  . B12 deficiency 04/26/2015  . Depression, major, recurrent, mild ([place]) 04/26/2015  . Dyslipidemia 04/26/2015  . Dysmetabolic syndrome 46/56/8127  . Bariatric surgery status 04/26/2015  . H/O: HTN (hypertension) 04/26/2015  . Psoriasis 04/26/2015  . Allergic rhinitis, seasonal 04/26/2015  . Vitamin D deficiency 04/26/2015  . Bilateral knee pain 04/26/2015     Past Medical History:  Diagnosis Date  . Allergic rhinitis   . Anemia   . Depression   . Family history of adverse reaction to anesthesia    Mother - PONV  . Hyperlipidemia   . Hypertension   . Metabolic syndrome   . Obesity   . PONV (postoperative nausea and vomiting)    after gallbladder surgery  . Psoriasis   . Sciatica of left side   . Vertigo    1 episode over 1 yr ago     Past Surgical History:  Procedure Laterality Date  . BARIATRIC SURGERY    . CHOLECYSTECTOMY    . DILATION AND CURETTAGE OF  UTERUS    . LUMBAR SPINE SURGERY Left 02/2016   Pt reports lumbar surgery without placement of instrumentation to decompress nerve on left side.     Social History   Socioeconomic History  . Marital status: Married    Spouse name: Lennette Bihari  . Number of children: 1  . Years of education: Not on file  . Highest education level: Associate degree: occupational,  Hotel manager, or vocational program  Occupational History  . Occupation: education/training   Tobacco Use  . Smoking status: Never Smoker  . Smokeless tobacco: Never Used  Vaping Use  . Vaping Use: Never used  Substance and Sexual Activity  . Alcohol use: No    Alcohol/week: 0.0 standard drinks  . Drug use: No  . Sexual activity: Yes    Partners: Male    Birth control/protection: None  Other Topics Concern  . Not on file  Social History Narrative  . Not on file   Social Determinants of Health   Financial Resource Strain: Low Risk   . Difficulty of Paying Living Expenses: Not hard at all  Food Insecurity: No Food Insecurity  . Worried About Charity fundraiser in the Last Year: Never true  . Ran Out of Food in the Last Year: Never true  Transportation Needs: No Transportation Needs  . Lack of Transportation (Medical): No  . Lack of Transportation (Non-Medical): No  Physical Activity: Insufficiently Active  . Days of Exercise per Week: 2 days  . Minutes of Exercise per Session: 20 min  Stress: No Stress Concern Present  . Feeling of Stress : Only a little  Social Connections: Socially Integrated  . Frequency of Communication with Friends and Family: More than three times a week  . Frequency of Social Gatherings with Friends and Family: More than three times a week  . Attends Religious Services: More than 4 times per year  . Active Member of Clubs or Organizations: Yes  . Attends Archivist Meetings: More than 4 times per year  . Marital Status: Married  Human resources officer Violence: Not At Risk  . Fear of Current or Ex-Partner: No  . Emotionally Abused: No  . Physically Abused: No  . Sexually Abused: No     Family History  Problem Relation Age of Onset  . Diabetes Mother   . Skin cancer Mother   . Diabetes Father   . COPD Father   . Seizures Father   . Heart disease Father 50       CABG  . Diabetes Brother   . Multiple sclerosis Brother   . Stroke  Paternal Grandmother      Current Outpatient Medications:  .  acetaminophen (TYLENOL) 500 MG tablet, Take 1,000 mg by mouth as needed for moderate pain., Disp: , Rfl:  .  BIOTIN 5000 PO, Take 1 Dose by mouth daily. , Disp: , Rfl:  .  cholecalciferol (VITAMIN D3) 25 MCG (1000 UT) tablet, Take 2,000 Units by mouth daily., Disp: , Rfl:  .  cyclobenzaprine (FLEXERIL) 10 MG tablet, TAKE 1 TABLET BY MOUTH 3  TIMES DAILY AS NEEDED FOR  MUSCLE SPASM(S), Disp: 90 tablet, Rfl: 0 .  DULoxetine (CYMBALTA) 60 MG capsule, Take 1 capsule (60 mg total) by mouth daily., Disp: 90 capsule, Rfl: 1 .  gabapentin (NEURONTIN) 300 MG capsule, TAKE 1 CAPSULE(300 MG) BY MOUTH AT BEDTIME, Disp: 90 capsule, Rfl: 1 .  metFORMIN (GLUCOPHAGE-XR) 500 MG 24 hr tablet, Take 1 tablet (500 mg total) by mouth  daily with breakfast., Disp: 90 tablet, Rfl: 1 .  Multiple Vitamin (MULTIVITAMIN) tablet, Take 1 tablet by mouth daily. , Disp: , Rfl:  .  OTEZLA 30 MG TABS, TAKE 1 TABLET BY MOUTH  TWICE DAILY, Disp: 60 tablet, Rfl: 0 .  Na Sulfate-K Sulfate-Mg Sulf 17.5-3.13-1.6 GM/177ML SOLN, At 5 PM the day before procedure take 1 bottle and 5 hours before procedure take 1 bottle. (Patient not taking: Reported on 04/27/2020), Disp: 354 mL, Rfl: 0   Physical exam:  Vitals:   04/27/20 1100  BP: 108/82  Pulse: (!) 114  Resp: 16  Temp: 98.4 F (36.9 C)  TempSrc: Oral  Weight: 174 lb 1.6 oz (79 kg)  Height: 5\' 1"  (1.549 m)   Physical Exam Constitutional:      General: She is not in acute distress. Cardiovascular:     Rate and Rhythm: Normal rate and regular rhythm.     Heart sounds: Normal heart sounds.  Pulmonary:     Effort: Pulmonary effort is normal.     Breath sounds: Normal breath sounds.  Abdominal:     General: Bowel sounds are normal.     Palpations: Abdomen is soft.  Skin:    General: Skin is warm and dry.  Neurological:     Mental Status: She is alert and oriented to person, place, and time.        CMP  Latest Ref Rng & Units 02/08/2020  Glucose 65 - 99 mg/dL 86  BUN 6 - 24 mg/dL 15  Creatinine 0.57 - 1.00 mg/dL 0.71  Sodium 134 - 144 mmol/L 140  Potassium 3.5 - 5.2 mmol/L 5.0  Chloride 96 - 106 mmol/L 100  CO2 20 - 29 mmol/L 26  Calcium 8.7 - 10.2 mg/dL 9.8  Total Protein 6.0 - 8.5 g/dL 6.8  Total Bilirubin 0.0 - 1.2 mg/dL 0.5  Alkaline Phos 48 - 121 IU/L 97  AST 0 - 40 IU/L 19  ALT 0 - 32 IU/L 17   CBC Latest Ref Rng & Units 02/08/2020  WBC 3.4 - 10.8 x10E3/uL 4.5  Hemoglobin 11.1 - 15.9 g/dL 11.6  Hematocrit 34.0 - 46.6 % 35.9  Platelets 150 - 450 x10E3/uL 304     Assessment and plan- Patient is a 54 y.o. female Referred for iron deficiency anemia   We do not have any recent labs for patient other than the one from June 2021.  I will therefore repeat CBC ferritin and iron studies B12 and folate today.  If she has evidence of iron deficiency we will schedule her for IV iron given her history of gastric bypass.  Discussed with patient that she can either receive Feraheme or Venofer depending on what her insurance would approve.  Infusion typically last for about 30 minutes.  Feraheme would need 2 doses whereas Venofer would be 5 doses.  Discussed possible risks of IV iron including all but not limited to headache, nausea and risk of infusion reaction.  Patient understands and agrees to proceed as planned.  I will see her back in 2 months with CBC ferritin and iron studies   Thank you for this kind referral and the opportunity to participate in the care of this  Patient   Visit Diagnosis 1. Iron deficiency anemia, unspecified iron deficiency anemia type     Dr. Randa Evens, MD, MPH [facility] [phone] 05/02/2020

## 2020-05-02 NOTE — Telephone Encounter (Signed)
I called pt and left message on recorder about pt does need iron infusion and we can set them up if she is ok with that, she called back and said that she is good with feraheme and her dates available is 9/22, 9/23 , 9/24 then following week 9/30 and 10/1. She does have a dental appt 9/30 in am but could come in afternoon. I have sent message to scheduler and she will call pt with appts tom

## 2020-05-03 ENCOUNTER — Other Ambulatory Visit
Admission: RE | Admit: 2020-05-03 | Discharge: 2020-05-03 | Disposition: A | Discharge status: Home / Self Care | Payer: Managed Care, Other (non HMO) | Source: Ambulatory Visit | Attending: Gastroenterology | Admitting: Gastroenterology

## 2020-05-03 ENCOUNTER — Other Ambulatory Visit: Payer: Self-pay

## 2020-05-03 DIAGNOSIS — Z01812 Encounter for preprocedural laboratory examination: Secondary | ICD-10-CM | POA: Diagnosis present

## 2020-05-03 DIAGNOSIS — Z20822 Contact with and (suspected) exposure to covid-19: Secondary | ICD-10-CM | POA: Insufficient documentation

## 2020-05-03 NOTE — Discharge Instructions (Signed)
General Anesthesia, Adult, Care After This sheet gives you information about how to care for yourself after your procedure. Your health care provider may also give you more specific instructions. If you have problems or questions, contact your health care provider. What can I expect after the procedure? After the procedure, the following side effects are common:  Pain or discomfort at the IV site.  Nausea.  Vomiting.  Sore throat.  Trouble concentrating.  Feeling cold or chills.  Weak or tired.  Sleepiness and fatigue.  Soreness and body aches. These side effects can affect parts of the body that were not involved in surgery. Follow these instructions at home:  For at least 24 hours after the procedure:  Have a responsible adult stay with you. It is important to have someone help care for you until you are awake and alert.  Rest as needed.  Do not: ? Participate in activities in which you could fall or become injured. ? Drive. ? Use heavy machinery. ? Drink alcohol. ? Take sleeping pills or medicines that cause drowsiness. ? Make important decisions or sign legal documents. ? Take care of children on your own. Eating and drinking  Follow any instructions from your health care provider about eating or drinking restrictions.  When you feel hungry, start by eating small amounts of foods that are soft and easy to digest (bland), such as toast. Gradually return to your regular diet.  Drink enough fluid to keep your urine pale yellow.  If you vomit, rehydrate by drinking water, juice, or clear broth. General instructions  If you have sleep apnea, surgery and certain medicines can increase your risk for breathing problems. Follow instructions from your health care provider about wearing your sleep device: ? Anytime you are sleeping, including during daytime naps. ? While taking prescription pain medicines, sleeping medicines, or medicines that make you drowsy.  Return to  your normal activities as told by your health care provider. Ask your health care provider what activities are safe for you.  Take over-the-counter and prescription medicines only as told by your health care provider.  If you smoke, do not smoke without supervision.  Keep all follow-up visits as told by your health care provider. This is important. Contact a health care provider if:  You have nausea or vomiting that does not get better with medicine.  You cannot eat or drink without vomiting.  You have pain that does not get better with medicine.  You are unable to pass urine.  You develop a skin rash.  You have a fever.  You have redness around your IV site that gets worse. Get help right away if:  You have difficulty breathing.  You have chest pain.  You have blood in your urine or stool, or you vomit blood. Summary  After the procedure, it is common to have a sore throat or nausea. It is also common to feel tired.  Have a responsible adult stay with you for the first 24 hours after general anesthesia. It is important to have someone help care for you until you are awake and alert.  When you feel hungry, start by eating small amounts of foods that are soft and easy to digest (bland), such as toast. Gradually return to your regular diet.  Drink enough fluid to keep your urine pale yellow.  Return to your normal activities as told by your health care provider. Ask your health care provider what activities are safe for you. This information is not   intended to replace advice given to you by your health care provider. Make sure you discuss any questions you have with your health care provider. Document Revised: 08/07/2017 Document Reviewed: 03/20/2017 Elsevier Patient Education  2020 Elsevier Inc[employer]  

## 2020-05-04 LAB — SARS CORONAVIRUS 2 (TAT 6-24 HRS): SARS Coronavirus 2: NEGATIVE

## 2020-05-07 ENCOUNTER — Ambulatory Visit: Payer: Managed Care, Other (non HMO) | Admitting: Anesthesiology

## 2020-05-07 ENCOUNTER — Encounter
Admission: RE | Disposition: A | Discharge status: Home / Self Care | Payer: Self-pay | Source: Home / Self Care | Attending: Gastroenterology

## 2020-05-07 ENCOUNTER — Ambulatory Visit
Admission: RE | Admit: 2020-05-07 | Discharge: 2020-05-07 | Disposition: A | Discharge status: Home / Self Care | Payer: Managed Care, Other (non HMO) | Attending: Gastroenterology | Admitting: Gastroenterology

## 2020-05-07 ENCOUNTER — Encounter: Payer: Self-pay | Admitting: Gastroenterology

## 2020-05-07 ENCOUNTER — Other Ambulatory Visit: Payer: Self-pay

## 2020-05-07 DIAGNOSIS — F329 Major depressive disorder, single episode, unspecified: Secondary | ICD-10-CM | POA: Diagnosis not present

## 2020-05-07 DIAGNOSIS — I10 Essential (primary) hypertension: Secondary | ICD-10-CM | POA: Insufficient documentation

## 2020-05-07 DIAGNOSIS — K449 Diaphragmatic hernia without obstruction or gangrene: Secondary | ICD-10-CM | POA: Insufficient documentation

## 2020-05-07 DIAGNOSIS — D509 Iron deficiency anemia, unspecified: Secondary | ICD-10-CM | POA: Insufficient documentation

## 2020-05-07 DIAGNOSIS — Z7984 Long term (current) use of oral hypoglycemic drugs: Secondary | ICD-10-CM | POA: Insufficient documentation

## 2020-05-07 DIAGNOSIS — Z885 Allergy status to narcotic agent status: Secondary | ICD-10-CM | POA: Diagnosis not present

## 2020-05-07 DIAGNOSIS — K317 Polyp of stomach and duodenum: Secondary | ICD-10-CM | POA: Insufficient documentation

## 2020-05-07 DIAGNOSIS — Z79899 Other long term (current) drug therapy: Secondary | ICD-10-CM | POA: Diagnosis not present

## 2020-05-07 DIAGNOSIS — Z980 Intestinal bypass and anastomosis status: Secondary | ICD-10-CM | POA: Diagnosis not present

## 2020-05-07 DIAGNOSIS — Z1211 Encounter for screening for malignant neoplasm of colon: Secondary | ICD-10-CM | POA: Insufficient documentation

## 2020-05-07 DIAGNOSIS — K5730 Diverticulosis of large intestine without perforation or abscess without bleeding: Secondary | ICD-10-CM | POA: Insufficient documentation

## 2020-05-07 DIAGNOSIS — Z91040 Latex allergy status: Secondary | ICD-10-CM | POA: Diagnosis not present

## 2020-05-07 HISTORY — PX: ESOPHAGOGASTRODUODENOSCOPY (EGD) WITH PROPOFOL: SHX5813

## 2020-05-07 HISTORY — DX: Sciatica, left side: M54.32

## 2020-05-07 HISTORY — DX: Nausea with vomiting, unspecified: R11.2

## 2020-05-07 HISTORY — DX: Family history of other specified conditions: Z84.89

## 2020-05-07 HISTORY — DX: Dizziness and giddiness: R42

## 2020-05-07 HISTORY — PX: COLONOSCOPY WITH PROPOFOL: SHX5780

## 2020-05-07 HISTORY — DX: Other specified postprocedural states: Z98.890

## 2020-05-07 LAB — GLUCOSE, CAPILLARY
Glucose-Capillary: 109 mg/dL — ABNORMAL HIGH (ref 70–99)
Glucose-Capillary: 92 mg/dL (ref 70–99)

## 2020-05-07 SURGERY — ESOPHAGOGASTRODUODENOSCOPY (EGD) WITH PROPOFOL
Anesthesia: General | Site: Rectum

## 2020-05-07 MED ORDER — STERILE WATER FOR IRRIGATION IR SOLN: Status: DC | PRN

## 2020-05-07 MED ORDER — LIDOCAINE HCL (CARDIAC) PF 100 MG/5ML IV SOSY
PREFILLED_SYRINGE | INTRAVENOUS | Status: DC | PRN
  Administered 2020-05-07: 50 mg via INTRAVENOUS

## 2020-05-07 MED ORDER — SODIUM CHLORIDE 0.9 % IV SOLN: INTRAVENOUS | Status: DC

## 2020-05-07 MED ORDER — GLYCOPYRROLATE 0.2 MG/ML IJ SOLN
INTRAMUSCULAR | Status: DC | PRN
  Administered 2020-05-07: .1 mg via INTRAVENOUS

## 2020-05-07 MED ORDER — PROPOFOL 10 MG/ML IV BOLUS
INTRAVENOUS | Status: DC | PRN
  Administered 2020-05-07 (×5): 20 mg via INTRAVENOUS
  Administered 2020-05-07: 30 mg via INTRAVENOUS
  Administered 2020-05-07: 20 mg via INTRAVENOUS
  Administered 2020-05-07: 30 mg via INTRAVENOUS
  Administered 2020-05-07 (×3): 20 mg via INTRAVENOUS
  Administered 2020-05-07: 80 mg via INTRAVENOUS
  Administered 2020-05-07: 20 mg via INTRAVENOUS
  Administered 2020-05-07: 30 mg via INTRAVENOUS
  Administered 2020-05-07 (×3): 20 mg via INTRAVENOUS

## 2020-05-07 MED ORDER — LACTATED RINGERS IV SOLN: INTRAVENOUS | Status: DC

## 2020-05-07 SURGICAL SUPPLY — 7 items
BLOCK BITE 60FR ADLT L/F GRN (MISCELLANEOUS) ×3 IMPLANT
FORCEPS BIOP RAD 4 LRG CAP 4 (CUTTING FORCEPS) ×3 IMPLANT
GOWN CVR UNV OPN BCK APRN NK (MISCELLANEOUS) ×4 IMPLANT
GOWN ISOL THUMB LOOP REG UNIV (MISCELLANEOUS) ×6
KIT ENDO PROCEDURE OLY (KITS) ×3 IMPLANT
MANIFOLD NEPTUNE II (INSTRUMENTS) ×3 IMPLANT
WATER STERILE IRR 250ML POUR (IV SOLUTION) ×3 IMPLANT

## 2020-05-07 NOTE — Anesthesia Procedure Notes (Signed)
Performed by: Byren Pankow, CRNA Pre-anesthesia Checklist: Patient identified, Emergency Drugs available, Suction available, Timeout performed and Patient being monitored Patient Re-evaluated:Patient Re-evaluated prior to induction Oxygen Delivery Method: Nasal cannula Placement Confirmation: positive ETCO2       

## 2020-05-07 NOTE — H&P (Signed)
Ashlee Antigua, MD [address], [place], [place], [place], [id number], [place], [place], [place], [address] Phone: [phone]  Fax: [phone]  Primary Care Physician:  Steele Sizer, MD   Pre-Procedure History & Physical: HPI:  Ashlee Cruz is a 54 y.o. female is here for a colonoscopy and EGD.   Past Medical History:  Diagnosis Date  . Allergic rhinitis   . Anemia   . Depression   . Family history of adverse reaction to anesthesia    Mother - PONV  . Hyperlipidemia   . Hypertension   . Metabolic syndrome   . Obesity   . PONV (postoperative nausea and vomiting)    after gallbladder surgery  . Psoriasis   . Sciatica of left side   . Vertigo    1 episode over 1 yr ago    Past Surgical History:  Procedure Laterality Date  . BARIATRIC SURGERY    . CHOLECYSTECTOMY    . DILATION AND CURETTAGE OF UTERUS    . LUMBAR SPINE SURGERY Left 02/2016   Pt reports lumbar surgery without placement of instrumentation to decompress nerve on left side.     Prior to Admission medications   Medication Sig Start Date End Date Taking? Authorizing Provider  acetaminophen (TYLENOL) 500 MG tablet Take 1,000 mg by mouth as needed for moderate pain.   Yes [provider]  BIOTIN 5000 PO Take 1 Dose by mouth daily.    Yes [provider]  cholecalciferol (VITAMIN D3) 25 MCG (1000 UT) tablet Take 2,000 Units by mouth daily.   Yes [provider]  cyclobenzaprine (FLEXERIL) 10 MG tablet TAKE 1 TABLET BY MOUTH 3  TIMES DAILY AS NEEDED FOR  MUSCLE SPASM(S) 03/27/20  Yes Sowles, Drue Stager, MD  DULoxetine (CYMBALTA) 60 MG capsule Take 1 capsule (60 mg total) by mouth daily. 01/17/20  Yes Sowles, Drue Stager, MD  gabapentin (NEURONTIN) 300 MG capsule TAKE 1 CAPSULE(300 MG) BY MOUTH AT BEDTIME 01/17/20  Yes Sowles, Drue Stager, MD  metFORMIN (GLUCOPHAGE-XR) 500 MG 24 hr tablet Take 1 tablet (500 mg total) by mouth daily with breakfast. 01/17/20  Yes Sowles, Drue Stager, MD   Multiple Vitamin (MULTIVITAMIN) tablet Take 1 tablet by mouth daily.    Yes [provider]  Na Sulfate-K Sulfate-Mg Sulf 17.5-3.13-1.6 GM/177ML SOLN At 5 PM the day before procedure take 1 bottle and 5 hours before procedure take 1 bottle. 04/24/20  Yes Virgel Manifold, MD  OTEZLA 30 MG TABS TAKE 1 TABLET BY MOUTH  TWICE DAILY 04/12/20  Yes Ralene Bathe, MD    Allergies as of 04/24/2020 - Review Complete 04/24/2020  Allergen Reaction Noted  . Other Other (See Comments) 10/23/2015  . Codeine  04/26/2015  . Latex  04/26/2015    Family History  Problem Relation Age of Onset  . Diabetes Mother   . Skin cancer Mother   . Diabetes Father   . COPD Father   . Seizures Father   . Heart disease Father 66       CABG  . Diabetes Brother   . Multiple sclerosis Brother   . Stroke Paternal Grandmother     Social History   Socioeconomic History  . Marital status: Married    Spouse name: Lennette Bihari  . Number of children: 1  . Years of education: Not on file  . Highest education level: Associate degree: occupational, Hotel manager, or vocational program  Occupational History  . Occupation: education/training   Tobacco Use  .  Smoking status: Never Smoker  . Smokeless tobacco: Never Used  Vaping Use  . Vaping Use: Never used  Substance and Sexual Activity  . Alcohol use: No    Alcohol/week: 0.0 standard drinks  . Drug use: No  . Sexual activity: Yes    Partners: Male    Birth control/protection: None  Other Topics Concern  . Not on file  Social History Narrative  . Not on file   Social Determinants of Health   Financial Resource Strain: Low Risk   . Difficulty of Paying Living Expenses: Not hard at all  Food Insecurity: No Food Insecurity  . Worried About Charity fundraiser in the Last Year: Never true  . Ran Out of Food in the Last Year: Never true  Transportation Needs: No Transportation Needs  . Lack of Transportation (Medical): No  . Lack of Transportation  (Non-Medical): No  Physical Activity: Insufficiently Active  . Days of Exercise per Week: 2 days  . Minutes of Exercise per Session: 20 min  Stress: No Stress Concern Present  . Feeling of Stress : Only a little  Social Connections: Socially Integrated  . Frequency of Communication with Friends and Family: More than three times a week  . Frequency of Social Gatherings with Friends and Family: More than three times a week  . Attends Religious Services: More than 4 times per year  . Active Member of Clubs or Organizations: Yes  . Attends Archivist Meetings: More than 4 times per year  . Marital Status: Married  Human resources officer Violence: Not At Risk  . Fear of Current or Ex-Partner: No  . Emotionally Abused: No  . Physically Abused: No  . Sexually Abused: No    Review of Systems: See HPI, otherwise negative ROS  Physical Exam: BP 127/89   Pulse 100   Temp 97.8 F (36.6 C) (Temporal)   Resp 16   Ht 5\' 1"  (1.549 m)   Wt 78.9 kg   LMP 10/27/2015   SpO2 97%   BMI 32.87 kg/m  General:   Alert,  pleasant and cooperative in NAD Head:  Normocephalic and atraumatic. Neck:  Supple; no masses or thyromegaly. Lungs:  Clear throughout to auscultation, normal respiratory effort.    Heart:  +S1, +S2, Regular rate and rhythm, No edema. Abdomen:  Soft, nontender and nondistended. Normal bowel sounds, without guarding, and without rebound.   Neurologic:  Alert and  oriented x4;  grossly normal neurologically.  Impression/Plan: Ashlee Cruz is here for a colonoscopy to be performed for average risk screening and EGD for iron deficiency anemia.  Risks, benefits, limitations, and alternatives regarding the procedures have been reviewed with the patient.  Questions have been answered.  All parties agreeable.   Virgel Manifold, MD  05/07/2020, 12:30 PM

## 2020-05-07 NOTE — Anesthesia Preprocedure Evaluation (Signed)
Anesthesia Evaluation  Patient identified by MRN, date of birth, ID band Patient awake    Reviewed: Allergy & Precautions, H&P , NPO status , Patient's Chart, lab work & pertinent test results, reviewed documented beta blocker date and time   History of Anesthesia Complications (+) PONV  Airway Mallampati: II  TM Distance: >3 FB Neck ROM: full    Dental no notable dental hx.    Pulmonary neg pulmonary ROS,    Pulmonary exam normal breath sounds clear to auscultation       Cardiovascular Exercise Tolerance: Good hypertension,  Rhythm:regular Rate:Normal     Neuro/Psych Depression negative neurological ROS     GI/Hepatic negative GI ROS, Neg liver ROS,   Endo/Other  negative endocrine ROS  Renal/GU negative Renal ROS  negative genitourinary   Musculoskeletal   Abdominal   Peds  Hematology  (+) Blood dyscrasia, anemia ,   Anesthesia Other Findings   Reproductive/Obstetrics negative OB ROS                             Anesthesia Physical Anesthesia Plan  ASA: II  Anesthesia Plan: General   Post-op Pain Management:    Induction:   PONV Risk Score and Plan: 4 or greater and Propofol infusion, TIVA and Treatment may vary due to age or medical condition  Airway Management Planned:   Additional Equipment:   Intra-op Plan:   Post-operative Plan:   Informed Consent: I have reviewed the patients History and Physical, chart, labs and discussed the procedure including the risks, benefits and alternatives for the proposed anesthesia with the patient or authorized representative who has indicated his/her understanding and acceptance.     Dental Advisory Given  Plan Discussed with: CRNA  Anesthesia Plan Comments:         Anesthesia Quick Evaluation

## 2020-05-07 NOTE — Op Note (Addendum)
[facility] Gastroenterology Patient Name: Ashlee Cruz Procedure Date: 05/07/2020 1:16 PM MRN: [id number] Account #: [id number] Date of Birth: 07-27-1966 Admit Type: Outpatient Age: 54 Room: [facility] OR ROOM 01 Gender: Female Note Status: Supervisor Override Procedure:             Upper GI endoscopy Indications:           Iron deficiency anemia Providers:             Luisdaniel Kenton B. Bonna Gains MD, MD Referring MD:          Bethena Roys. Sowles, MD (Referring MD) Medicines:             Monitored Anesthesia Care Complications:         No immediate complications. Procedure:             Pre-Anesthesia Assessment:                        - The risks and benefits of the procedure and the                         sedation options and risks were discussed with the                         patient. All questions were answered and informed                         consent was obtained.                        - Patient identification and proposed procedure were                         verified prior to the procedure.                        - ASA Grade Assessment: II - A patient with mild                         systemic disease.                        After obtaining informed consent, the endoscope was                         passed under direct vision. Throughout the procedure,                         the patient's blood pressure, pulse, and oxygen                         saturations were monitored continuously. The was                         introduced through the mouth, and advanced to the                         jejunum. The upper GI endoscopy was accomplished with                         ease. The  patient tolerated the procedure well. Findings:      The examined esophagus was normal.      A small hiatal hernia was present.      Evidence of a Roux-en-Y gastrojejunostomy was found. The gastrojejunal       anastomosis was characterized by healthy appearing mucosa.      A few 3 to 6 mm  sessile polyps with no stigmata of recent bleeding were       found in the gastric fundus. Biopsies were taken with a cold forceps for       histology.      The exam of the stomach was otherwise normal.      The examined jejunum was normal. Impression:            - Normal esophagus.                        - Small hiatal hernia.                        - Roux-en-Y gastrojejunostomy with gastrojejunal                         anastomosis characterized by healthy appearing mucosa.                        - A few gastric polyps. Biopsied.                        - Normal examined jejunum. Recommendation:        - Await pathology results.                        - Continue present medications.                        - Return to my office as previously scheduled.                        - The findings and recommendations were discussed with                         the patient.                        - The findings and recommendations were discussed with                         the patient's family. Procedure Code(s):     --- Professional ---                        [phone], Esophagogastroduodenoscopy, flexible,                         transoral; with biopsy, single or multiple Diagnosis Code(s):     --- Professional ---                        K44.9, Diaphragmatic hernia without obstruction or                         gangrene  Z98.0, Intestinal bypass and anastomosis status                        K31.7, Polyp of stomach and duodenum                        D50.9, Iron deficiency anemia, unspecified CPT copyright 2019 American Medical Association. All rights reserved. The codes documented in this report are preliminary and upon coder review may  be revised to meet current compliance requirements.  Vonda Antigua, MD Margretta Sidle B. Bonna Gains MD, MD 05/07/2020 1:37:06 PM This report has been signed electronically. Number of Addenda: 0 Note Initiated On: 05/07/2020 1:16 PM Scope  Withdrawal Time: 0 hours 15 minutes 33 seconds  Total Procedure Duration: 0 hours 23 minutes 45 seconds  Estimated Blood Loss:  Estimated blood loss: none.      [facility]

## 2020-05-07 NOTE — Anesthesia Postprocedure Evaluation (Signed)
Anesthesia Post Note  Patient: KIAH VANALSTINE  Procedure(s) Performed: ESOPHAGOGASTRODUODENOSCOPY (EGD) WITH BIOPSY (N/A Mouth) COLONOSCOPY WITH PROPOFOL (N/A Rectum)     Patient location during evaluation: PACU Anesthesia Type: General Level of consciousness: awake and alert Pain management: pain level controlled Vital Signs Assessment: post-procedure vital signs reviewed and stable Respiratory status: spontaneous breathing, nonlabored ventilation, respiratory function stable and patient connected to nasal cannula oxygen Cardiovascular status: blood pressure returned to baseline and stable Postop Assessment: no apparent nausea or vomiting Anesthetic complications: no   No complications documented.  Alisa Graff

## 2020-05-07 NOTE — Op Note (Addendum)
[facility] Gastroenterology Patient Name: Ashlee Cruz Procedure Date: 05/07/2020 1:15 PM MRN: [id number] Account #: [id number] Date of Birth: 02/24/1966 Admit Type: Outpatient Age: 54 Room: [facility] OR ROOM 01 Gender: Female Note Status: Supervisor Override Procedure:             Colonoscopy Indications:           Screening for colorectal malignant neoplasm Providers:             Juliana Boling B. Bonna Gains MD, MD Medicines:             Monitored Anesthesia Care Complications:         No immediate complications. Procedure:             Pre-Anesthesia Assessment:                        - Prior to the procedure, a History and Physical was                         performed, and patient medications, allergies and                         sensitivities were reviewed. The patient's tolerance                         of previous anesthesia was reviewed.                        - The risks and benefits of the procedure and the                         sedation options and risks were discussed with the                         patient. All questions were answered and informed                         consent was obtained.                        - Patient identification and proposed procedure were                         verified prior to the procedure by the physician, the                         nurse, the anesthetist and the technician. The                         procedure was verified in the pre-procedure area in                         the procedure room in the endoscopy suite.                        - ASA Grade Assessment: II - A patient with mild                         systemic disease.                        -  After reviewing the risks and benefits, the patient                         was deemed in satisfactory condition to undergo the                         procedure.                        After obtaining informed consent, the colonoscope was                         passed  under direct vision. Throughout the procedure,                         the patient's blood pressure, pulse, and oxygen                         saturations were monitored continuously. The                         Colonoscope was introduced through the anus and                         advanced to the the cecum, identified by appendiceal                         orifice and ileocecal valve. The colonoscopy was                         performed with ease. The patient tolerated the                         procedure well. The quality of the bowel preparation                         was good. Findings:      The perianal and digital rectal examinations were normal.      A few diverticula were found in the entire colon.      The exam was otherwise without abnormality.      The rectum, sigmoid colon, descending colon, transverse colon, ascending       colon and cecum appeared normal.      The retroflexed view of the distal rectum and anal verge was normal and       showed no anal or rectal abnormalities. Impression:            - Diverticulosis in the entire examined colon.                        - The examination was otherwise normal.                        - The rectum, sigmoid colon, descending colon,                         transverse colon, ascending colon and cecum are normal.                        - The distal  rectum and anal verge are normal on                         retroflexion view.                        - No specimens collected. Recommendation:        - Discharge patient to home.                        - Resume previous diet.                        - Continue present medications.                        - Repeat colonoscopy in 10 years for screening                         purposes.                        - Return to primary care physician as previously                         scheduled.                        - The findings and recommendations were discussed with                          the patient.                        - The findings and recommendations were discussed with                         the patient's family.                        - High fiber diet.                        - In the future, if patient develops new symptoms such                         as blood per rectum, abdominal pain, weight loss,                         altered bowel habits or any other reason for concern,                         patient should discuss this with thier PCP as they may                         need a GI referral at that time or evaluation for need                         for colonoscopy earlier than the recommended screening  colonoscopy.                        In addition, if patient's family history of colon                         cancer changes (no family history at this time) in the                         future, earlier screening may be indicated and patient                         should discuss this with PCP as well. Procedure Code(s):     --- Professional ---                        [phone], Colonoscopy, flexible; diagnostic, including                         collection of specimen(s) by brushing or washing, when                         performed (separate procedure) Diagnosis Code(s):     --- Professional ---                        Z12.11, Encounter for screening for malignant neoplasm                         of colon CPT copyright 2019 American Medical Association. All rights reserved. The codes documented in this report are preliminary and upon coder review may  be revised to meet current compliance requirements.  Vonda Antigua, MD Margretta Sidle B. Bonna Gains MD, MD 05/07/2020 2:12:16 PM This report has been signed electronically. Number of Addenda: 0 Note Initiated On: 05/07/2020 1:15 PM      [facility]

## 2020-05-07 NOTE — Transfer of Care (Signed)
Immediate Anesthesia Transfer of Care Note  Patient: Ashlee Cruz  Procedure(s) Performed: ESOPHAGOGASTRODUODENOSCOPY (EGD) WITH BIOPSY (N/A Mouth) COLONOSCOPY WITH PROPOFOL (N/A Rectum)  Patient Location: PACU  Anesthesia Type: General  Level of Consciousness: awake, alert  and patient cooperative  Airway and Oxygen Therapy: Patient Spontanous Breathing and Patient connected to supplemental oxygen  Post-op Assessment: Post-op Vital signs reviewed, Patient's Cardiovascular Status Stable, Respiratory Function Stable, Patent Airway and No signs of Nausea or vomiting  Post-op Vital Signs: Reviewed and stable  Complications: No complications documented.

## 2020-05-08 ENCOUNTER — Telehealth: Payer: Self-pay | Admitting: Gastroenterology

## 2020-05-08 ENCOUNTER — Encounter: Payer: Self-pay | Admitting: Gastroenterology

## 2020-05-08 NOTE — Telephone Encounter (Signed)
Patient called about getting a sooner follow up appointment. Per Patient. Clinical staff was informed.

## 2020-05-08 NOTE — Telephone Encounter (Signed)
Called patient back but did not answer. However, I sent patient a MyChart message letting her know that she is currently needing to follow up with Dr. Janese Banks in reference to her iron deficiency anemia. I also explained that we are waiting for pathology results and see if she needs a follow up appointment with Dr. Bonna Gains or not at this time. I told her to call us if she had further questions.

## 2020-05-09 LAB — SURGICAL PATHOLOGY

## 2020-05-10 ENCOUNTER — Inpatient Hospital Stay: Payer: Managed Care, Other (non HMO) | Attending: Oncology

## 2020-05-10 ENCOUNTER — Other Ambulatory Visit: Payer: Self-pay

## 2020-05-10 VITALS — BP 106/76 | HR 82 | Temp 96.3°F | Resp 20

## 2020-05-10 DIAGNOSIS — D509 Iron deficiency anemia, unspecified: Secondary | ICD-10-CM | POA: Diagnosis not present

## 2020-05-10 DIAGNOSIS — Z79899 Other long term (current) drug therapy: Secondary | ICD-10-CM | POA: Diagnosis not present

## 2020-05-10 DIAGNOSIS — D508 Other iron deficiency anemias: Secondary | ICD-10-CM

## 2020-05-10 MED ORDER — SODIUM CHLORIDE 0.9 % IV SOLN
Freq: Once | INTRAVENOUS | Status: AC
  Filled 2020-05-10: qty 250

## 2020-05-10 MED ORDER — SODIUM CHLORIDE 0.9 % IV SOLN
510.0000 mg | Freq: Once | INTRAVENOUS | Status: AC
  Administered 2020-05-10: 510 mg via INTRAVENOUS
  Filled 2020-05-10: qty 510

## 2020-05-15 ENCOUNTER — Other Ambulatory Visit: Payer: Self-pay | Admitting: Dermatology

## 2020-05-17 ENCOUNTER — Inpatient Hospital Stay: Payer: Managed Care, Other (non HMO)

## 2020-05-17 ENCOUNTER — Other Ambulatory Visit: Payer: Self-pay

## 2020-05-17 VITALS — BP 110/81 | HR 97 | Temp 97.8°F

## 2020-05-17 DIAGNOSIS — D509 Iron deficiency anemia, unspecified: Secondary | ICD-10-CM | POA: Diagnosis not present

## 2020-05-17 DIAGNOSIS — D508 Other iron deficiency anemias: Secondary | ICD-10-CM

## 2020-05-17 MED ORDER — SODIUM CHLORIDE 0.9 % IV SOLN
510.0000 mg | Freq: Once | INTRAVENOUS | Status: AC
  Administered 2020-05-17: 510 mg via INTRAVENOUS
  Filled 2020-05-17: qty 510

## 2020-05-17 MED ORDER — SODIUM CHLORIDE 0.9 % IV SOLN
Freq: Once | INTRAVENOUS | Status: AC
  Filled 2020-05-17: qty 250

## 2020-06-07 ENCOUNTER — Other Ambulatory Visit: Payer: Self-pay | Admitting: Dermatology

## 2020-06-09 ENCOUNTER — Other Ambulatory Visit: Payer: Self-pay | Admitting: Family Medicine

## 2020-06-09 DIAGNOSIS — M5416 Radiculopathy, lumbar region: Secondary | ICD-10-CM

## 2020-06-11 MED ORDER — CYCLOBENZAPRINE HCL 10 MG PO TABS: ORAL_TABLET | ORAL | 0 refills | Status: DC

## 2020-06-26 ENCOUNTER — Encounter: Payer: Self-pay | Admitting: Oncology

## 2020-06-29 ENCOUNTER — Other Ambulatory Visit: Payer: Self-pay

## 2020-06-29 ENCOUNTER — Inpatient Hospital Stay: Payer: Managed Care, Other (non HMO) | Attending: Oncology | Admitting: Oncology

## 2020-06-29 DIAGNOSIS — Z9884 Bariatric surgery status: Secondary | ICD-10-CM | POA: Insufficient documentation

## 2020-06-29 DIAGNOSIS — D509 Iron deficiency anemia, unspecified: Secondary | ICD-10-CM | POA: Diagnosis not present

## 2020-06-29 DIAGNOSIS — Z79899 Other long term (current) drug therapy: Secondary | ICD-10-CM | POA: Insufficient documentation

## 2020-07-05 NOTE — Progress Notes (Signed)
I connected with Ashlee Cruz on 07/05/20 at  2:30 PM EST by video enabled telemedicine visit and verified that I am speaking with the correct person using two identifiers.   I discussed the limitations, risks, security and privacy concerns of performing an evaluation and management service by telemedicine and the availability of in-person appointments. I also discussed with the patient that there may be a patient responsible charge related to this service. The patient expressed understanding and agreed to proceed.  Other persons participating in the visit and their role in the encounter:  none  Patient's location:  home Provider's location:  work  Risk analyst Complaint:  Routine f/u of anemia  History of present illness:  Patient is a 54 year old female with a past medical history significant for diabetes, depression who has been referred to [place] for anemia.  Patient was recently seen by Dr. Bonna Gains for iron deficiency.  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  Recent labs were from June 2021 which showed H&H of 11.6/35.9 with an MCV of 81.  B12 and folate were normal.  Iron studies showed a low ferritin of 5 and low iron saturation of 6% with elevated TIBC of 494.  Patient reports ongoing fatigue.  Denies any blood loss in her stool or urine.  Denies any dark melanotic stools.  Denies any consistent use of NSAIDs.  No significant family history of colon cancer.  Interval history: Patient reports feeling better and improved energy after receiving IV iron.   Review of Systems  Constitutional: Negative for chills, fever, malaise/fatigue and weight loss.  HENT: Negative for congestion, ear discharge and nosebleeds.   Eyes: Negative for blurred vision.  Respiratory: Negative for cough, hemoptysis, sputum production, shortness of breath and wheezing.   Cardiovascular: Negative for chest pain, palpitations, orthopnea and claudication.  Gastrointestinal: Negative for abdominal  pain, blood in stool, constipation, diarrhea, heartburn, melena, nausea and vomiting.  Genitourinary: Negative for dysuria, flank pain, frequency, hematuria and urgency.  Musculoskeletal: Negative for back pain, joint pain and myalgias.  Skin: Negative for rash.  Neurological: Negative for dizziness, tingling, focal weakness, seizures, weakness and headaches.  Endo/Heme/Allergies: Does not bruise/bleed easily.  Psychiatric/Behavioral: Negative for depression and suicidal ideas. The patient does not have insomnia.     Allergies  Allergen Reactions  . Other Other (See Comments)    Any fragrant smell sets her off  . Latex     Pt reports prolonged exposure causes aggravation in psoriasis  . Codeine Rash    Past Medical History:  Diagnosis Date  . Allergic rhinitis   . Anemia   . Depression   . Family history of adverse reaction to anesthesia    Mother - PONV  . Hyperlipidemia   . Hypertension   . Metabolic syndrome   . Obesity   . PONV (postoperative nausea and vomiting)    after gallbladder surgery  . Psoriasis   . Sciatica of left side   . Vertigo    1 episode over 1 yr ago    Past Surgical History:  Procedure Laterality Date  . BARIATRIC SURGERY    . CHOLECYSTECTOMY    . COLONOSCOPY WITH PROPOFOL N/A 05/07/2020   Procedure: COLONOSCOPY WITH PROPOFOL;  Surgeon: Virgel Manifold, MD;  Location: [facility];  Service: Endoscopy;  Laterality: N/A;  . DILATION AND CURETTAGE OF UTERUS    . ESOPHAGOGASTRODUODENOSCOPY (EGD) WITH PROPOFOL N/A 05/07/2020   Procedure: ESOPHAGOGASTRODUODENOSCOPY (EGD) WITH BIOPSY;  Surgeon: Bonna Gains,  Lennette Bihari, MD;  Location: [facility];  Service: Endoscopy;  Laterality: N/A;  . LUMBAR SPINE SURGERY Left 02/2016   Pt reports lumbar surgery without placement of instrumentation to decompress nerve on left side.     Social History   Socioeconomic History  . Marital status: Married    Spouse name: Lennette Bihari  . Number of children:  1  . Years of education: Not on file  . Highest education level: Associate degree: occupational, Hotel manager, or vocational program  Occupational History  . Occupation: education/training   Tobacco Use  . Smoking status: Never Smoker  . Smokeless tobacco: Never Used  Vaping Use  . Vaping Use: Never used  Substance and Sexual Activity  . Alcohol use: No    Alcohol/week: 0.0 standard drinks  . Drug use: No  . Sexual activity: Yes    Partners: Male    Birth control/protection: None  Other Topics Concern  . Not on file  Social History Narrative  . Not on file   Social Determinants of Health   Financial Resource Strain: Low Risk   . Difficulty of Paying Living Expenses: Not hard at all  Food Insecurity: No Food Insecurity  . Worried About Charity fundraiser in the Last Year: Never true  . Ran Out of Food in the Last Year: Never true  Transportation Needs: No Transportation Needs  . Lack of Transportation (Medical): No  . Lack of Transportation (Non-Medical): No  Physical Activity: Insufficiently Active  . Days of Exercise per Week: 2 days  . Minutes of Exercise per Session: 20 min  Stress: No Stress Concern Present  . Feeling of Stress : Only a little  Social Connections: Socially Integrated  . Frequency of Communication with Friends and Family: More than three times a week  . Frequency of Social Gatherings with Friends and Family: More than three times a week  . Attends Religious Services: More than 4 times per year  . Active Member of Clubs or Organizations: Yes  . Attends Archivist Meetings: More than 4 times per year  . Marital Status: Married  Human resources officer Violence: Not At Risk  . Fear of Current or Ex-Partner: No  . Emotionally Abused: No  . Physically Abused: No  . Sexually Abused: No    Family History  Problem Relation Age of Onset  . Diabetes Mother   . Skin cancer Mother   . Diabetes Father   . COPD Father   . Seizures Father   . Heart  disease Father 83       CABG  . Diabetes Brother   . Multiple sclerosis Brother   . Stroke Paternal Grandmother      Current Outpatient Medications:  .  acetaminophen (TYLENOL) 500 MG tablet, Take 1,000 mg by mouth as needed for moderate pain., Disp: , Rfl:  .  BIOTIN 5000 PO, Take 1 Dose by mouth daily. , Disp: , Rfl:  .  cholecalciferol (VITAMIN D3) 25 MCG (1000 UT) tablet, Take 2,000 Units by mouth daily., Disp: , Rfl:  .  cyclobenzaprine (FLEXERIL) 10 MG tablet, TAKE 1 TABLET BY MOUTH 3  TIMES DAILY AS NEEDED FOR  MUSCLE SPASM(S), Disp: 90 tablet, Rfl: 0 .  DULoxetine (CYMBALTA) 60 MG capsule, Take 1 capsule (60 mg total) by mouth daily., Disp: 90 capsule, Rfl: 1 .  gabapentin (NEURONTIN) 300 MG capsule, TAKE 1 CAPSULE(300 MG) BY MOUTH AT BEDTIME, Disp: 90 capsule, Rfl: 1 .  metFORMIN (GLUCOPHAGE-XR) 500 MG 24  hr tablet, Take 1 tablet (500 mg total) by mouth daily with breakfast., Disp: 90 tablet, Rfl: 1 .  Multiple Vitamin (MULTIVITAMIN) tablet, Take 1 tablet by mouth daily. , Disp: , Rfl:  .  Na Sulfate-K Sulfate-Mg Sulf 17.5-3.13-1.6 GM/177ML SOLN, At 5 PM the day before procedure take 1 bottle and 5 hours before procedure take 1 bottle., Disp: 354 mL, Rfl: 0 .  OTEZLA 30 MG TABS, TAKE 1 TABLET BY MOUTH  TWICE DAILY, Disp: 60 tablet, Rfl: 5  No results found.  No images are attached to the encounter.   CMP Latest Ref Rng & Units 02/08/2020  Glucose 65 - 99 mg/dL 86  BUN 6 - 24 mg/dL 15  Creatinine 0.57 - 1.00 mg/dL 0.71  Sodium 134 - 144 mmol/L 140  Potassium 3.5 - 5.2 mmol/L 5.0  Chloride 96 - 106 mmol/L 100  CO2 20 - 29 mmol/L 26  Calcium 8.7 - 10.2 mg/dL 9.8  Total Protein 6.0 - 8.5 g/dL 6.8  Total Bilirubin 0.0 - 1.2 mg/dL 0.5  Alkaline Phos 48 - 121 IU/L 97  AST 0 - 40 IU/L 19  ALT 0 - 32 IU/L 17   CBC Latest Ref Rng & Units 02/08/2020  WBC 3.4 - 10.8 x10E3/uL 4.5  Hemoglobin 11.1 - 15.9 g/dL 11.6  Hematocrit 34.0 - 46.6 % 35.9  Platelets 150 - 450 x10E3/uL 304      Observation/objective: Appears in no acute distress over video visit today.  Breathing is nonlabored  Assessment and plan: Patient is a 54 year old female with history of iron deficiency anemia possibly secondary to gastric bypass.  This is a routine follow-up visit  Recent labs from [facility] on 06/25/2020 showed H&H of 14.3/42.3 with an MCV of 87 iron studies showed a ferritin of 253 and iron saturation of 41%.  She has responded well to IV iron with improvement of hemoglobin from 11.6-14.3.  Iron studies are normal.  She does not require IV iron at this time.  Follow-up instructions: CBC ferritin and iron studies and B12 to be checked at [facility] in 3 months in 6 months and I will see her in 6 months  I discussed the assessment and treatment plan with the patient. The patient was provided an opportunity to ask questions and all were answered. The patient agreed with the plan and demonstrated an understanding of the instructions.   The patient was advised to call back or seek an in-person evaluation if the symptoms worsen or if the condition fails to improve as anticipated.    Visit Diagnosis: 1. Iron deficiency anemia, unspecified iron deficiency anemia type     Dr. Randa Evens, MD, MPH [facility] Tel- [phone] 07/05/2020 12:27 PM

## 2020-07-18 ENCOUNTER — Encounter: Payer: Self-pay | Admitting: Family Medicine

## 2020-07-18 ENCOUNTER — Ambulatory Visit (INDEPENDENT_AMBULATORY_CARE_PROVIDER_SITE_OTHER): Payer: Managed Care, Other (non HMO) | Admitting: Family Medicine

## 2020-07-18 ENCOUNTER — Other Ambulatory Visit: Payer: Self-pay

## 2020-07-18 VITALS — BP 118/80 | HR 95 | Temp 97.9°F | Resp 17 | Ht 61.0 in | Wt 173.9 lb

## 2020-07-18 DIAGNOSIS — Z23 Encounter for immunization: Secondary | ICD-10-CM

## 2020-07-18 DIAGNOSIS — Z01419 Encounter for gynecological examination (general) (routine) without abnormal findings: Secondary | ICD-10-CM | POA: Diagnosis not present

## 2020-07-18 DIAGNOSIS — E8881 Metabolic syndrome: Secondary | ICD-10-CM

## 2020-07-18 DIAGNOSIS — Z9884 Bariatric surgery status: Secondary | ICD-10-CM

## 2020-07-18 DIAGNOSIS — E538 Deficiency of other specified B group vitamins: Secondary | ICD-10-CM

## 2020-07-18 DIAGNOSIS — F330 Major depressive disorder, recurrent, mild: Secondary | ICD-10-CM

## 2020-07-18 DIAGNOSIS — E559 Vitamin D deficiency, unspecified: Secondary | ICD-10-CM | POA: Diagnosis not present

## 2020-07-18 DIAGNOSIS — M5416 Radiculopathy, lumbar region: Secondary | ICD-10-CM

## 2020-07-18 DIAGNOSIS — E785 Hyperlipidemia, unspecified: Secondary | ICD-10-CM | POA: Diagnosis not present

## 2020-07-18 MED ORDER — METFORMIN HCL ER 500 MG PO TB24: 500.0000 mg | ORAL_TABLET | Freq: Every day | ORAL | 1 refills | Status: DC

## 2020-07-18 MED ORDER — DULOXETINE HCL 60 MG PO CPEP: 60.0000 mg | ORAL_CAPSULE | Freq: Every day | ORAL | 1 refills | Status: DC

## 2020-07-18 MED ORDER — METHYLPREDNISOLONE 4 MG PO TBPK: ORAL_TABLET | ORAL | 0 refills | Status: DC

## 2020-07-18 NOTE — Progress Notes (Signed)
Name: Ashlee Cruz   MRN: [id number]    DOB: Dec 22, 1965   Date:07/18/2020       Progress Note  Subjective  Chief Complaint  CPE  HPI  Patient presents for annual CPE and follow up  Pre-diabetes: last A1C was 5.9%,she is due for repeat labs, she has been more active lately, she denies polyphagia, polydipsia or polyphagia. She is on Metformin and denies side effects of medication   History of bariatric surgery: she has a history of iron deficiency anemia, she is taking MVI, her weight prior to surgery was 236 lbs, her weight went down to mid 130's, but has been stable in the 170's  lbs range. Stable   Left lower back pain: she had back surgery done by Dr. Consuella Lose in July 2017 and was doing great, but a few months after the surgery she had a flare. She was seen by Dr. Kathyrn Sheriff and had steroid injection end of November 2017and symptoms improved a little, she had another injection January 2018. She has been managing with Tylenol and Flexeril, she has mild intermittent flares, she was helping a nephew move recently and was sitting for a long time, she has some pain from left lower back to top of posterior thigh, no weakness , denies bowel or bladder incontinence   Major Depression Mild Recurrent:she is now on Cymbalta to help with pain and mood. She is able to focus, motivation is good. She had a small relapse in Dec, they moved, mother in law died , husband had a heart attack, but she is doing well now. On dulxetine.   Palpitation: she has intermittent symptoms, occasionally gets dizzy when she gets up quickly, but no chest pain   B12 and vitamin D deficiency:we will check levels today   Eczema: both hands , well controlled with medication, unchanged   Diet: she follows a healthy diet  Exercise: discussed 150 minutes per day     Office Visit from 08/02/2019 in [facility]  AUDIT-C Score 0     Depression: Phq 9 is  negative Depression screen [facility]  2/9 07/18/2020 01/17/2020 08/02/2019 01/31/2019 01/11/2019  Decreased Interest 0 0 0 0 0  Down, Depressed, Hopeless 0 0 0 1 0  PHQ - 2 Score 0 0 0 1 0  Altered sleeping 0 3 0 1 0  Tired, decreased energy 1 0 0 0 0  Change in appetite 1 0 0 1 0  Feeling bad or failure about yourself  0 0 0 0 0  Trouble concentrating 0 0 0 0 0  Moving slowly or fidgety/restless 1 0 0 0 0  Suicidal thoughts 0 0 0 0 0  PHQ-9 Score 3 3 0 3 0  Difficult doing work/chores - Not difficult at all Not difficult at all Not difficult at all Not difficult at all  Some recent data might be hidden   Hypertension: BP Readings from Last 3 Encounters:  07/18/20 118/80  05/17/20 110/81  05/10/20 106/76   Obesity: Wt Readings from Last 3 Encounters:  07/18/20 173 lb 14.4 oz (78.9 kg)  05/07/20 173 lb 15.1 oz (78.9 kg)  04/27/20 174 lb 1.6 oz (79 kg)   BMI Readings from Last 3 Encounters:  07/18/20 32.86 kg/m  05/07/20 32.87 kg/m  04/27/20 32.90 kg/m     Vaccines:   Shingrix: 63-64 yo and ask insurance if covered when patient above 53 yo Pneumonia:  educated and discussed with patient. Flu: educated and discussed with patient.  Hep C Screening: 02/08/20 STD testing and prevention (HIV/chl/gon/syphilis): not interested  Intimate partner violence: negative screen  Sexual History : no pain  Menstrual History/LMP/Abnormal Bleeding: about 2 years ago, postmenopausal  Incontinence Symptoms: she has noticed some urinary urgency   Breast cancer:  - Last Mammogram: 03/14/19 - BRCA gene screening:N/A  Osteoporosis: Discussed high calcium and vitamin D supplementation, weight bearing exercises  Cervical cancer screening: repeat in 2023  Skin cancer: Discussed monitoring for atypical lesions  Colorectal cancer:  Up to date repeat in 2031  Lung cancer:   Low Dose CT Chest recommended if Age 53-80 years, 20 pack-year currently smoking OR have quit w/in 15years. Patient does not qualify.   ECG: 09/16/2017  Advanced  Care Planning: A voluntary discussion about advance care planning including the explanation and discussion of advance directives.  Discussed health care proxy and Living will, and the patient was able to identify a health care proxy as husband  Patient does not have a living will at present time. If patient does have living will, I have requested they bring this to the clinic to be scanned in to their chart.  Lipids: Lab Results  Component Value Date   CHOL 230 (H) 02/08/2020   CHOL 234 (H) 01/28/2019   CHOL 244 (A) 04/06/2018   Lab Results  Component Value Date   HDL 97 02/08/2020   HDL 96 01/28/2019   HDL 88 (A) 04/06/2018   Lab Results  Component Value Date   LDLCALC 118 (H) 02/08/2020   LDLCALC 122 (H) 01/28/2019   LDLCALC 140 04/06/2018   Lab Results  Component Value Date   TRIG 88 02/08/2020   TRIG 78 01/28/2019   TRIG 81 04/06/2018   Lab Results  Component Value Date   CHOLHDL 2.4 02/08/2020   CHOLHDL 2.4 01/28/2019   CHOLHDL 2.6 03/27/2017   No results found for: LDLDIRECT  Glucose: Glucose  Date Value Ref Range Status  02/08/2020 86 65 - 99 mg/dL Final  03/27/2017 92 65 - 99 mg/dL Final  08/20/2016 85 65 - 99 mg/dL Final  10/29/2011 121 (H) 65 - 99 mg/dL Final   Glucose-Capillary  Date Value Ref Range Status  05/07/2020 92 70 - 99 mg/dL Final    Comment:    Glucose reference range applies only to samples taken after fasting for at least 8 hours.  05/07/2020 109 (H) 70 - 99 mg/dL Final    Comment:    Glucose reference range applies only to samples taken after fasting for at least 8 hours.    Patient Active Problem List   Diagnosis Date Noted  . Iron deficiency anemia 05/02/2020  . History of iron deficiency 01/11/2019  . Tachycardia 05/19/2017  . Palpitations 05/19/2017  . Fibroid uterus 12/20/2015  . History of iron deficiency anemia 10/23/2015  . Left lumbar radiculitis 10/23/2015  . Absence of menstruation 04/26/2015  . B12 deficiency  04/26/2015  . Depression, major, recurrent, mild ([place]) 04/26/2015  . Dyslipidemia 04/26/2015  . Dysmetabolic syndrome 56/43/3295  . Bariatric surgery status 04/26/2015  . H/O: HTN (hypertension) 04/26/2015  . Psoriasis 04/26/2015  . Allergic rhinitis, seasonal 04/26/2015  . Vitamin D deficiency 04/26/2015  . Bilateral knee pain 04/26/2015    Past Surgical History:  Procedure Laterality Date  . BARIATRIC SURGERY    . CHOLECYSTECTOMY    . COLONOSCOPY WITH PROPOFOL N/A 05/07/2020   Procedure: COLONOSCOPY WITH PROPOFOL;  Surgeon: Virgel Manifold, MD;  Location: [facility];  Service: Endoscopy;  Laterality: N/A;  . DILATION AND CURETTAGE OF UTERUS    . ESOPHAGOGASTRODUODENOSCOPY (EGD) WITH PROPOFOL N/A 05/07/2020   Procedure: ESOPHAGOGASTRODUODENOSCOPY (EGD) WITH BIOPSY;  Surgeon: Virgel Manifold, MD;  Location: [facility];  Service: Endoscopy;  Laterality: N/A;  . LUMBAR SPINE SURGERY Left 02/2016   Pt reports lumbar surgery without placement of instrumentation to decompress nerve on left side.     Family History  Problem Relation Age of Onset  . Diabetes Mother   . Skin cancer Mother   . Diabetes Father   . COPD Father   . Seizures Father   . Heart disease Father 62       CABG  . Diabetes Brother   . Multiple sclerosis Brother   . Stroke Paternal Grandmother     Social History   Socioeconomic History  . Marital status: Married    Spouse name: Lennette Bihari  . Number of children: 1  . Years of education: Not on file  . Highest education level: Associate degree: occupational, Hotel manager, or vocational program  Occupational History  . Occupation: education/training   Tobacco Use  . Smoking status: Never Smoker  . Smokeless tobacco: Never Used  Vaping Use  . Vaping Use: Never used  Substance and Sexual Activity  . Alcohol use: No    Alcohol/week: 0.0 standard drinks  . Drug use: No  . Sexual activity: Yes    Partners: Male    Birth  control/protection: None  Other Topics Concern  . Not on file  Social History Narrative  . Not on file   Social Determinants of Health   Financial Resource Strain: Low Risk   . Difficulty of Paying Living Expenses: Not hard at all  Food Insecurity: No Food Insecurity  . Worried About Charity fundraiser in the Last Year: Never true  . Ran Out of Food in the Last Year: Never true  Transportation Needs: No Transportation Needs  . Lack of Transportation (Medical): No  . Lack of Transportation (Non-Medical): No  Physical Activity: Insufficiently Active  . Days of Exercise per Week: 2 days  . Minutes of Exercise per Session: 20 min  Stress: Stress Concern Present  . Feeling of Stress : To some extent  Social Connections: Moderately Isolated  . Frequency of Communication with Friends and Family: More than three times a week  . Frequency of Social Gatherings with Friends and Family: Once a week  . Attends Religious Services: Never  . Active Member of Clubs or Organizations: No  . Attends Archivist Meetings: Never  . Marital Status: Married  Human resources officer Violence: Not At Risk  . Fear of Current or Ex-Partner: No  . Emotionally Abused: No  . Physically Abused: No  . Sexually Abused: No     Current Outpatient Medications:  .  acetaminophen (TYLENOL) 500 MG tablet, Take 1,000 mg by mouth as needed for moderate pain., Disp: , Rfl:  .  BIOTIN 5000 PO, Take 1 Dose by mouth daily. , Disp: , Rfl:  .  cholecalciferol (VITAMIN D3) 25 MCG (1000 UT) tablet, Take 2,000 Units by mouth daily., Disp: , Rfl:  .  cyclobenzaprine (FLEXERIL) 10 MG tablet, TAKE 1 TABLET BY MOUTH 3  TIMES DAILY AS NEEDED FOR  MUSCLE SPASM(S), Disp: 90 tablet, Rfl: 0 .  DULoxetine (CYMBALTA) 60 MG capsule, Take 1 capsule (60 mg total) by mouth daily., Disp: 90 capsule, Rfl: 1 .  gabapentin (NEURONTIN) 300 MG capsule, TAKE 1 CAPSULE(300 MG) BY MOUTH  AT BEDTIME, Disp: 90 capsule, Rfl: 1 .  metFORMIN  (GLUCOPHAGE-XR) 500 MG 24 hr tablet, Take 1 tablet (500 mg total) by mouth daily with breakfast., Disp: 90 tablet, Rfl: 1 .  Multiple Vitamin (MULTIVITAMIN) tablet, Take 1 tablet by mouth daily. , Disp: , Rfl:  .  OTEZLA 30 MG TABS, TAKE 1 TABLET BY MOUTH  TWICE DAILY, Disp: 60 tablet, Rfl: 5 .  Na Sulfate-K Sulfate-Mg Sulf 17.5-3.13-1.6 GM/177ML SOLN, At 5 PM the day before procedure take 1 bottle and 5 hours before procedure take 1 bottle. (Patient not taking: Reported on 07/18/2020), Disp: 354 mL, Rfl: 0  Allergies  Allergen Reactions  . Other Other (See Comments)    Any fragrant smell sets her off  . Latex     Pt reports prolonged exposure causes aggravation in psoriasis  . Codeine Rash     ROS  Constitutional: Negative for fever or weight change.  Respiratory: Negative for cough and shortness of breath.   Cardiovascular: Negative for chest pain or palpitations.  Gastrointestinal: Negative for abdominal pain, no bowel changes.  Musculoskeletal: Negative for gait problem or joint swelling.  Skin: Negative for rash.  Neurological: Negative for dizziness or headache.  No other specific complaints in a complete review of systems (except as listed in HPI above).  Objective  Vitals:   07/18/20 0817  BP: 118/80  Pulse: 95  Resp: 17  Temp: 97.9 F (36.6 C)  TempSrc: Oral  SpO2: 100%  Weight: 173 lb 14.4 oz (78.9 kg)  Height: $Remove'5\' 1"'NgllAJy$  (1.549 m)    Body mass index is 32.86 kg/m.  Physical Exam  Constitutional: Patient appears well-developed and well-nourished. No distress.  HENT: Head: Normocephalic and atraumatic. Ears: B TMs ok, no erythema or effusion; Nose: Not done. Mouth/Throat: not done  Eyes: Conjunctivae and EOM are normal. Pupils are equal, round, and reactive to light. No scleral icterus.  Neck: Normal range of motion. Neck supple. No JVD present. No thyromegaly present.  Cardiovascular: Normal rate, regular rhythm and normal heart sounds.  No murmur heard. No BLE  edema. Pulmonary/Chest: Effort normal and breath sounds normal. No respiratory distress. Abdominal: Soft. Bowel sounds are normal, no distension. There is no tenderness. no masses Breast: no lumps or masses, no nipple discharge or rashes FEMALE GENITALIA:  Not done RECTAL: not done Musculoskeletal: decrease rom of lumbar spine, negative straight leg raise  Neurological: he is alert and oriented to person, place, and time. No cranial nerve deficit. Coordination, balance, strength, speech and gait are normal.  Skin: Skin is warm and dry. No rash noted. No erythema.  Psychiatric: Patient has a normal mood and affect. behavior is normal. Judgment and thought content normal.  Recent Results (from the past 2160 hour(s))  SARS CORONAVIRUS 2 (TAT 6-24 HRS) Nasopharyngeal Nasopharyngeal Swab     Status: None   Collection Time: 05/03/20 11:44 AM   Specimen: Nasopharyngeal Swab  Result Value Ref Range   SARS Coronavirus 2 NEGATIVE NEGATIVE    Comment: (NOTE) SARS-CoV-2 target nucleic acids are NOT DETECTED.  The SARS-CoV-2 RNA is generally detectable in upper and lower respiratory specimens during the acute phase of infection. Negative results do not preclude SARS-CoV-2 infection, do not rule out co-infections with other pathogens, and should not be used as the sole basis for treatment or other patient management decisions. Negative results must be combined with clinical observations, patient history, and epidemiological information. The expected result is Negative.  Fact Sheet for Patients: SugarRoll.be  Fact Sheet  for Healthcare Providers: https://www.woods-mathews.com/  This test is not yet approved or cleared by the [place] and  has been authorized for detection and/or diagnosis of SARS-CoV-2 by FDA under an Emergency Use Authorization (EUA). This EUA will remain  in effect (meaning this test can be used) for the duration of  the COVID-19 declaration under Se ction 564(b)(1) of the Act, 21 U.S.C. section 360bbb-3(b)(1), unless the authorization is terminated or revoked sooner.  Performed at [facility], [place] [address], [place], [place] [address]   Glucose, capillary     Status: Abnormal   Collection Time: 05/07/20 12:17 PM  Result Value Ref Range   Glucose-Capillary 109 (H) 70 - 99 mg/dL    Comment: Glucose reference range applies only to samples taken after fasting for at least 8 hours.  Surgical pathology     Status: None   Collection Time: 05/07/20  1:31 PM  Result Value Ref Range   SURGICAL PATHOLOGY      SURGICAL PATHOLOGY CASE: [id number] PATIENT: Demisha Bosak Surgical Pathology Report     Specimen Submitted: A. Stomach polyps; cbx  Clinical History: IDA D50.9, colonscopy screening Z12.11.  Gastric polyps, diverticulosis.      DIAGNOSIS: A. STOMACH POLYPS; COLD BIOPSY: - FUNDIC GLAND POLYPS. - NEGATIVE FOR ACTIVE INFLAMMATION AND H PYLORI. - NEGATIVE FOR DYSPLASIA AND MALIGNANCY.   GROSS DESCRIPTION: A. Labeled: Gastric polyps (per requisition cold biopsy) Received: Formalin Tissue fragment(s): Multiple Size: Aggregate, 0.9 x 0.5 x 0.2 cm Description: Tan soft tissue fragments Entirely submitted in 1 cassette.    Final Diagnosis performed by Betsy Pries, MD.   Electronically signed 05/09/2020 11:31:01AM The electronic signature indicates that the named Attending Pathologist has evaluated the specimen Technical component performed at [facility], [address] [place], [place] [address] Lab: [phone] Dir: Rush Farmer, MD, MMM  Professional  component performed at [facility], [facility], [place], [place], [place] [id number] Lab: [phone] Dir: Dellia Nims. Rubinas, MD   Glucose, capillary     Status: None   Collection Time: 05/07/20  2:11 PM  Result Value Ref Range   Glucose-Capillary 92 70 - 99 mg/dL    Comment: Glucose reference  range applies only to samples taken after fasting for at least 8 hours.      Fall Risk: Fall Risk  07/18/2020 01/17/2020 08/02/2019 01/31/2019 01/11/2019  Falls in the past year? 1 0 1 0 0  Comment - - - - -  Number falls in past yr: 0 - 0 0 0  Injury with Fall? 0 - 0 0 0  Comment - - - - -  Follow up - - - - Falls evaluation completed    Functional Status Survey: Is the patient deaf or have difficulty hearing?: No Does the patient have difficulty seeing, even when wearing glasses/contacts?: No Does the patient have difficulty concentrating, remembering, or making decisions?: No Does the patient have difficulty walking or climbing stairs?: Yes (Due to back pain) Does the patient have difficulty dressing or bathing?: No Does the patient have difficulty doing errands alone such as visiting a doctor's office or shopping?: No   Assessment & Plan  1. Vitamin B12 deficiency  - Vitamin B12  2. Vitamin D deficiency  - VITAMIN D 25 Hydroxy (Vit-D Deficiency, Fractures)  3. Dyslipidemia  - Lipid panel - Comprehensive metabolic panel  4. Need for immunization against influenza  - Flu Vaccine QUAD 36+ mos IM  5. Dysmetabolic syndrome  - Comprehensive metabolic  panel - CBC with Differential/Platelet - metFORMIN (GLUCOPHAGE-XR) 500 MG 24 hr tablet; Take 1 tablet (500 mg total) by mouth daily with breakfast.  Dispense: 90 tablet; Refill: 1  6. Well woman exam   7. Need for shingles vaccine  - Varicella-zoster vaccine IM  8. Depression, major, recurrent, mild (H[facility])  - DULoxetine (CYMBALTA) 60 MG capsule; Take 1 capsule (60 mg total) by mouth daily.  Dispense: 90 capsule; Refill: 1  9. Left lumbar radiculitis  - DULoxetine (CYMBALTA) 60 MG capsule; Take 1 capsule (60 mg total) by mouth daily.  Dispense: 90 capsule; Refill: 1 - methylPREDNISolone (MEDROL DOSEPAK) 4 MG TBPK tablet; Take as directed  Dispense: 21 tablet; Refill: 0  10. Bariatric surgery status  - metFORMIN  (GLUCOPHAGE-XR) 500 MG 24 hr tablet; Take 1 tablet (500 mg total) by mouth daily with breakfast.  Dispense: 90 tablet; Refill: 1  -USPSTF grade A and B recommendations reviewed with patient; age-appropriate recommendations, preventive care, screening tests, etc discussed and encouraged; healthy living encouraged; see AVS for patient education given to patient -Discussed importance of 150 minutes of physical activity weekly, eat two servings of fish weekly, eat one serving of tree nuts ( cashews, pistachios, pecans, almonds.Marland Kitchen) every other day, eat 6 servings of fruit/vegetables daily and drink plenty of water and avoid sweet beverages.

## 2020-08-23 ENCOUNTER — Other Ambulatory Visit: Payer: Self-pay | Admitting: Family Medicine

## 2020-08-23 DIAGNOSIS — M5416 Radiculopathy, lumbar region: Secondary | ICD-10-CM

## 2020-08-23 MED ORDER — CYCLOBENZAPRINE HCL 10 MG PO TABS: ORAL_TABLET | ORAL | 0 refills | Status: DC

## 2020-09-19 ENCOUNTER — Ambulatory Visit (INDEPENDENT_AMBULATORY_CARE_PROVIDER_SITE_OTHER): Payer: Managed Care, Other (non HMO)

## 2020-09-19 ENCOUNTER — Other Ambulatory Visit: Payer: Self-pay

## 2020-09-19 DIAGNOSIS — Z23 Encounter for immunization: Secondary | ICD-10-CM | POA: Diagnosis not present

## 2020-09-19 NOTE — Progress Notes (Signed)
Shingles Shot

## 2020-09-21 ENCOUNTER — Other Ambulatory Visit: Payer: Self-pay | Admitting: Family Medicine

## 2020-09-21 DIAGNOSIS — M5416 Radiculopathy, lumbar region: Secondary | ICD-10-CM

## 2020-11-01 ENCOUNTER — Ambulatory Visit: Payer: Managed Care, Other (non HMO) | Admitting: Dermatology

## 2020-11-14 ENCOUNTER — Other Ambulatory Visit: Payer: Self-pay | Admitting: Family Medicine

## 2020-11-14 DIAGNOSIS — M5416 Radiculopathy, lumbar region: Secondary | ICD-10-CM

## 2020-11-14 MED ORDER — CYCLOBENZAPRINE HCL 10 MG PO TABS: ORAL_TABLET | ORAL | 0 refills | Status: DC

## 2020-12-06 ENCOUNTER — Ambulatory Visit: Payer: Managed Care, Other (non HMO) | Admitting: Family Medicine

## 2020-12-06 ENCOUNTER — Other Ambulatory Visit: Payer: Self-pay

## 2020-12-06 ENCOUNTER — Encounter: Payer: Self-pay | Admitting: Family Medicine

## 2020-12-06 VITALS — BP 118/72 | HR 87 | Temp 98.2°F | Resp 18 | Ht 61.0 in | Wt 174.3 lb

## 2020-12-06 DIAGNOSIS — M5442 Lumbago with sciatica, left side: Secondary | ICD-10-CM | POA: Diagnosis not present

## 2020-12-06 MED ORDER — PREDNISONE 20 MG PO TABS: ORAL_TABLET | ORAL | 0 refills | Status: AC

## 2020-12-06 NOTE — Progress Notes (Signed)
4/21/20221:45 PM  KIMBREE CASANAS [birth year], 55 y.o., female [id number]  Chief Complaint  Patient presents with  . Back Pain    HPI:   Patient is a 55 y.o. female with past medical history significant for DM, IDA, Allergies who presents today for back pain.  Has chronic back pain Worse over the last few weeks Middle lower back and radiates down to left knee Has had surgery in the past Doesn't want to go that route again When she coughs and sneezes it gets much worse Feels like tendons in left upper thigh are too short and pulling Had PT in the past and that helped Uses muscle relaxant at night and that helps Has struggled with using the stairs Currently taking gabapentin, and Tylenol PRN and flexeril prn Has found certain chairs that help Due to history of bypass does not take Ibuprofen Would like to know more about lidocaine and tens unit   Depression screen [facility] 2/9 12/06/2020 07/18/2020 01/17/2020  Decreased Interest 0 0 0  Down, Depressed, Hopeless 0 0 0  PHQ - 2 Score 0 0 0  Altered sleeping 0 0 3  Tired, decreased energy 0 1 0  Change in appetite 0 1 0  Feeling bad or failure about yourself  0 0 0  Trouble concentrating 0 0 0  Moving slowly or fidgety/restless 0 1 0  Suicidal thoughts 0 0 0  PHQ-9 Score 0 3 3  Difficult doing work/chores Not difficult at all - Not difficult at all  Some recent data might be hidden    Fall Risk  12/06/2020 07/18/2020 01/17/2020 08/02/2019 01/31/2019  Falls in the past year? 1 1 0 1 0  Comment - - - - -  Number falls in past yr: 0 0 - 0 0  Injury with Fall? 1 0 - 0 0  Comment - - - - -  Follow up - - - - -     Allergies  Allergen Reactions  . Other Other (See Comments)    Any fragrant smell sets her off  . Latex     Pt reports prolonged exposure causes aggravation in psoriasis  . Codeine Rash    Prior to Admission medications   Medication Sig Start Date End Date Taking? Authorizing Provider  acetaminophen (TYLENOL) 500 MG  tablet Take 1,000 mg by mouth as needed for moderate pain.   Yes [provider]  BIOTIN 5000 PO Take 1 Dose by mouth daily.    Yes [provider]  cholecalciferol (VITAMIN D3) 25 MCG (1000 UT) tablet Take 2,000 Units by mouth daily.   Yes [provider]  cyclobenzaprine (FLEXERIL) 10 MG tablet TAKE 1 TABLET BY MOUTH 3  TIMES DAILY AS NEEDED FOR  MUSCLE SPASM(S) 11/14/20  Yes Sowles, Drue Stager, MD  DULoxetine (CYMBALTA) 60 MG capsule Take 1 capsule (60 mg total) by mouth daily. 07/18/20  Yes Sowles, Drue Stager, MD  gabapentin (NEURONTIN) 300 MG capsule TAKE 1 CAPSULE(300 MG) BY MOUTH AT BEDTIME 09/21/20  Yes Sowles, Drue Stager, MD  metFORMIN (GLUCOPHAGE-XR) 500 MG 24 hr tablet Take 1 tablet (500 mg total) by mouth daily with breakfast. 07/18/20  Yes Sowles, Drue Stager, MD  Multiple Vitamin (MULTIVITAMIN) tablet Take 1 tablet by mouth daily.    Yes [provider]  OTEZLA 30 MG TABS TAKE 1 TABLET BY MOUTH  TWICE DAILY 06/07/20  Yes Ralene Bathe, MD    Past Medical History:  Diagnosis Date  . Allergic rhinitis   . Anemia   .  Depression   . Family history of adverse reaction to anesthesia    Mother - PONV  . Hyperlipidemia   . Hypertension   . Metabolic syndrome   . Obesity   . PONV (postoperative nausea and vomiting)    after gallbladder surgery  . Psoriasis   . Sciatica of left side   . Vertigo    1 episode over 1 yr ago    Past Surgical History:  Procedure Laterality Date  . BARIATRIC SURGERY    . CHOLECYSTECTOMY    . COLONOSCOPY WITH PROPOFOL N/A 05/07/2020   Procedure: COLONOSCOPY WITH PROPOFOL;  Surgeon: Virgel Manifold, MD;  Location: [place];  Service: Endoscopy;  Laterality: N/A;  . DILATION AND CURETTAGE OF UTERUS    . ESOPHAGOGASTRODUODENOSCOPY (EGD) WITH PROPOFOL N/A 05/07/2020   Procedure: ESOPHAGOGASTRODUODENOSCOPY (EGD) WITH BIOPSY;  Surgeon: Virgel Manifold, MD;  Location: [place];  Service: Endoscopy;   Laterality: N/A;  . LUMBAR SPINE SURGERY Left 02/2016   Pt reports lumbar surgery without placement of instrumentation to decompress nerve on left side.     Social History   Tobacco Use  . Smoking status: Never Smoker  . Smokeless tobacco: Never Used  Substance Use Topics  . Alcohol use: No    Alcohol/week: 0.0 standard drinks    Family History  Problem Relation Age of Onset  . Diabetes Mother   . Skin cancer Mother   . Diabetes Father   . COPD Father   . Seizures Father   . Heart disease Father 65       CABG  . Diabetes Brother   . Multiple sclerosis Brother   . Stroke Paternal Grandmother     Review of Systems  Respiratory: Negative.   Cardiovascular: Negative.   Gastrointestinal: Negative.   Genitourinary: Negative.   Musculoskeletal: Positive for back pain.  Neurological: Negative.      OBJECTIVE:  Today's Vitals   12/06/20 1320  BP: 118/72  Pulse: 87  Resp: 18  Temp: 98.2 F (36.8 C)  SpO2: 98%  Weight: 174 lb 4.8 oz (79.1 kg)  Height: 5\' 1"  (1.549 m)   Body mass index is 32.93 kg/m.   Physical Exam Constitutional:      General: She is not in acute distress.    Appearance: Normal appearance. She is not ill-appearing.  HENT:     Head: Normocephalic.  Cardiovascular:     Rate and Rhythm: Normal rate and regular rhythm.     Pulses: Normal pulses.     Heart sounds: Normal heart sounds. No murmur heard. No friction rub. No gallop.   Pulmonary:     Effort: Pulmonary effort is normal. No respiratory distress.     Breath sounds: Normal breath sounds. No stridor. No wheezing, rhonchi or rales.  Abdominal:     General: Bowel sounds are normal.     Palpations: Abdomen is soft.     Tenderness: There is no abdominal tenderness.  Musculoskeletal:     Cervical back: Normal.     Thoracic back: Normal.     Lumbar back: Normal.     Right lower leg: No edema.     Left lower leg: No edema.  Skin:    General: Skin is warm and dry.  Neurological:      Mental Status: She is alert and oriented to person, place, and time.  Psychiatric:        Mood and Affect: Mood normal.  Behavior: Behavior normal.     No results found for this or any previous visit (from the past 24 hour(s)).  No results found.   ASSESSMENT and PLAN  Problem List Items Addressed This Visit   None   Visit Diagnoses    Acute midline low back pain with left-sided sciatica    -  Primary   Relevant Medications   predniSONE (DELTASONE) 20 MG tablet   Other Relevant Orders   Ambulatory referral to Physical Therapy      Plan . R/se/b of medications ordered discussed . Continue current treatment regimen . RTC/ED precautions discussed . Discussed addition of conservative OTC treatments   Return if symptoms worsen or fail to improve.    Huston Foley Hermon Zea, FNP-BC [facility]

## 2020-12-06 NOTE — Patient Instructions (Signed)
Acute Back Pain, Adult Acute back pain is sudden and usually short-lived. It is often caused by an injury to the muscles and tissues in the back. The injury may result from:  A muscle or ligament getting overstretched or torn (strained). Ligaments are tissues that connect bones to each other. Lifting something improperly can cause a back strain.  Wear and tear (degeneration) of the spinal disks. Spinal disks are circular tissue that provide cushioning between the bones of the spine (vertebrae).  Twisting motions, such as while playing sports or doing yard work.  A hit to the back.  Arthritis. You may have a physical exam, lab tests, and imaging tests to find the cause of your pain. Acute back pain usually goes away with rest and home care. Follow these instructions at home: Managing pain, stiffness, and swelling  Treatment may include medicines for pain and inflammation that are taken by mouth or applied to the skin, prescription pain medicine, or muscle relaxants. Take over-the-counter and prescription medicines only as told by your health care provider.  Your health care provider may recommend applying ice during the first 24-48 hours after your pain starts. To do this: ? Put ice in a plastic bag. ? Place a towel between your skin and the bag. ? Leave the ice on for 20 minutes, 2-3 times a day.  If directed, apply heat to the affected area as often as told by your health care provider. Use the heat source that your health care provider recommends, such as a moist heat pack or a heating pad. ? Place a towel between your skin and the heat source. ? Leave the heat on for 20-30 minutes. ? Remove the heat if your skin turns bright red. This is especially important if you are unable to feel pain, heat, or cold. You have a greater risk of getting burned. Activity  Do not stay in bed. Staying in bed for more than 1-2 days can delay your recovery.  Sit up and stand up straight. Avoid leaning  forward when you sit or hunching over when you stand. ? If you work at a desk, sit close to it so you do not need to lean over. Keep your chin tucked in. Keep your neck drawn back, and keep your elbows bent at a 90-degree angle (right angle). ? Sit high and close to the steering wheel when you drive. Add lower back (lumbar) support to your car seat, if needed.  Take short walks on even surfaces as soon as you are able. Try to increase the length of time you walk each day.  Do not sit, drive, or stand in one place for more than 30 minutes at a time. Sitting or standing for long periods of time can put stress on your back.  Do not drive or use heavy machinery while taking prescription pain medicine.  Use proper lifting techniques. When you bend and lift, use positions that put less stress on your back: ? Bend your knees. ? Keep the load close to your body. ? Avoid twisting.  Exercise regularly as told by your health care provider. Exercising helps your back heal faster and helps prevent back injuries by keeping muscles strong and flexible.  Work with a physical therapist to make a safe exercise program, as recommended by your health care provider. Do any exercises as told by your physical therapist.   Lifestyle  Maintain a healthy weight. Extra weight puts stress on your back and makes it difficult to have   good posture.  Avoid activities or situations that make you feel anxious or stressed. Stress and anxiety increase muscle tension and can make back pain worse. Learn ways to manage anxiety and stress, such as through exercise. General instructions  Sleep on a firm mattress in a comfortable position. Try lying on your side with your knees slightly bent. If you lie on your back, put a pillow under your knees.  Follow your treatment plan as told by your health care provider. This may include: ? Cognitive or behavioral therapy. ? Acupuncture or massage therapy. ? Meditation or yoga. Contact  a health care provider if:  You have pain that is not relieved with rest or medicine.  You have increasing pain going down into your legs or buttocks.  Your pain does not improve after 2 weeks.  You have pain at night.  You lose weight without trying.  You have a fever or chills. Get help right away if:  You develop new bowel or bladder control problems.  You have unusual weakness or numbness in your arms or legs.  You develop nausea or vomiting.  You develop abdominal pain.  You feel faint. Summary  Acute back pain is sudden and usually short-lived.  Use proper lifting techniques. When you bend and lift, use positions that put less stress on your back.  Take over-the-counter and prescription medicines and apply heat or ice as directed by your health care provider. This information is not intended to replace advice given to you by your health care provider. Make sure you discuss any questions you have with your health care provider. Document Revised: 04/27/2020 Document Reviewed: 04/27/2020 Elsevier Patient Education  2021 [employer]  

## 2020-12-17 ENCOUNTER — Other Ambulatory Visit: Payer: Self-pay | Admitting: Dermatology

## 2020-12-21 NOTE — Patient Instructions (Incomplete)
Patient is a 55 y.o. female with past medical history significant for DM, IDA, Allergies who presents today for back pain.  Has chronic back pain Worse over the last few weeks Middle lower back and radiates down to left knee Has had surgery in the past Doesn't want to go that route again When she coughs and sneezes it gets much worse Feels like tendons in left upper thigh are too short and pulling Had PT in the past and that helped Uses muscle relaxant at night and that helps Has struggled with using the stairs Currently taking gabapentin, and Tylenol PRN and flexeril prn Has found certain chairs that help Due to history of bypass does not take Ibuprofen Would like to know more about lidocaine and tens unit   SUBJECTIVE Chief complaint:   History: Referring Dx: Referring Provider: Pain location:  Pain: Present /10, Best /10, Worst /10: Pain quality: {pain quality:18634:::1} Radiating pain: {yes/no:20286}  Numbness/Tingling: {yes/no:20286} 24 hour pain behavior:  Aggravating factors: Easing factors: How long can you sit: How long can you stand: How long can you walk: History of back injury, pain, surgery, or therapy: {yes/no:20286} Follow-up appointment with MD: {yes/no:20286} Dominant hand: {RIGHT/LEFT:20294} Imaging: {yes/no:20286}  Falls in the last 6 months: {yes/no:20286}  Occupational demands: Hobbies: Goals: Red flags (bowel/bladder changes, saddle paresthesia, personal history of cancer, chills/fever, night sweats, unrelenting pain, first onset of insidious LBP <20 y/o) Negative    OBJECTIVE  Mental Status Patient is oriented to person, place and time.  Recent memory is intact.  Remote memory is intact.  Attention span and concentration are intact.  Expressive speech is intact.  Patient's fund of knowledge is within normal limits for educational level.  SENSATION: Grossly intact to light touch bilateral LEs as determined by testing dermatomes  L2-S2 Proprioception and hot/cold testing deferred on this date   MUSCULOSKELETAL: Tremor: None Bulk: Normal Tone: Normal No visible step-off along spinal column  Posture Lumbar lordosis: WNL Iliac crest height: equal bilaterally Lumbar lateral shift: negative Lower crossed syndrome (tight hip flexors and erector spinae; weak gluts and abs): negative  Gait **Wide based gait = spinal stenosis (+LR 12)   Palpation   Strength (out of 5) R/L 5/5 Hip flexion 5/5 Hip ER 5/5 Hip IR 5/5 Hip abduction 5/5 Hip adduction 5/5 Hip extension 5/5 Knee extension 5/5 Knee flexion 5/5 Ankle dorsiflexion 5/5 Ankle plantarflexion 5 Trunk flexion 5 Trunk extension 5/5 Trunk rotation  *Indicates pain   AROM (degrees) R/L (all movements include overpressure unless otherwise stated) Lumbar forward flexion (65):  Lumbar extension (30): Lumbar lateral flexion (25): R:  L:  Thoracic and Lumbar rotation (30 degrees):  R: degrees L: degrees Hip IR (0-45): R: L: Hip ER (0-45): R: L: Hip Flexion (0-125):  Hip Abduction (0-40): R: L: Hip extension (0-15): R: L: *Indicates pain   PROM (degrees) PROM = AROM R/L (all movements include overpressure unless otherwise stated) Lumbar forward flexion (65 degrees):  Lumbar extension (30 degrees): Lumbar lateral flexion (25 degrees): R: degrees L: degrees Thoracic and Lumbar rotation (30 degrees):  R: degrees L: degrees    Repeated Movements No centralization or peripheralization of symptoms with repeated lumbar extension or flexion.    Muscle Length Hamstrings: R: degrees L: degrees  E[place]: Thomas:  Ober:    Passive Accessory Intervertebral Motion (PAIVM) Pt denies reproduction of back pain with CPA L1-L5 and UPA bilaterally L1-L5. Generally hypomobile throughout  Passive Physiological Intervertebral Motion (PPIVM) Normal flexion and extension with PPIVM testing  SPECIAL TESTS Lumbar Radiculopathy and Discogenic: Centralization  and Peripheralization (SN 92, -LR 0.12): {NEGATIVE/POSITIVE ZOX:09604} Slump (SN 83, -LR 0.32): R: {NEGATIVE/POSITIVE FOR:19998} L: {NEGATIVE/POSITIVE FOR:19998} SLR (SN 92, -LR 0.29): R: {NEGATIVE/POSITIVE VWU:98119} L:  {NEGATIVE/POSITIVE JYN:82956} Crossed SLR (SP 90): R: {NEGATIVE/POSITIVE OZH:08657} L: {NEGATIVE/POSITIVE QIO:96295}  Facet Joint: Extension-Rotation (SN 100, -LR 0.0): R: {NEGATIVE/POSITIVE MWU:13244} L: {NEGATIVE/POSITIVE WNU:27253}  Lumbar Spinal Stenosis: Lumbar quadrant (SN 70): R: {NEGATIVE/POSITIVE GUY:40347} L: {NEGATIVE/POSITIVE QQV:95638}  Hip: FABER (SN 81): R: {NEGATIVE/POSITIVE VFI:43329} L: {NEGATIVE/POSITIVE JJO:84166} FADIR (SN 94): R: {NEGATIVE/POSITIVE FOR:19998} L: {NEGATIVE/POSITIVE AYT:01601} Hip scour (SN 50): R: {NEGATIVE/POSITIVE UXN:23557} L: {NEGATIVE/POSITIVE DUK:02542}  SIJ:  Thigh Thrust (SN 88, -LR 0.18) : R: {NEGATIVE/POSITIVE HCW:23762} L: {NEGATIVE/POSITIVE GBT:51761}  Piriformis Syndrome: FAIR Test (SN 88, SP 83): R: {NEGATIVE/POSITIVE YWV:37106} L: {NEGATIVE/POSITIVE YIR:48546}  Functional Tasks Lifting:  Squatting:  Sit to stand:  Forward Step-Down Test: R: {NEGATIVE/POSITIVE EVO:35009} L: {NEGATIVE/POSITIVE FGH:82993} Lateral Step-Down Test: R: {NEGATIVE/POSITIVE ZJI:96789} L: {NEGATIVE/POSITIVE FYB:01751} Deep Squat: {NEGATIVE/POSITIVE WCH:85277}   ASSESSMENT Clinical Impression: Pt is a pleasant year-old female/female referred for low back pain. PT examination reveals deficits . Pt presents with deficits in strength, mobility, range of motion, and pain. Pt will benefit from skilled PT services to address deficits and return to pain-free function at home and work.    Pt will be independent with HEP in order to improve strength and decrease back pain in order to improve pain-free function at home and work.   Pt will decrease mODI score by at least 13 points in order demonstrate clinically significant reduction in back  pain/disability.        Pt will decrease Roland-Morris Disability Questionnaire (RMDQ) by at least 5 points in order to demonstrate a clinically significant reduction in back pain/disability  Pt will decrease worst back pain as reported on NPRS by at least 2 points in order to demonstrate clinically significant reduction in back pain.   Pt will increase strength of by at least 1/2 MMT grade in order to demonstrate improvement in strength and function.   Clinical Prediction Rule for Compression Fractures: 1. >52 y/o 2. no presence of leg pain 3. BMI <22 4. client not regularly exercising 5. female <1 of 5 = SN 97, - LR 0.16 >4 of 5 = SP 96, + LR 9.6  Clinical Prediction Rule for Spinal Stenosis: 1. Bilateral symptoms 2. Leg pain > back pain 3. Pain during walking or standing 4. Pain relief when sitting 5. Age >1 y/o <1 (+) finding = rule out stenosis (SN 96) 4 of 5 (+) findings = rule in stenosis (SP 98)  Clinical Prediction Rule for Facet Joint Syndrome (check pg 460): 1. Age > 58 y/o 2. Symptoms best when walking 3. Symptoms best when sitting 4. Onset of pain is paraspinal 5. (+) lumbar extension-rotation test >3 (+) findings = SP 91, +LR 9.7 <2 (+) findings = SN 100  Pain Provocation Cluster for SIJ Dysfunction Thigh Thrust Test (SN 88, -LR 0.18) Gaenslen's Test Distraction Test Compression Test Sacral Thrust Test  3 (+) tests: if discogenic pain has been ruled out through repeated extensions; SN 94, -LR 0.80  Rule out Hip OA (SN 86) Hip pain Hip IR <15 degrees morning stiffness < 60 min > 50 y/o    Neurogenic vs Vascular Claudication Idamae Schuller, 2016)  Description Neurogenic Vascular  Quality of pain cramping Burning, cramping  LBP Frequently present Absent   Sensory symptoms Frequently present Absent  Muscle weakness Frequently present Absent  Reflex  changes Frequently present Absent  Bicycle test Symptoms only when sitting upright Symptoms not position  dependent  Arterial pulses Normal  Decreased or absent  Skin/dystrophic changes Absent  Frequently present  Aggravating factors Upright posture, extension of trunk Any leg activity  Relieving factors Sitting, bending forward Rest, no leg activity  Walking uphill Symptoms produced later Not position dependent  Walking downhill Symptoms produced earlier Not position dependent    Lumbar Spinal Stenosis vs Radiculopathy due to Disc Herniation (Rieman, 2016)  Description Spinal Stenosis Disc Herniation  Age Usually > 32 y/o Usually < 71 y/o  Onset Usually more insidious Usually more sudden  Position change: flexion Better  Worse  Position change: extension Worse Better  Focal muscle weakness Less common Can be common  Dural tension Less common Common  UMN or LMN involvement  Central stenosis: UMN Lateral foraminal stenosis: LMN LMN

## 2020-12-24 ENCOUNTER — Ambulatory Visit: Payer: Managed Care, Other (non HMO)

## 2020-12-25 ENCOUNTER — Encounter: Payer: Self-pay | Admitting: Oncology

## 2020-12-25 ENCOUNTER — Encounter: Payer: Self-pay | Admitting: Family Medicine

## 2020-12-25 LAB — COMPREHENSIVE METABOLIC PANEL
ALT: 16 IU/L (ref 0–32)
AST: 17 IU/L (ref 0–40)
Albumin/Globulin Ratio: 2 (ref 1.2–2.2)
Albumin: 4.5 g/dL (ref 3.8–4.9)
Alkaline Phosphatase: 94 IU/L (ref 44–121)
BUN/Creatinine Ratio: 13 (ref 9–23)
BUN: 10 mg/dL (ref 6–24)
Bilirubin Total: 0.8 mg/dL (ref 0.0–1.2)
CO2: 23 mmol/L (ref 20–29)
Calcium: 9.4 mg/dL (ref 8.7–10.2)
Chloride: 103 mmol/L (ref 96–106)
Creatinine, Ser: 0.77 mg/dL (ref 0.57–1.00)
Globulin, Total: 2.2 g/dL (ref 1.5–4.5)
Glucose: 82 mg/dL (ref 65–99)
Potassium: 4.6 mmol/L (ref 3.5–5.2)
Sodium: 143 mmol/L (ref 134–144)
Total Protein: 6.7 g/dL (ref 6.0–8.5)
eGFR: 92 mL/min/{1.73_m2} (ref >59)

## 2020-12-25 LAB — CBC WITH DIFFERENTIAL/PLATELET
Basophils Absolute: 0.1 10*3/uL (ref 0.0–0.2)
Basos: 1 %
EOS (ABSOLUTE): 0.3 10*3/uL (ref 0.0–0.4)
Eos: 5 %
Hematocrit: 42.8 % (ref 34.0–46.6)
Hemoglobin: 14.6 g/dL (ref 11.1–15.9)
Immature Grans (Abs): 0 10*3/uL (ref 0.0–0.1)
Immature Granulocytes: 0 %
Lymphocytes Absolute: 1.4 10*3/uL (ref 0.7–3.1)
Lymphs: 27 %
MCH: 31.4 pg (ref 26.6–33.0)
MCHC: 34.1 g/dL (ref 31.5–35.7)
MCV: 92 fL (ref 79–97)
Monocytes Absolute: 0.4 10*3/uL (ref 0.1–0.9)
Monocytes: 7 %
Neutrophils Absolute: 3 10*3/uL (ref 1.4–7.0)
Neutrophils: 60 %
Platelets: 290 10*3/uL (ref 150–450)
RBC: 4.65 x10E6/uL (ref 3.77–5.28)
RDW: 11 % — ABNORMAL LOW (ref 11.7–15.4)
WBC: 5.1 10*3/uL (ref 3.4–10.8)

## 2020-12-25 LAB — LIPID PANEL
Chol/HDL Ratio: 2.8 ratio (ref 0.0–4.4)
Cholesterol, Total: 256 mg/dL — ABNORMAL HIGH (ref 100–199)
HDL: 93 mg/dL (ref >39)
LDL Chol Calc (NIH): 146 mg/dL — ABNORMAL HIGH (ref 0–99)
Triglycerides: 100 mg/dL (ref 0–149)
VLDL Cholesterol Cal: 17 mg/dL (ref 5–40)

## 2020-12-25 LAB — VITAMIN B12: Vitamin B-12: 385 pg/mL (ref 232–1245)

## 2020-12-25 LAB — VITAMIN D 25 HYDROXY (VIT D DEFICIENCY, FRACTURES): Vit D, 25-Hydroxy: 29.2 ng/mL — ABNORMAL LOW (ref 30.0–100.0)

## 2020-12-26 ENCOUNTER — Ambulatory Visit: Payer: Managed Care, Other (non HMO)

## 2020-12-28 ENCOUNTER — Other Ambulatory Visit: Payer: Self-pay

## 2020-12-28 ENCOUNTER — Inpatient Hospital Stay: Payer: Managed Care, Other (non HMO) | Attending: Oncology | Admitting: Oncology

## 2020-12-28 VITALS — BP 120/78 | HR 90 | Temp 96.3°F | Wt 175.6 lb

## 2020-12-28 DIAGNOSIS — Z9884 Bariatric surgery status: Secondary | ICD-10-CM | POA: Diagnosis not present

## 2020-12-28 DIAGNOSIS — D509 Iron deficiency anemia, unspecified: Secondary | ICD-10-CM | POA: Insufficient documentation

## 2020-12-28 DIAGNOSIS — Z79899 Other long term (current) drug therapy: Secondary | ICD-10-CM | POA: Diagnosis not present

## 2021-01-03 ENCOUNTER — Encounter: Payer: Self-pay | Admitting: Oncology

## 2021-01-03 NOTE — Progress Notes (Signed)
Hematology/Oncology Consult note [facility]  Telephone:([phone] [phone]  Patient Care Team: Steele Sizer, MD as PCP - General (Family Medicine)   Name of the patient: Ashlee Cruz  [id number]  Oct 14, 1965   Date of visit: 01/03/21  Diagnosis-iron deficiency anemia possibly secondary to gastric bypass  Chief complaint/ Reason for visit-routine follow-up of iron deficiency anemia  Heme/Onc history: Patient is a 55 year old female with a past medical history significant for diabetes, depression who has been referred to [place] for anemia. Patient was recently seen by Dr. Bonna Gains for iron deficiency. 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 Recent labs were from June 2021 which showed H&H of 11.6/35.9 with an MCV of 81. B12 and folate were normal. Iron studies showed a low ferritin of 5 and low iron saturation of 6% with elevated TIBC of 494.Patient reports ongoing fatigue. Denies any blood loss in her stool or urine. Denies any dark melanotic stools. Denies any consistent use of NSAIDs. No significant family history of colon cancer.  Interval history-patient reports doing well and denies any specific complaints at this time.  Appetite and weight have remained stable.  Energy levels are good.  Denies any bleeding in her stool or urine  ECOG PS- 0 Pain scale- 0   Review of systems- Review of Systems  Constitutional: Negative for chills, fever, malaise/fatigue and weight loss.  HENT: Negative for congestion, ear discharge and nosebleeds.   Eyes: Negative for blurred vision.  Respiratory: Negative for cough, hemoptysis, sputum production, shortness of breath and wheezing.   Cardiovascular: Negative for chest pain, palpitations, orthopnea and claudication.  Gastrointestinal: Negative for abdominal pain, blood in stool, constipation, diarrhea, heartburn, melena, nausea and vomiting.  Genitourinary: Negative for  dysuria, flank pain, frequency, hematuria and urgency.  Musculoskeletal: Negative for back pain, joint pain and myalgias.  Skin: Negative for rash.  Neurological: Negative for dizziness, tingling, focal weakness, seizures, weakness and headaches.  Endo/Heme/Allergies: Does not bruise/bleed easily.  Psychiatric/Behavioral: Negative for depression and suicidal ideas. The patient does not have insomnia.        Allergies  Allergen Reactions  . Other Other (See Comments)    Any fragrant smell sets her off  . Latex     Pt reports prolonged exposure causes aggravation in psoriasis  . Codeine Rash     Past Medical History:  Diagnosis Date  . Allergic rhinitis   . Anemia   . Depression   . Family history of adverse reaction to anesthesia    Mother - PONV  . Hyperlipidemia   . Hypertension   . Metabolic syndrome   . Obesity   . PONV (postoperative nausea and vomiting)    after gallbladder surgery  . Psoriasis   . Sciatica of left side   . Vertigo    1 episode over 1 yr ago     Past Surgical History:  Procedure Laterality Date  . BARIATRIC SURGERY    . CHOLECYSTECTOMY    . COLONOSCOPY WITH PROPOFOL N/A 05/07/2020   Procedure: COLONOSCOPY WITH PROPOFOL;  Surgeon: Virgel Manifold, MD;  Location: [place];  Service: Endoscopy;  Laterality: N/A;  . DILATION AND CURETTAGE OF UTERUS    . ESOPHAGOGASTRODUODENOSCOPY (EGD) WITH PROPOFOL N/A 05/07/2020   Procedure: ESOPHAGOGASTRODUODENOSCOPY (EGD) WITH BIOPSY;  Surgeon: Virgel Manifold, MD;  Location: [place];  Service: Endoscopy;  Laterality: N/A;  . LUMBAR SPINE SURGERY Left 02/2016   Pt reports lumbar  surgery without placement of instrumentation to decompress nerve on left side.     Social History   Socioeconomic History  . Marital status: Married    Spouse name: Lennette Bihari  . Number of children: 1  . Years of education: Not on file  . Highest education level: Associate degree: occupational,  Hotel manager, or vocational program  Occupational History  . Occupation: education/training   Tobacco Use  . Smoking status: Never Smoker  . Smokeless tobacco: Never Used  Vaping Use  . Vaping Use: Never used  Substance and Sexual Activity  . Alcohol use: No    Alcohol/week: 0.0 standard drinks  . Drug use: No  . Sexual activity: Yes    Partners: Male    Birth control/protection: None  Other Topics Concern  . Not on file  Social History Narrative  . Not on file   Social Determinants of Health   Financial Resource Strain: Low Risk   . Difficulty of Paying Living Expenses: Not hard at all  Food Insecurity: No Food Insecurity  . Worried About Charity fundraiser in the Last Year: Never true  . Ran Out of Food in the Last Year: Never true  Transportation Needs: No Transportation Needs  . Lack of Transportation (Medical): No  . Lack of Transportation (Non-Medical): No  Physical Activity: Insufficiently Active  . Days of Exercise per Week: 2 days  . Minutes of Exercise per Session: 20 min  Stress: Stress Concern Present  . Feeling of Stress : To some extent  Social Connections: Moderately Isolated  . Frequency of Communication with Friends and Family: More than three times a week  . Frequency of Social Gatherings with Friends and Family: Once a week  . Attends Religious Services: Never  . Active Member of Clubs or Organizations: No  . Attends Archivist Meetings: Never  . Marital Status: Married  Human resources officer Violence: Not At Risk  . Fear of Current or Ex-Partner: No  . Emotionally Abused: No  . Physically Abused: No  . Sexually Abused: No    Family History  Problem Relation Age of Onset  . Diabetes Mother   . Skin cancer Mother   . Diabetes Father   . COPD Father   . Seizures Father   . Heart disease Father 5       CABG  . Diabetes Brother   . Multiple sclerosis Brother   . Stroke Paternal Grandmother      Current Outpatient Medications:  .   BIOTIN 5000 PO, Take 1 Dose by mouth daily. , Disp: , Rfl:  .  cholecalciferol (VITAMIN D3) 25 MCG (1000 UT) tablet, Take 2,000 Units by mouth daily., Disp: , Rfl:  .  cyclobenzaprine (FLEXERIL) 10 MG tablet, TAKE 1 TABLET BY MOUTH 3  TIMES DAILY AS NEEDED FOR  MUSCLE SPASM(S), Disp: 90 tablet, Rfl: 0 .  DULoxetine (CYMBALTA) 60 MG capsule, Take 1 capsule (60 mg total) by mouth daily., Disp: 90 capsule, Rfl: 1 .  gabapentin (NEURONTIN) 300 MG capsule, TAKE 1 CAPSULE(300 MG) BY MOUTH AT BEDTIME, Disp: 90 capsule, Rfl: 1 .  metFORMIN (GLUCOPHAGE-XR) 500 MG 24 hr tablet, Take 1 tablet (500 mg total) by mouth daily with breakfast., Disp: 90 tablet, Rfl: 1 .  Multiple Vitamin (MULTIVITAMIN) tablet, Take 1 tablet by mouth daily. , Disp: , Rfl:  .  OTEZLA 30 MG TABS, TAKE 1 TABLET BY MOUTH  TWICE DAILY, Disp: 60 tablet, Rfl: 5 .  acetaminophen (TYLENOL) 500 MG tablet,  Take 1,000 mg by mouth as needed for moderate pain. (Patient not taking: Reported on 12/28/2020), Disp: , Rfl:   Physical exam:  Vitals:   12/28/20 1426  BP: 120/78  Pulse: 90  Temp: (!) 96.3 F (35.7 C)  TempSrc: Tympanic  SpO2: 99%  Weight: 175 lb 9.6 oz (79.7 kg)   Physical Exam Constitutional:      General: She is not in acute distress. Cardiovascular:     Rate and Rhythm: Normal rate and regular rhythm.     Heart sounds: Normal heart sounds.  Pulmonary:     Effort: Pulmonary effort is normal.     Breath sounds: Normal breath sounds.  Skin:    General: Skin is warm and dry.  Neurological:     Mental Status: She is alert and oriented to person, place, and time.      CMP Latest Ref Rng & Units 12/24/2020  Glucose 65 - 99 mg/dL 82  BUN 6 - 24 mg/dL 10  Creatinine 0.57 - 1.00 mg/dL 0.77  Sodium 134 - 144 mmol/L 143  Potassium 3.5 - 5.2 mmol/L 4.6  Chloride 96 - 106 mmol/L 103  CO2 20 - 29 mmol/L 23  Calcium 8.7 - 10.2 mg/dL 9.4  Total Protein 6.0 - 8.5 g/dL 6.7  Total Bilirubin 0.0 - 1.2 mg/dL 0.8  Alkaline Phos 44  - 121 IU/L 94  AST 0 - 40 IU/L 17  ALT 0 - 32 IU/L 16   CBC Latest Ref Rng & Units 12/24/2020  WBC 3.4 - 10.8 x10E3/uL 5.1  Hemoglobin 11.1 - 15.9 g/dL 14.6  Hematocrit 34.0 - 46.6 % 42.8  Platelets 150 - 450 x10E3/uL 290    Assessment and plan- Patient is a 55 y.o. female with history of iron deficiency anemia possibly secondary to gastric bypass here for routine follow-up  Patient's hemoglobin is currently stable around 14.  B12 normal at 385.  Ferritin levels normal at 107.  She does not require any IV iron at this time.  Repeat CBC ferritin iron studies and B12 in 6 months in 1 year and I will see her back in 1 year.  Labs to be done at [facility]    Visit Diagnosis 1. Iron deficiency anemia, unspecified iron deficiency anemia type   2. History of gastric bypass      Dr. Randa Evens, MD, MPH [facility] [phone] 01/03/2021 10:23 AM

## 2021-01-08 ENCOUNTER — Other Ambulatory Visit: Payer: Self-pay

## 2021-01-08 ENCOUNTER — Ambulatory Visit: Payer: Managed Care, Other (non HMO) | Admitting: Dermatology

## 2021-01-08 DIAGNOSIS — L409 Psoriasis, unspecified: Secondary | ICD-10-CM | POA: Diagnosis not present

## 2021-01-08 MED ORDER — WYNZORA 0.005-0.064 % EX CREA: 1.0000 "application " | TOPICAL_CREAM | Freq: Every day | CUTANEOUS | 6 refills | Status: DC

## 2021-01-08 NOTE — Progress Notes (Signed)
   Follow-Up Visit   Subjective  Ashlee Cruz is a 55 y.o. female who presents for the following: Psoriasis (6 month follow up - Otezla 30 mg bid).  The following portions of the chart were reviewed this encounter and updated as appropriate:   Tobacco  Allergies  Meds  Problems  Med Hx  Surg Hx  Fam Hx     Review of Systems:  No other skin or systemic complaints except as noted in HPI or Assessment and Plan.  Objective  Well appearing patient in no apparent distress; mood and affect are within normal limits.  A focused examination was performed including hands. Relevant physical exam findings are noted in the Assessment and Plan.  Objective  Hands: Pinkness and peeling right prox palm > left palm   Assessment & Plan  Psoriasis -severe but better controlled on the hands with systemic oral Otezla.  She is much improved but not to goal.  We will continue [place] and initiate topical Wynzora today. Hands  Mild flare today. No depression or weight loss. No GI upset. Does have a headache once in awhile but the benefit of the medication outweighs the occasional headache.  Psoriasis is a chronic non-curable, but treatable genetic/hereditary disease that may have other systemic features affecting other organ systems such as joints (Psoriatic Arthritis). It is associated with an increased risk of inflammatory bowel disease, heart disease, non-alcoholic fatty liver disease, and depression.    Continue Otezla 30 mg 1 po bid  Wynzora cream qd prn - samples given  Calcipotriene-Betameth Diprop [facility]) 0.005-0.064 % CREA - Hands  Return in about 6 months (around 07/11/2021) for Psoriasis.   I, Ashok Cordia, CMA, am acting as scribe for Sarina Ser, MD .  Documentation: I have reviewed the above documentation for accuracy and completeness, and I agree with the above.  Sarina Ser, MD

## 2021-01-08 NOTE — Patient Instructions (Signed)

## 2021-01-11 ENCOUNTER — Ambulatory Visit: Payer: Managed Care, Other (non HMO)

## 2021-01-12 ENCOUNTER — Encounter: Payer: Self-pay | Admitting: Dermatology

## 2021-01-17 ENCOUNTER — Ambulatory Visit: Payer: Managed Care, Other (non HMO) | Admitting: Family Medicine

## 2021-01-17 ENCOUNTER — Encounter: Payer: Self-pay | Admitting: Family Medicine

## 2021-01-17 ENCOUNTER — Other Ambulatory Visit: Payer: Self-pay

## 2021-01-17 VITALS — BP 110/68 | HR 91 | Temp 98.4°F | Resp 16 | Ht 61.0 in | Wt 173.0 lb

## 2021-01-17 DIAGNOSIS — E785 Hyperlipidemia, unspecified: Secondary | ICD-10-CM

## 2021-01-17 DIAGNOSIS — Z9884 Bariatric surgery status: Secondary | ICD-10-CM

## 2021-01-17 DIAGNOSIS — G8929 Other chronic pain: Secondary | ICD-10-CM

## 2021-01-17 DIAGNOSIS — E538 Deficiency of other specified B group vitamins: Secondary | ICD-10-CM | POA: Diagnosis not present

## 2021-01-17 DIAGNOSIS — M5442 Lumbago with sciatica, left side: Secondary | ICD-10-CM | POA: Diagnosis not present

## 2021-01-17 DIAGNOSIS — E8881 Metabolic syndrome: Secondary | ICD-10-CM

## 2021-01-17 DIAGNOSIS — M5416 Radiculopathy, lumbar region: Secondary | ICD-10-CM

## 2021-01-17 DIAGNOSIS — F330 Major depressive disorder, recurrent, mild: Secondary | ICD-10-CM

## 2021-01-17 DIAGNOSIS — E559 Vitamin D deficiency, unspecified: Secondary | ICD-10-CM

## 2021-01-17 DIAGNOSIS — Z1231 Encounter for screening mammogram for malignant neoplasm of breast: Secondary | ICD-10-CM

## 2021-01-17 MED ORDER — PREGABALIN 75 MG PO CAPS: 75.0000 mg | ORAL_CAPSULE | Freq: Three times a day (TID) | ORAL | 0 refills | Status: DC

## 2021-01-17 MED ORDER — DULOXETINE HCL 60 MG PO CPEP
60.0000 mg | ORAL_CAPSULE | Freq: Every day | ORAL | 1 refills | Status: DC
Start: 2021-01-17 — End: 2021-06-20

## 2021-01-17 MED ORDER — METAXALONE 800 MG PO TABS: 800.0000 mg | ORAL_TABLET | Freq: Three times a day (TID) | ORAL | 0 refills | Status: DC | PRN

## 2021-01-17 MED ORDER — METFORMIN HCL ER 500 MG PO TB24
500.0000 mg | ORAL_TABLET | Freq: Every day | ORAL | 1 refills | Status: DC
Start: 2021-01-17 — End: 2021-06-20

## 2021-01-17 NOTE — Progress Notes (Signed)
Name: Ashlee Cruz   MRN: [id number]    DOB: 08-23-1965   Date:01/17/2021       Progress Note  Subjective  Chief Complaint  Follow Up  HPI  Pre-diabetes: last A1C was 5.7%,she is due for repeat labs, she has been more active lately, she denies polyphagia, polydipsia or polyphagia. She is on Metformin and denies side effects of medication   History of bariatric surgery: she has a history of iron deficiency anemia, she is taking MVI, her weight prior to surgery was 236 lbs, her weight went down to mid 130's, but has been stable in the 170's  lbs range. She tries to have a balanced diet.   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 Consuella Lose in July 2017 and was doing great, but a few months after the surgery she had a flare. She was seen by Dr. Kathyrn Sheriff and had steroid injection end of November 2017and symptoms improved a little, she had another injection January 2018. She has been managing with Tylenol and Flexeril, she has mild intermittent flares, last episode that required prednisone was 11/2020, she noticed recurrence of symptoms when it tapered down to one pill daily. She is still taking gabapentin every night, pain is now constant mostly mild throughout the day, but has difficulty putting pants on, going up stairs, sitting for a long time causes increase in pain and radiculitis. She has episodes of spasms on her back that makes her stop moving.   Major Depression Mild Recurrent:she is now on Cymbalta to help with pain and mood. She is able to focus, motivation is good. She states TV news can be overwhelmed   Palpitation: she has intermittent symptoms, occasionally gets dizzy when she gets up quickly, but no chest pain   B12 and vitamin D deficiency: last B12 level was normal, vitamin D almost 30, continue supplementation   Eczema: both hands , well controlled with medication, sees Dr. Nehemiah Massed   Dyslipidemia: discussed life style  modification   The 10-year ASCVD risk score Mikey Bussing DC Brooke Bonito., et al., 2013) is: 0.9%   Values used to calculate the score:     Age: 32 years     Sex: Female     Is Non-Hispanic African American: No     Diabetic: No     Tobacco smoker: No     Systolic Blood Pressure: 937 mmHg     Is BP treated: No     HDL Cholesterol: 93 mg/dL     Total Cholesterol: 256 mg/dL  Patient Active Problem List   Diagnosis Date Noted  . Iron deficiency anemia 05/02/2020  . History of iron deficiency 01/11/2019  . Tachycardia 05/19/2017  . Palpitations 05/19/2017  . Fibroid uterus 12/20/2015  . History of iron deficiency anemia 10/23/2015  . Left lumbar radiculitis 10/23/2015  . Absence of menstruation 04/26/2015  . B12 deficiency 04/26/2015  . Depression, major, recurrent, mild ([place]) 04/26/2015  . Dyslipidemia 04/26/2015  . Dysmetabolic syndrome 90/24/0973  . Bariatric surgery status 04/26/2015  . H/O: HTN (hypertension) 04/26/2015  . Psoriasis 04/26/2015  . Allergic rhinitis, seasonal 04/26/2015  . Vitamin D deficiency 04/26/2015  . Bilateral knee pain 04/26/2015    Past Surgical History:  Procedure Laterality Date  . BARIATRIC SURGERY    . CHOLECYSTECTOMY    . COLONOSCOPY WITH PROPOFOL N/A 05/07/2020   Procedure: COLONOSCOPY WITH PROPOFOL;  Surgeon: Virgel Manifold, MD;  Location: [place];  Service:  Endoscopy;  Laterality: N/A;  . DILATION AND CURETTAGE OF UTERUS    . ESOPHAGOGASTRODUODENOSCOPY (EGD) WITH PROPOFOL N/A 05/07/2020   Procedure: ESOPHAGOGASTRODUODENOSCOPY (EGD) WITH BIOPSY;  Surgeon: Virgel Manifold, MD;  Location: [place];  Service: Endoscopy;  Laterality: N/A;  . LUMBAR SPINE SURGERY Left 02/2016   Pt reports lumbar surgery without placement of instrumentation to decompress nerve on left side.     Family History  Problem Relation Age of Onset  . Diabetes Mother   . Skin cancer Mother   . Diabetes Father   . COPD Father   . Seizures Father    . Heart disease Father 73       CABG  . Diabetes Brother   . Multiple sclerosis Brother   . Stroke Paternal Grandmother     Social History   Tobacco Use  . Smoking status: Never Smoker  . Smokeless tobacco: Never Used  Substance Use Topics  . Alcohol use: No    Alcohol/week: 0.0 standard drinks     Current Outpatient Medications:  .  acetaminophen (TYLENOL) 500 MG tablet, Take 1,000 mg by mouth as needed for moderate pain., Disp: , Rfl:  .  BIOTIN 5000 PO, Take 1 Dose by mouth daily. , Disp: , Rfl:  .  cholecalciferol (VITAMIN D3) 25 MCG (1000 UT) tablet, Take 2,000 Units by mouth daily., Disp: , Rfl:  .  cyclobenzaprine (FLEXERIL) 10 MG tablet, TAKE 1 TABLET BY MOUTH 3  TIMES DAILY AS NEEDED FOR  MUSCLE SPASM(S), Disp: 90 tablet, Rfl: 0 .  DULoxetine (CYMBALTA) 60 MG capsule, Take 1 capsule (60 mg total) by mouth daily., Disp: 90 capsule, Rfl: 1 .  gabapentin (NEURONTIN) 300 MG capsule, TAKE 1 CAPSULE(300 MG) BY MOUTH AT BEDTIME, Disp: 90 capsule, Rfl: 1 .  metFORMIN (GLUCOPHAGE-XR) 500 MG 24 hr tablet, Take 1 tablet (500 mg total) by mouth daily with breakfast., Disp: 90 tablet, Rfl: 1 .  Multiple Vitamin (MULTIVITAMIN) tablet, Take 1 tablet by mouth daily. , Disp: , Rfl:  .  OTEZLA 30 MG TABS, TAKE 1 TABLET BY MOUTH  TWICE DAILY, Disp: 60 tablet, Rfl: 5 .  Calcipotriene-Betameth Diprop (WYNZORA) 0.005-0.064 % CREA, Apply 1 application topically daily. (Patient not taking: Reported on 01/17/2021), Disp: 60 g, Rfl: 6  Allergies  Allergen Reactions  . Other Other (See Comments)    Any fragrant smell sets her off  . Latex     Pt reports prolonged exposure causes aggravation in psoriasis  . Codeine Rash    I personally reviewed active problem list, medication list, allergies, family history, social history, health maintenance, notes from last encounter with the patient/caregiver today.   ROS  Constitutional: Negative for fever or weight change.  Respiratory: Negative for  cough and shortness of breath.   Cardiovascular: Negative for chest pain or palpitations.  Gastrointestinal: Negative for abdominal pain, no bowel changes.  Musculoskeletal: Negative for gait problem or joint swelling.  Skin: Negative for rash.  Neurological: Negative for dizziness or headache.  No other specific complaints in a complete review of systems (except as listed in HPI above).  Objective  Vitals:   01/17/21 0805  BP: 110/68  Pulse: 91  Resp: 16  Temp: 98.4 F (36.9 C)  TempSrc: Oral  SpO2: 97%  Weight: 173 lb (78.5 kg)  Height: 5' 1" (1.549 m)    Body mass index is 32.69 kg/m.  Physical Exam  Constitutional: Patient appears well-developed and well-nourished. Obese No distress.  HEENT: head  atraumatic, normocephalic, pupils equal and reactive to light,  neck supple Cardiovascular: Normal rate, regular rhythm and normal heart sounds.  No murmur heard. No BLE edema. Pulmonary/Chest: Effort normal and breath sounds normal. No respiratory distress. Abdominal: Soft.  There is no tenderness. Psychiatric: Patient has a normal mood and affect. behavior is normal. Judgment and thought content normal.  Recent Results (from the past 2160 hour(s))  Lipid panel     Status: Abnormal   Collection Time: 12/24/20  8:15 AM  Result Value Ref Range   Cholesterol, Total 256 (H) 100 - 199 mg/dL   Triglycerides 100 0 - 149 mg/dL   HDL 93 >39 mg/dL   VLDL Cholesterol Cal 17 5 - 40 mg/dL   LDL Chol Calc (NIH) 146 (H) 0 - 99 mg/dL   Chol/HDL Ratio 2.8 0.0 - 4.4 ratio    Comment:                                   T. Chol/HDL Ratio                                             Men  Women                               1/2 Avg.Risk  3.4    3.3                                   Avg.Risk  5.0    4.4                                2X Avg.Risk  9.6    7.1                                3X Avg.Risk 23.4   11.0   Comprehensive metabolic panel     Status: None   Collection Time: 12/24/20   8:15 AM  Result Value Ref Range   Glucose 82 65 - 99 mg/dL   BUN 10 6 - 24 mg/dL   Creatinine, Ser 0.77 0.57 - 1.00 mg/dL   eGFR 92 >59 mL/min/1.73   BUN/Creatinine Ratio 13 9 - 23   Sodium 143 134 - 144 mmol/L   Potassium 4.6 3.5 - 5.2 mmol/L   Chloride 103 96 - 106 mmol/L   CO2 23 20 - 29 mmol/L   Calcium 9.4 8.7 - 10.2 mg/dL   Total Protein 6.7 6.0 - 8.5 g/dL   Albumin 4.5 3.8 - 4.9 g/dL   Globulin, Total 2.2 1.5 - 4.5 g/dL   Albumin/Globulin Ratio 2.0 1.2 - 2.2   Bilirubin Total 0.8 0.0 - 1.2 mg/dL   Alkaline Phosphatase 94 44 - 121 IU/L   AST 17 0 - 40 IU/L   ALT 16 0 - 32 IU/L  CBC with Differential/Platelet     Status: Abnormal   Collection Time: 12/24/20  8:15 AM  Result Value Ref Range   WBC 5.1 3.4 - 10.8 x10E3/uL   RBC 4.65 3.77 - 5.28 x10E6/uL     Hemoglobin 14.6 11.1 - 15.9 g/dL   Hematocrit 42.8 34.0 - 46.6 %   MCV 92 79 - 97 fL   MCH 31.4 26.6 - 33.0 pg   MCHC 34.1 31.5 - 35.7 g/dL   RDW 11.0 (L) 11.7 - 15.4 %   Platelets 290 150 - 450 x10E3/uL   Neutrophils 60 Not Estab. %   Lymphs 27 Not Estab. %   Monocytes 7 Not Estab. %   Eos 5 Not Estab. %   Basos 1 Not Estab. %   Neutrophils Absolute 3.0 1.4 - 7.0 x10E3/uL   Lymphocytes Absolute 1.4 0.7 - 3.1 x10E3/uL   Monocytes Absolute 0.4 0.1 - 0.9 x10E3/uL   EOS (ABSOLUTE) 0.3 0.0 - 0.4 x10E3/uL   Basophils Absolute 0.1 0.0 - 0.2 x10E3/uL   Immature Granulocytes 0 Not Estab. %   Immature Grans (Abs) 0.0 0.0 - 0.1 x10E3/uL  VITAMIN D 25 Hydroxy (Vit-D Deficiency, Fractures)     Status: Abnormal   Collection Time: 12/24/20  8:15 AM  Result Value Ref Range   Vit D, 25-Hydroxy 29.2 (L) 30.0 - 100.0 ng/mL    Comment: Vitamin D deficiency has been defined by the Institute of Medicine and an Endocrine Society practice guideline as a level of serum 25-OH vitamin D less than 20 ng/mL (1,2). The Endocrine Society went on to further define vitamin D insufficiency as a level between 21 and 29 ng/mL (2). 1. IOM  (Institute of Medicine). 2010. Dietary reference    intakes for calcium and D. [place]: The    [employer]. 2. Holick MF, Binkley [place], Bischoff-Ferrari HA, et al.    Evaluation, treatment, and prevention of vitamin D    deficiency: an Endocrine Society clinical practice    guideline. JCEM. 2011 Jul; 96(7):1911-30.   Vitamin B12     Status: None   Collection Time: 12/24/20  8:15 AM  Result Value Ref Range   Vitamin B-12 385 232 - 1,245 pg/mL      PHQ2/9: Depression screen [facility] 2/9 01/17/2021 12/06/2020 07/18/2020 01/17/2020 08/02/2019  Decreased Interest 0 0 0 0 0  Down, Depressed, Hopeless 0 0 0 0 0  PHQ - 2 Score 0 0 0 0 0  Altered sleeping - 0 0 3 0  Tired, decreased energy - 0 1 0 0  Change in appetite - 0 1 0 0  Feeling bad or failure about yourself  - 0 0 0 0  Trouble concentrating - 0 0 0 0  Moving slowly or fidgety/restless - 0 1 0 0  Suicidal thoughts - 0 0 0 0  PHQ-9 Score - 0 3 3 0  Difficult doing work/chores - Not difficult at all - Not difficult at all Not difficult at all  Some recent data might be hidden    phq 9 is negative   Fall Risk: Fall Risk  01/17/2021 12/06/2020 07/18/2020 01/17/2020 08/02/2019  Falls in the past year? 0 1 1 0 1  Comment - - - - -  Number falls in past yr: 0 0 0 - 0  Injury with Fall? 0 1 0 - 0  Comment - - - - -  Follow up - - - - -     Functional Status Survey: Is the patient deaf or have difficulty hearing?: No Does the patient have difficulty seeing, even when wearing glasses/contacts?: No Does the patient have difficulty concentrating, remembering, or making decisions?: No Does the patient have difficulty walking or climbing stairs?: Yes Does  the patient have difficulty dressing or bathing?: No Does the patient have difficulty doing errands alone such as visiting a doctor's office or shopping?: No    Assessment & Plan  1. B12 deficiency   2. Dyslipidemia  Discussed healthier diet   3. Vitamin D  deficiency   4. Dysmetabolic syndrome  - metFORMIN (GLUCOPHAGE-XR) 500 MG 24 hr tablet; Take 1 tablet (500 mg total) by mouth daily with breakfast.  Dispense: 90 tablet; Refill: 1  5. Depression, major, recurrent, mild (HCC)  - DULoxetine (CYMBALTA) 60 MG capsule; Take 1 capsule (60 mg total) by mouth daily.  Dispense: 90 capsule; Refill: 1  6. Bariatric surgery status  - metFORMIN (GLUCOPHAGE-XR) 500 MG 24 hr tablet; Take 1 tablet (500 mg total) by mouth daily with breakfast.  Dispense: 90 tablet; Refill: 1  7. Left lumbar radiculitis   8. Chronic midline low back pain with left-sided sciatica  - pregabalin (LYRICA) 75 MG capsule; Take 1 capsule (75 mg total) by mouth 3 (three) times daily.  Dispense: 90 capsule; Refill: 0 - DULoxetine (CYMBALTA) 60 MG capsule; Take 1 capsule (60 mg total) by mouth daily.  Dispense: 90 capsule; Refill: 1 - metaxalone (SKELAXIN) 800 MG tablet; Take 1 tablet (800 mg total) by mouth 3 (three) times daily as needed for muscle spasms.  Dispense: 90 tablet; Refill: 0

## 2021-01-17 NOTE — Patient Instructions (Signed)
We can switch from Skelaxin to Robaxin if too expensive. Ask pharmacist if they have coupons or GoodRx

## 2021-01-18 ENCOUNTER — Other Ambulatory Visit: Payer: Self-pay | Admitting: Family Medicine

## 2021-01-18 MED ORDER — METHOCARBAMOL 500 MG PO TABS: 500.0000 mg | ORAL_TABLET | Freq: Three times a day (TID) | ORAL | 0 refills | Status: DC | PRN

## 2021-03-16 ENCOUNTER — Other Ambulatory Visit: Payer: Self-pay | Admitting: Family Medicine

## 2021-03-16 DIAGNOSIS — M5416 Radiculopathy, lumbar region: Secondary | ICD-10-CM

## 2021-03-16 NOTE — Telephone Encounter (Signed)
dc'd 01/17/21 change in therapy Dr Ancil Boozer

## 2021-03-28 ENCOUNTER — Other Ambulatory Visit: Payer: Self-pay | Admitting: Family Medicine

## 2021-03-28 DIAGNOSIS — G8929 Other chronic pain: Secondary | ICD-10-CM

## 2021-03-28 DIAGNOSIS — M5442 Lumbago with sciatica, left side: Secondary | ICD-10-CM

## 2021-03-28 MED ORDER — PREGABALIN 75 MG PO CAPS: 75.0000 mg | ORAL_CAPSULE | Freq: Three times a day (TID) | ORAL | 0 refills | Status: DC

## 2021-03-28 NOTE — Telephone Encounter (Signed)
Copied from [place] [phone]. Topic: Quick Communication - Rx Refill/Question >> Mar 28, 2021  8:40 AM Tessa Lerner A wrote: Medication: pregabalin (LYRICA) 75 MG capsule  Has the patient contacted their pharmacy? Yes.   (Agent: If no, request that the patient contact the pharmacy for the refill.) (Agent: If yes, when and what did the pharmacy advise?)  Preferred Pharmacy (with phone number or street name): [facility] [place], [place] - [address] AT [address]  Phone:  [phone] Fax:  [phone]  Agent: Please be advised that RX refills may take up to 3 business days. We ask that you follow-up with your pharmacy.

## 2021-03-28 NOTE — Telephone Encounter (Signed)
Last seen 6.2.2022 upcoming sch'd for 11.3.2022

## 2021-03-28 NOTE — Telephone Encounter (Signed)
Requested medication (s) are due for refill today:  no  Requested medication (s) are on the active medication list: yes   Last refill:  01/17/2021  Future visit scheduled: yes  Notes to clinic:  this refill cannot be delegated    Requested Prescriptions  Pending Prescriptions Disp Refills   pregabalin (LYRICA) 75 MG capsule 90 capsule 0    Sig: Take 1 capsule (75 mg total) by mouth 3 (three) times daily.     Not Delegated - Neurology:  Anticonvulsants - Controlled Failed - 03/28/2021  8:51 AM      Failed - This refill cannot be delegated      Passed - Valid encounter within last 12 months    Recent Outpatient Visits           2 months ago Chronic midline low back pain with left-sided sciatica   [facility] Steele Sizer, MD   3 months ago Acute midline low back pain with left-sided sciatica   [facility] Just, Laurita Quint, FNP   8 months ago Vitamin D deficiency   [facility] Steele Sizer, MD   1 year ago Depression, major, recurrent, mild [facility])   [facility] Steele Sizer, MD   1 year ago Dysmetabolic syndrome   [facility] Steele Sizer, MD       Future Appointments             In 2 months Ancil Boozer, Drue Stager, MD [facility], [place]   In 3 months Ralene Bathe, MD [place]   In 3 months Steele Sizer, MD [facility], [facility]

## 2021-04-03 ENCOUNTER — Encounter: Payer: Self-pay | Admitting: Oncology

## 2021-05-02 ENCOUNTER — Other Ambulatory Visit: Payer: Self-pay | Admitting: Family Medicine

## 2021-05-02 DIAGNOSIS — G8929 Other chronic pain: Secondary | ICD-10-CM

## 2021-05-02 DIAGNOSIS — M5442 Lumbago with sciatica, left side: Secondary | ICD-10-CM

## 2021-05-03 MED ORDER — METHOCARBAMOL 500 MG PO TABS: 500.0000 mg | ORAL_TABLET | Freq: Three times a day (TID) | ORAL | 0 refills | Status: DC | PRN

## 2021-05-03 MED ORDER — PREGABALIN 75 MG PO CAPS: 75.0000 mg | ORAL_CAPSULE | Freq: Three times a day (TID) | ORAL | 0 refills | Status: DC

## 2021-05-21 ENCOUNTER — Other Ambulatory Visit: Payer: Self-pay | Admitting: Dermatology

## 2021-06-19 NOTE — Progress Notes (Signed)
Name: Ashlee Cruz   MRN: [id number]    DOB: 02-Jul-1966   Date:06/20/2021       Progress Note  Subjective  Chief Complaint  Follow up   HPI  Pre-diabetes: last A1C was 5.7%, she is due for repeat labs, she has been more active lately, she denies polyphagia, polydipsia or polyphagia. She is on Metformin and denies side effects of medication . We will recheck labs today    History of bariatric surgery: she has a history of iron deficiency anemia, she is taking MVI, her weight prior to surgery was 236 lbs, her weight went down to mid 130's, it was stable in the 170 lbs range and today is down to 166 lbs  She tries to have a balanced diet.    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 Consuella Lose  in July 2017 and was doing great, but a few months after the surgery she had a flare. She was seen by Dr. Kathyrn Sheriff and had steroid injection end of November 2017 and symptoms improved a little, she had another injection January 2018. She has been managing with Tylenol and Flexeril, she has mild intermittent flares, last episode that required prednisone was 11/2020, Currently out of Lyrica and has not noticed much of a difference without it, she states not radiculitis lately, but still daily low back pain and takes Robaxin at least at night sometimes twice daily. Symptoms stable, she will try staying off Lyrica and take if prn flares    Major Depression Remission: she is now on Cymbalta to help with pain and mood.  She is helping her mother move and is going to [facility] to help her pack every weekend. She states that they are packing the house where she grew up since age 39 years of age.    Palpitation: she has intermittent symptoms, occasionally gets dizzy when she gets up quickly, but no chest pain    B12 and vitamin D deficiency: last B12 level was towards low end of normal, advised SL B12 ,  vitamin D almost 30, continue supplementation    Eczema: both hands ,  well controlled with medication, sees Dr. Nehemiah Massed . Stable   The 10-year ASCVD risk score (Arnett DK, et al., 2019) is: 1.1%   Values used to calculate the score:     Age: 29 years     Sex: Female     Is Non-Hispanic African American: No     Diabetic: No     Tobacco smoker: No     Systolic Blood Pressure: 814 mmHg     Is BP treated: No     HDL Cholesterol: 93 mg/dL     Total Cholesterol: 256 mg/dL    Patient Active Problem List   Diagnosis Date Noted   Iron deficiency anemia 05/02/2020   History of iron deficiency 01/11/2019   Tachycardia 05/19/2017   Palpitations 05/19/2017   Fibroid uterus 12/20/2015   History of iron deficiency anemia 10/23/2015   Left lumbar radiculitis 10/23/2015   Absence of menstruation 04/26/2015   B12 deficiency 04/26/2015   Depression, major, recurrent, mild ([place]) 04/26/2015   Dyslipidemia 48/18/5631   Dysmetabolic syndrome 49/70/2637   Bariatric surgery status 04/26/2015   H/O: HTN (hypertension) 04/26/2015   Psoriasis 04/26/2015   Allergic rhinitis, seasonal 04/26/2015   Vitamin D deficiency 04/26/2015   Bilateral knee pain 04/26/2015    Past Surgical History:  Procedure Laterality Date   BARIATRIC  SURGERY     CHOLECYSTECTOMY     COLONOSCOPY WITH PROPOFOL N/A 05/07/2020   Procedure: COLONOSCOPY WITH PROPOFOL;  Surgeon: Virgel Manifold, MD;  Location: [place];  Service: Endoscopy;  Laterality: N/A;   DILATION AND CURETTAGE OF UTERUS     ESOPHAGOGASTRODUODENOSCOPY (EGD) WITH PROPOFOL N/A 05/07/2020   Procedure: ESOPHAGOGASTRODUODENOSCOPY (EGD) WITH BIOPSY;  Surgeon: Virgel Manifold, MD;  Location: [place];  Service: Endoscopy;  Laterality: N/A;   LUMBAR SPINE SURGERY Left 02/2016   Pt reports lumbar surgery without placement of instrumentation to decompress nerve on left side.     Family History  Problem Relation Age of Onset   Diabetes Mother    Skin cancer Mother    Diabetes Father    COPD Father     Seizures Father    Heart disease Father 21       CABG   Diabetes Brother    Multiple sclerosis Brother    Stroke Paternal Grandmother     Social History   Tobacco Use   Smoking status: Never   Smokeless tobacco: Never  Substance Use Topics   Alcohol use: No    Alcohol/week: 0.0 standard drinks     Current Outpatient Medications:    acetaminophen (TYLENOL) 500 MG tablet, Take 1,000 mg by mouth as needed for moderate pain., Disp: , Rfl:    BIOTIN 5000 PO, Take 1 Dose by mouth daily. , Disp: , Rfl:    cholecalciferol (VITAMIN D3) 25 MCG (1000 UT) tablet, Take 2,000 Units by mouth daily., Disp: , Rfl:    DULoxetine (CYMBALTA) 60 MG capsule, Take 1 capsule (60 mg total) by mouth daily., Disp: 90 capsule, Rfl: 1   metFORMIN (GLUCOPHAGE-XR) 500 MG 24 hr tablet, Take 1 tablet (500 mg total) by mouth daily with breakfast., Disp: 90 tablet, Rfl: 1   methocarbamol (ROBAXIN) 500 MG tablet, Take 1 tablet (500 mg total) by mouth every 8 (eight) hours as needed for muscle spasms., Disp: 90 tablet, Rfl: 0   Multiple Vitamin (MULTIVITAMIN) tablet, Take 1 tablet by mouth daily. , Disp: , Rfl:    OTEZLA 30 MG TABS, TAKE 1 TABLET BY MOUTH  TWICE DAILY, Disp: 60 tablet, Rfl: 5   pregabalin (LYRICA) 75 MG capsule, Take 1 capsule (75 mg total) by mouth 3 (three) times daily., Disp: 90 capsule, Rfl: 0  Allergies  Allergen Reactions   Other Other (See Comments)    Any fragrant smell sets her off   Latex     Pt reports prolonged exposure causes aggravation in psoriasis   Codeine Rash    I personally reviewed active problem list, medication list, allergies, family history, social history, health maintenance with the patient/caregiver today.   ROS  Constitutional: Negative for fever or weight change.  Respiratory: Negative for cough and shortness of breath.   Cardiovascular: Negative for chest pain or palpitations.  Gastrointestinal: Negative for abdominal pain, no bowel changes.   Musculoskeletal: Negative for gait problem or joint swelling.  Skin: Negative for rash.  Neurological: Negative for dizziness or headache.  No other specific complaints in a complete review of systems (except as listed in HPI above).   Objective  Vitals:   06/20/21 0811  BP: 112/68  Pulse: 95  Resp: 16  Temp: 98.4 F (36.9 C)  SpO2: 98%  Weight: 166 lb (75.3 kg)  Height: 5\' 1"  (1.549 m)    Body mass index is 31.37 kg/m.  Physical Exam  Constitutional: Patient appears well-developed  and well-nourished. Obese  No distress.  HEENT: head atraumatic, normocephalic, pupils equal and reactive to light, neck supple Cardiovascular: Normal rate, regular rhythm and normal heart sounds.  No murmur heard. No BLE edema. Pulmonary/Chest: Effort normal and breath sounds normal. No respiratory distress. Abdominal: Soft.  There is no tenderness. Muscular Skeletal: mild tenderness during palpation of right lower back, negative straight leg raise  Psychiatric: Patient has a normal mood and affect. behavior is normal. Judgment and thought content normal.   PHQ2/9: Depression screen [facility] 2/9 06/20/2021 01/17/2021 12/06/2020 07/18/2020 01/17/2020  Decreased Interest 0 0 0 0 0  Down, Depressed, Hopeless 0 0 0 0 0  PHQ - 2 Score 0 0 0 0 0  Altered sleeping 0 - 0 0 3  Tired, decreased energy 0 - 0 1 0  Change in appetite 0 - 0 1 0  Feeling bad or failure about yourself  0 - 0 0 0  Trouble concentrating 0 - 0 0 0  Moving slowly or fidgety/restless 0 - 0 1 0  Suicidal thoughts 0 - 0 0 0  PHQ-9 Score 0 - 0 3 3  Difficult doing work/chores - - Not difficult at all - Not difficult at all  Some recent data might be hidden    phq 9 is negative   Fall Risk: Fall Risk  06/20/2021 01/17/2021 12/06/2020 07/18/2020 01/17/2020  Falls in the past year? 0 0 1 1 0  Comment - - - - -  Number falls in past yr: 0 0 0 0 -  Injury with Fall? 0 0 1 0 -  Comment - - - - -  Risk for fall due to : No Fall Risks - - - -   Follow up Falls prevention discussed - - - -      Functional Status Survey: Is the patient deaf or have difficulty hearing?: No Does the patient have difficulty seeing, even when wearing glasses/contacts?: No Does the patient have difficulty concentrating, remembering, or making decisions?: No Does the patient have difficulty walking or climbing stairs?: No Does the patient have difficulty dressing or bathing?: No Does the patient have difficulty doing errands alone such as visiting a doctor's office or shopping?: No    Assessment & Plan  1. Major depression in remission ([place])   2. Need for immunization against influenza  - Flu Vaccine QUAD 6+ mos PF IM (Fluarix Quad PF)  3. B12 deficiency   4. Dyslipidemia  - Lipid panel  5. Vitamin D deficiency   6. Bariatric surgery status  - metFORMIN (GLUCOPHAGE-XR) 500 MG 24 hr tablet; Take 1 tablet (500 mg total) by mouth daily with breakfast.  Dispense: 90 tablet; Refill: 1 - Comprehensive metabolic panel  7. Dysmetabolic syndrome  - metFORMIN (GLUCOPHAGE-XR) 500 MG 24 hr tablet; Take 1 tablet (500 mg total) by mouth daily with breakfast.  Dispense: 90 tablet; Refill: 1 - Hemoglobin A1c  8. Chronic midline low back pain with left-sided sciatica  - DULoxetine (CYMBALTA) 60 MG capsule; Take 1 capsule (60 mg total) by mouth daily.  Dispense: 90 capsule; Refill: 1 - pregabalin (LYRICA) 75 MG capsule; Take 1 capsule (75 mg total) by mouth 3 (three) times daily.  Dispense: 270 capsule; Refill: 1  9. Long-term use of high-risk medication  - Comprehensive metabolic panel  10. Depression, major, recurrent, mild (H[facility])  - DULoxetine (CYMBALTA) 60 MG capsule; Take 1 capsule (60 mg total) by mouth daily.  Dispense: 90 capsule; Refill: 1

## 2021-06-20 ENCOUNTER — Ambulatory Visit: Payer: Managed Care, Other (non HMO) | Admitting: Family Medicine

## 2021-06-20 ENCOUNTER — Encounter: Payer: Self-pay | Admitting: Family Medicine

## 2021-06-20 ENCOUNTER — Other Ambulatory Visit: Payer: Self-pay

## 2021-06-20 VITALS — BP 112/68 | HR 95 | Temp 98.4°F | Resp 16 | Ht 61.0 in | Wt 166.0 lb

## 2021-06-20 DIAGNOSIS — E785 Hyperlipidemia, unspecified: Secondary | ICD-10-CM

## 2021-06-20 DIAGNOSIS — M5442 Lumbago with sciatica, left side: Secondary | ICD-10-CM

## 2021-06-20 DIAGNOSIS — Z23 Encounter for immunization: Secondary | ICD-10-CM | POA: Diagnosis not present

## 2021-06-20 DIAGNOSIS — Z79899 Other long term (current) drug therapy: Secondary | ICD-10-CM

## 2021-06-20 DIAGNOSIS — E559 Vitamin D deficiency, unspecified: Secondary | ICD-10-CM

## 2021-06-20 DIAGNOSIS — Z9884 Bariatric surgery status: Secondary | ICD-10-CM

## 2021-06-20 DIAGNOSIS — F330 Major depressive disorder, recurrent, mild: Secondary | ICD-10-CM

## 2021-06-20 DIAGNOSIS — E538 Deficiency of other specified B group vitamins: Secondary | ICD-10-CM

## 2021-06-20 DIAGNOSIS — F325 Major depressive disorder, single episode, in full remission: Secondary | ICD-10-CM | POA: Diagnosis not present

## 2021-06-20 DIAGNOSIS — E8881 Metabolic syndrome: Secondary | ICD-10-CM

## 2021-06-20 DIAGNOSIS — G8929 Other chronic pain: Secondary | ICD-10-CM

## 2021-06-20 MED ORDER — METFORMIN HCL ER 500 MG PO TB24: 500.0000 mg | ORAL_TABLET | Freq: Every day | ORAL | 1 refills | Status: DC

## 2021-06-20 MED ORDER — METHOCARBAMOL 500 MG PO TABS
500.0000 mg | ORAL_TABLET | Freq: Two times a day (BID) | ORAL | 1 refills | Status: DC | PRN
Start: 2021-06-20 — End: 2022-06-27

## 2021-06-20 MED ORDER — PREGABALIN 75 MG PO CAPS: 75.0000 mg | ORAL_CAPSULE | Freq: Three times a day (TID) | ORAL | 1 refills | Status: DC

## 2021-06-20 MED ORDER — DULOXETINE HCL 60 MG PO CPEP
60.0000 mg | ORAL_CAPSULE | Freq: Every day | ORAL | 1 refills | Status: DC
Start: 2021-06-20 — End: 2022-02-20

## 2021-07-17 ENCOUNTER — Ambulatory Visit: Payer: Managed Care, Other (non HMO) | Admitting: Dermatology

## 2021-07-22 ENCOUNTER — Encounter: Payer: Managed Care, Other (non HMO) | Admitting: Family Medicine

## 2021-07-25 ENCOUNTER — Encounter: Payer: Self-pay | Admitting: Family Medicine

## 2021-07-26 ENCOUNTER — Ambulatory Visit: Payer: Self-pay | Admitting: *Deleted

## 2021-07-26 ENCOUNTER — Telehealth: Payer: Managed Care, Other (non HMO) | Admitting: Emergency Medicine

## 2021-07-26 DIAGNOSIS — U071 COVID-19: Secondary | ICD-10-CM

## 2021-07-26 MED ORDER — ONDANSETRON HCL 4 MG PO TABS: 4.0000 mg | ORAL_TABLET | Freq: Three times a day (TID) | ORAL | 0 refills | Status: DC | PRN

## 2021-07-26 MED ORDER — BENZONATATE 100 MG PO CAPS: 100.0000 mg | ORAL_CAPSULE | Freq: Two times a day (BID) | ORAL | 0 refills | Status: DC | PRN

## 2021-07-26 MED ORDER — MOLNUPIRAVIR EUA 200MG CAPSULE: 4.0000 | ORAL_CAPSULE | Freq: Two times a day (BID) | ORAL | 0 refills | Status: AC

## 2021-07-26 NOTE — Progress Notes (Signed)
Virtual Visit Consent   Ashlee Cruz, you are scheduled for a virtual visit with a [name] provider today.     Just as with appointments in the office, your consent must be obtained to participate.  Your consent will be active for this visit and any virtual visit you may have with one of our providers in the next 365 days.     If you have a MyChart account, a copy of this consent can be sent to you electronically.  All virtual visits are billed to your insurance company just like a traditional visit in the office.    As this is a virtual visit, video technology does not allow for your provider to perform a traditional examination.  This may limit your provider's ability to fully assess your condition.  If your provider identifies any concerns that need to be evaluated in person or the need to arrange testing (such as labs, EKG, etc.), we will make arrangements to do so.     Although advances in technology are sophisticated, we cannot ensure that it will always work on either your end or our end.  If the connection with a video visit is poor, the visit may have to be switched to a telephone visit.  With either a video or telephone visit, we are not always able to ensure that we have a secure connection.     I need to obtain your verbal consent now.   Are you willing to proceed with your visit today? Yes   [name] has provided verbal consent on 07/26/2021 for a virtual visit (video or telephone).   Ashlee Cruz, [place]   Date: 07/26/2021 5:06 PM   Virtual Visit via Video Note   I, Ashlee Cruz, connected with  Ashlee Cruz  ([id number], 04/23/1966) on 07/26/21 at  5:15 PM EST by a video-enabled telemedicine application and verified that I am speaking with the correct person using two identifiers.  Location: Patient: Virtual Visit Location Patient: Home Provider: Virtual Visit Location Provider: Home Office   I discussed the limitations of evaluation and management by  telemedicine and the availability of in person appointments. The patient expressed understanding and agreed to proceed.    History of Present Illness: Ashlee Cruz is a 55 y.o. who identifies as a female who was assigned female at birth, and is being seen today for sore throat, fatigue, joint pain, congestion, cough, loss of appetite x 2 days.  Denies sick exposure to COVID, flu or strep. Has tried cold/ flu medication without relief.  Symptoms are made worse with morning and in the evening.  Denies previous covid infection in the past.   Denies fever, SOB, wheezing, chest pain, nausea, vomiting, changes in bowel or bladder habits.     ROS: As per HPI.  All other pertinent ROS negative.     HPI: HPI  Problems:  Patient Active Problem List   Diagnosis Date Noted   Iron deficiency anemia 05/02/2020   History of iron deficiency 01/11/2019   Tachycardia 05/19/2017   Palpitations 05/19/2017   Fibroid uterus 12/20/2015   History of iron deficiency anemia 10/23/2015   Left lumbar radiculitis 10/23/2015   Absence of menstruation 04/26/2015   B12 deficiency 04/26/2015   Depression, major, recurrent, mild ([place]) 04/26/2015   Dyslipidemia 34/74/2595   Dysmetabolic syndrome 63/87/5643   Bariatric surgery status 04/26/2015   H/O: HTN (hypertension) 04/26/2015   Psoriasis 04/26/2015   Allergic rhinitis, seasonal 04/26/2015   Vitamin D  deficiency 04/26/2015   Bilateral knee pain 04/26/2015    Allergies:  Allergies  Allergen Reactions   Other Other (See Comments)    Any fragrant smell sets her off   Latex     Pt reports prolonged exposure causes aggravation in psoriasis   Codeine Rash   Medications:  Current Outpatient Medications:    benzonatate (TESSALON) 100 MG capsule, Take 1 capsule (100 mg total) by mouth 2 (two) times daily as needed for cough., Disp: 20 capsule, Rfl: 0   molnupiravir EUA (LAGEVRIO) 200 mg CAPS capsule, Take 4 capsules (800 mg total) by mouth 2 (two) times daily for  5 days., Disp: 40 capsule, Rfl: 0   ondansetron (ZOFRAN) 4 MG tablet, Take 1 tablet (4 mg total) by mouth every 8 (eight) hours as needed for nausea or vomiting., Disp: 20 tablet, Rfl: 0   acetaminophen (TYLENOL) 500 MG tablet, Take 1,000 mg by mouth as needed for moderate pain., Disp: , Rfl:    BIOTIN 5000 PO, Take 1 Dose by mouth daily. , Disp: , Rfl:    cholecalciferol (VITAMIN D3) 25 MCG (1000 UT) tablet, Take 2,000 Units by mouth daily., Disp: , Rfl:    DULoxetine (CYMBALTA) 60 MG capsule, Take 1 capsule (60 mg total) by mouth daily., Disp: 90 capsule, Rfl: 1   metFORMIN (GLUCOPHAGE-XR) 500 MG 24 hr tablet, Take 1 tablet (500 mg total) by mouth daily with breakfast., Disp: 90 tablet, Rfl: 1   methocarbamol (ROBAXIN) 500 MG tablet, Take 1 tablet (500 mg total) by mouth 2 (two) times daily as needed for muscle spasms., Disp: 180 tablet, Rfl: 1   Multiple Vitamin (MULTIVITAMIN) tablet, Take 1 tablet by mouth daily. , Disp: , Rfl:    OTEZLA 30 MG TABS, TAKE 1 TABLET BY MOUTH  TWICE DAILY, Disp: 60 tablet, Rfl: 5   pregabalin (LYRICA) 75 MG capsule, Take 1 capsule (75 mg total) by mouth 3 (three) times daily., Disp: 270 capsule, Rfl: 1  Observations/Objective: Patient is well-developed, well-nourished in no acute distress.  Resting comfortably  at home. Fatigued appearing, but nontoxic Head is normocephalic, atraumatic.  No labored breathing. Speaking in full sentences and tolerating own secretions Speech is clear and coherent with logical content.  Patient is alert and oriented at baseline.    Assessment and Plan: 1. COVID-19 virus infection   COVID testing ordered.  It will take between 3-5 days for test results.  Someone will contact you regarding abnormal results.   In the meantime: You should remain isolated in your home for 5 days from symptom onset AND greater than 72 hours after symptoms resolution (absence of fever without the use of fever-reducing medication and improvement in  respiratory symptoms), whichever is longer Get plenty of rest and push fluids Tessalon Perles prescribed for cough Use OTC zyrtec for nasal congestion, runny nose, and/or sore throat Use OTC flonase for nasal congestion and runny nose Use medications daily for symptom relief Use OTC medications like ibuprofen or tylenol as needed fever or pain Follow up in person with urgent care or go to the ED if you have any new or worsening symptoms such as fever, worsening cough, shortness of breath, chest tightness, chest pain, turning blue, changes in mental status, etc...   Follow Up Instructions: I discussed the assessment and treatment plan with the patient. The patient was provided an opportunity to ask questions and all were answered. The patient agreed with the plan and demonstrated an understanding of the instructions.  A  copy of instructions were sent to the patient via MyChart unless otherwise noted below.     The patient was advised to call back or seek an in-person evaluation if the symptoms worsen or if the condition fails to improve as anticipated.  Time:  I spent 10 minutes with the patient via telehealth technology discussing the above problems/concerns.    Ashlee Box, PA-C

## 2021-07-26 NOTE — Telephone Encounter (Signed)
Pt is covid positive. Sx started 2 days ago. Would like advice and any meds she can get.  No virtual appts   Positive covid at home test 07/25/21. Sx cough, headache, nasal congestion, muscle aches, brain fog x 2 days ago . Requesting medication for sx. Patient concerned due to husband with immunocompromised state and receiving at home medications. No VV appt available recommended E visit via My Chart . Reviewed isolation precautions. Care advise given. Patient verbalized understanding of care advise and to call back or go to [facility] or ED if symptoms worsen. Please advise .

## 2021-07-26 NOTE — Patient Instructions (Signed)
Bobette Mo, thank you for joining Lestine Box, PA-C for today's virtual visit.  While this provider is not your primary care provider (PCP), if your PCP is located in our provider database this encounter information will be shared with them immediately following your visit.  Consent: (Patient) Ashlee Cruz provided verbal consent for this virtual visit at the beginning of the encounter.  Current Medications:  Current Outpatient Medications:    acetaminophen (TYLENOL) 500 MG tablet, Take 1,000 mg by mouth as needed for moderate pain., Disp: , Rfl:    BIOTIN 5000 PO, Take 1 Dose by mouth daily. , Disp: , Rfl:    cholecalciferol (VITAMIN D3) 25 MCG (1000 UT) tablet, Take 2,000 Units by mouth daily., Disp: , Rfl:    DULoxetine (CYMBALTA) 60 MG capsule, Take 1 capsule (60 mg total) by mouth daily., Disp: 90 capsule, Rfl: 1   metFORMIN (GLUCOPHAGE-XR) 500 MG 24 hr tablet, Take 1 tablet (500 mg total) by mouth daily with breakfast., Disp: 90 tablet, Rfl: 1   methocarbamol (ROBAXIN) 500 MG tablet, Take 1 tablet (500 mg total) by mouth 2 (two) times daily as needed for muscle spasms., Disp: 180 tablet, Rfl: 1   Multiple Vitamin (MULTIVITAMIN) tablet, Take 1 tablet by mouth daily. , Disp: , Rfl:    OTEZLA 30 MG TABS, TAKE 1 TABLET BY MOUTH  TWICE DAILY, Disp: 60 tablet, Rfl: 5   pregabalin (LYRICA) 75 MG capsule, Take 1 capsule (75 mg total) by mouth 3 (three) times daily., Disp: 270 capsule, Rfl: 1   Medications ordered in this encounter:  No orders of the defined types were placed in this encounter.    *If you need refills on other medications prior to your next appointment, please contact your pharmacy*  Follow-Up: Call back or seek an in-person evaluation if the symptoms worsen or if the condition fails to improve as anticipated.  Other Instructions   COVID testing ordered.  It will take between 3-5 days for test results.  Someone will contact you regarding abnormal results.   In  the meantime: You should remain isolated in your home for 5 days from symptom onset AND greater than 72 hours after symptoms resolution (absence of fever without the use of fever-reducing medication and improvement in respiratory symptoms), whichever is longer Get plenty of rest and push fluids Tessalon Perles prescribed for cough Use OTC zyrtec for nasal congestion, runny nose, and/or sore throat Use OTC flonase for nasal congestion and runny nose Use medications daily for symptom relief Use OTC medications like ibuprofen or tylenol as needed fever or pain Follow up in person with urgent care or go to the ED if you have any new or worsening symptoms such as fever, worsening cough, shortness of breath, chest tightness, chest pain, turning blue, changes in mental status, etc...    If you have been instructed to have an in-person evaluation today at a local Urgent Care facility, please use the link below. It will take you to a list of all of our available [facility], including address, phone number and hours of operation. Please do not delay care.  [facility]  If you or a family member do not have a primary care provider, use the link below to schedule a visit and establish care. When you choose a [facility] primary care physician or advanced practice provider, you gain a long-term partner in health. Find a Primary Care Provider  Learn more about [facility]'s in-office  and virtual care options: [facility]

## 2021-07-26 NOTE — Telephone Encounter (Signed)
Reason for Disposition  [1] COVID-19 diagnosed by positive lab test (e.g., PCR, rapid self-test kit) AND [2] mild symptoms (e.g., cough, fever, others) AND [1] no complications or SOB  Answer Assessment - Initial Assessment Questions 1. COVID-19 DIAGNOSIS: "Who made your COVID-19 diagnosis?" "Was it confirmed by a positive lab test or self-test?" If not diagnosed by a doctor (or NP/PA), ask "Are there lots of cases (community spread) where you live?" Note: See public health department website, if unsure.     At home test positive yesterday 07/25/21 2. COVID-19 EXPOSURE: "Was there any known exposure to COVID before the symptoms began?" CDC Definition of close contact: within 6 feet (2 meters) for a total of 15 minutes or more over a 24-hour period.      no 3. ONSET: "When did the COVID-19 symptoms start?"      2 days ago 07/24/21 4. WORST SYMPTOM: "What is your worst symptom?" (e.g., cough, fever, shortness of breath, muscle aches)     Nasal congestion, cough, headache muscle aches, brain fog 5. COUGH: "Do you have a cough?" If Yes, ask: "How bad is the cough?"       Yes  6. FEVER: "Do you have a fever?" If Yes, ask: "What is your temperature, how was it measured, and when did it start?"     no 7. RESPIRATORY STATUS: "Describe your breathing?" (e.g., shortness of breath, wheezing, unable to speak)      no 8. BETTER-SAME-WORSE: "Are you getting better, staying the same or getting worse compared to yesterday?"  If getting worse, ask, "In what way?"     A little better  9. HIGH RISK DISEASE: "Do you have any chronic medical problems?" (e.g., asthma, heart or lung disease, weak immune system, obesity, etc.)     Na  10. VACCINE: "Have you had the COVID-19 vaccine?" If Yes, ask: "Which one, how many shots, when did you get it?"       X 2 Pfizer  11. BOOSTER: "Have you received your COVID-19 booster?" If Yes, ask: "Which one and when did you get it?"       na 12. PREGNANCY: "Is there any chance you  are pregnant?" "When was your last menstrual period?"       na 13. OTHER SYMPTOMS: "Do you have any other symptoms?"  (e.g., chills, fatigue, headache, loss of smell or taste, muscle pain, sore throat)       Headache, cough, nasal congestion muscle aches , brain fog 14. O2 SATURATION MONITOR:  "Do you use an oxygen saturation monitor (pulse oximeter) at home?" If Yes, ask "What is your reading (oxygen level) today?" "What is your usual oxygen saturation reading?" (e.g., 95%)       na  Protocols used: Coronavirus (COVID-19) Diagnosed or Suspected-A-AH

## 2021-08-29 ENCOUNTER — Other Ambulatory Visit: Payer: Self-pay

## 2021-08-29 ENCOUNTER — Ambulatory Visit: Payer: Managed Care, Other (non HMO) | Admitting: Dermatology

## 2021-08-29 DIAGNOSIS — L409 Psoriasis, unspecified: Secondary | ICD-10-CM | POA: Diagnosis not present

## 2021-08-29 DIAGNOSIS — L820 Inflamed seborrheic keratosis: Secondary | ICD-10-CM | POA: Diagnosis not present

## 2021-08-29 MED ORDER — OTEZLA 30 MG PO TABS: 1.0000 | ORAL_TABLET | Freq: Two times a day (BID) | ORAL | 5 refills | Status: DC

## 2021-08-29 MED ORDER — VTAMA 1 % EX CREA: 1.0000 "application " | TOPICAL_CREAM | Freq: Every day | CUTANEOUS | 6 refills | Status: AC

## 2021-08-29 NOTE — Patient Instructions (Signed)
If side effects such as headache or depression become worst while taking otezla can decrease to once daily to see if that helps. If you become flared after gradually increase to twice daily. Sample pack given in office today to help you gradually increase   Continue moisturizers daily.    Vtama samples can be used topically daily to pink scaly areas at hands and other affected areas for psoriasis.     If You Need Anything After Your Visit  If you have any questions or concerns for your doctor, please call our main line at [phone] and press option 4 to reach your doctor's medical assistant. If no one answers, please leave a voicemail as directed and we will return your call as soon as possible. Messages left after 4 pm will be answered the following business day.   You may also send [place] a message via [place]. We typically respond to MyChart messages within 1-2 business days.  For prescription refills, please ask your pharmacy to contact our office. Our fax number is [phone].  If you have an urgent issue when the clinic is closed that cannot wait until the next business day, you can page your doctor at the number below.    Please note that while we do our best to be available for urgent issues outside of office hours, we are not available 24/7.   If you have an urgent issue and are unable to reach [place], you may choose to seek medical care at your doctor's office, retail clinic, urgent care center, or emergency room.  If you have a medical emergency, please immediately call 911 or go to the emergency department.  Pager Numbers  - Dr. Nehemiah Massed: [phone]  - Dr. Laurence Ferrari: [phone]  - Dr. Nicole Kindred: [phone]  In the event of inclement weather, please call our main line at [phone] for an update on the status of any delays or closures.  Dermatology Medication Tips: Please keep the boxes that topical medications come in in order to help keep track of the instructions about  where and how to use these. Pharmacies typically print the medication instructions only on the boxes and not directly on the medication tubes.   If your medication is too expensive, please contact our office at [phone] option 4 or send [place] a message through [place].   We are unable to tell what your co-pay for medications will be in advance as this is different depending on your insurance coverage. However, we may be able to find a substitute medication at lower cost or fill out paperwork to get insurance to cover a needed medication.   If a prior authorization is required to get your medication covered by your insurance company, please allow [place] 1-2 business days to complete this process.  Drug prices often vary depending on where the prescription is filled and some pharmacies may offer cheaper prices.  The website www.goodrx.com contains coupons for medications through different pharmacies. The prices here do not account for what the cost may be with help from insurance (it may be cheaper with your insurance), but the website can give you the price if you did not use any insurance.  - You can print the associated coupon and take it with your prescription to the pharmacy.  - You may also stop by our office during regular business hours and pick up a GoodRx coupon card.  - If you need your prescription sent electronically to a different pharmacy, notify our office through [facility]  or by phone at [phone] option 4.     Si Usted Necesita Algo Despus de Su Visita  Tambin puede enviarnos un mensaje a travs de Pharmacist, community. Por lo general respondemos a los mensajes de MyChart en el transcurso de 1 a 2 das hbiles.  Para renovar recetas, por favor pida a su farmacia que se ponga en contacto con nuestra oficina. Harland Dingwall de fax es [place] [phone].  Si tiene un asunto urgente cuando la clnica est cerrada y que no puede esperar hasta el siguiente da hbil, puede  llamar/localizar a su doctor(a) al nmero que aparece a continuacin.   Por favor, tenga en cuenta que aunque hacemos todo lo posible para estar disponibles para asuntos urgentes fuera del horario de [place], no estamos disponibles las 24 horas del da, los 7 das de la [place].   Si tiene un problema urgente y no puede comunicarse con nosotros, puede optar por buscar atencin mdica  en el consultorio de su doctor(a), en una clnica privada, en un centro de atencin urgente o en una sala de emergencias.  Si tiene Engineering geologist, por favor llame inmediatamente al 911 o vaya a la sala de emergencias.  Nmeros de bper  - Dr. Nehemiah Massed: [phone]  - Dra. Moye: [phone]  - Dra. Nicole Kindred: [phone]  En caso de inclemencias del [place], por favor llame a Johnsie Kindred principal al [phone] para una actualizacin sobre el [place] de cualquier retraso o cierre.  Consejos para la medicacin en dermatologa: Por favor, guarde las cajas en las que vienen los medicamentos de uso tpico para ayudarle a seguir las instrucciones sobre dnde y cmo usarlos. Las farmacias generalmente imprimen las instrucciones del medicamento slo en las cajas y no directamente en los tubos del [place].   Si su medicamento es muy caro, por favor, pngase en contacto con Zigmund Daniel llamando al [phone] y presione la opcin 4 o envenos un mensaje a travs de Pharmacist, community.   No podemos decirle cul ser su copago por los medicamentos por adelantado ya que esto es diferente dependiendo de la cobertura de su seguro. Sin embargo, es posible que podamos encontrar un medicamento sustituto a Electrical engineer un formulario para que el seguro cubra el medicamento que se considera necesario.   Si se requiere una autorizacin previa para que su compaa de seguros [place] su medicamento, por favor permtanos de 1 a 2 das hbiles para completar este proceso.  Los precios de los medicamentos varan con  frecuencia dependiendo del Environmental consultant de dnde se surte la receta y alguna farmacias pueden ofrecer precios ms baratos.  El sitio web www.goodrx.com tiene cupones para medicamentos de Airline pilot. Los precios aqu no tienen en cuenta lo que podra costar con la ayuda del seguro (puede ser ms barato con su seguro), pero el sitio web puede darle el precio si no utiliz Research scientist (physical sciences).  - Puede imprimir el cupn correspondiente y llevarlo con su receta a la farmacia.  - Tambin puede pasar por nuestra oficina durante el horario de atencin regular y Charity fundraiser una tarjeta de cupones de GoodRx.  - Si necesita que su receta se enve electrnicamente a una farmacia diferente, informe a nuestra oficina a travs de MyChart de [place] o por telfono llamando al [phone] y presione la opcin 4.

## 2021-08-29 NOTE — Progress Notes (Signed)
° °  Follow-Up Visit   Subjective  Ashlee Cruz is a 56 y.o. female who presents for the following: Follow-up (Patient here today for 6 month follow up on psoriasis. Patient reports a little more dryer and scaly at hands. Still feels is a lot better than use to be. Patient also reports a scaly spot at right clavicle area. Patient noticed a few weeks ago before christmas. Patient reports noticing after Covid. ).  Reports some headaches weekly but states taking tylenol.   The following portions of the chart were reviewed this encounter and updated as appropriate:  Tobacco   Allergies   Meds   Problems   Med Hx   Surg Hx   Fam Hx      Review of Systems: No other skin or systemic complaints except as noted in HPI or Assessment and Plan.  Objective  Well appearing patient in no apparent distress; mood and affect are within normal limits.  A focused examination was performed including hands and right infraclavicular. Relevant physical exam findings are noted in the Assessment and Plan.  hands Mild pinkness and peeling hands   right infraclavicular x 1 Erythematous stuck-on, waxy papule or plaque   Assessment & Plan  Psoriasis - flared hands Psoriasis is a chronic non-curable, but treatable genetic/hereditary disease that may have other systemic features affecting other organ systems such as joints (Psoriatic Arthritis). It is associated with an increased risk of inflammatory bowel disease, heart disease, non-alcoholic fatty liver disease, and depression.   Psoriasis -severe but better controlled on the hands with systemic oral Otezla.  She is much improved but not to goal.  We will   continue [facility]  Mild flare today. No depression or weight loss. No GI upset. Does have a headache once in awhile but the benefit of the medication outweighs the occasional headache.  Discussed if patient is bothered by side effects recommend lowering Otezla 30 mg to 1 by mouth daily. If flared  can restart but gradually increase dose. Patient given starter pack of Gerarda Fraction sample [id number] Lot [id number] Ex February 14 2022  Start vtama topically apply to affected areas of hands [id number] lot number 4 samples Nov 2023  Tapinarof (VTAMA) 1 % CREA - hands Apply 1 application topically daily. Apply to affected pink areas of hands and body for psoriasis  Related Medications Apremilast (OTEZLA) 30 MG TABS Take 1 tablet (30 mg total) by mouth 2 (two) times daily.  Inflamed seborrheic keratosis right infraclavicular x 1  Will recheck at next appointment, if still present will consider biopsy  Destruction of lesion - right infraclavicular x 1 Complexity: simple   Destruction method: cryotherapy   Informed consent: discussed and consent obtained   Timeout:  patient name, date of birth, surgical site, and procedure verified Lesion destroyed using liquid nitrogen: Yes   Region frozen until ice ball extended beyond lesion: Yes   Outcome: patient tolerated procedure well with no complications   Post-procedure details: wound care instructions given   Additional details:  Prior to procedure, discussed risks of blister formation, small wound, skin dyspigmentation, or rare scar following cryotherapy. Recommend Vaseline ointment to treated areas while healing.  Return for 6 month follow psoriasis and isk at right clavicle .  IRuthell Rummage, CMA, am acting as scribe for Sarina Ser, MD. Documentation: I have reviewed the above documentation for accuracy and completeness, and I agree with the above.  Sarina Ser, MD

## 2021-09-01 ENCOUNTER — Encounter: Payer: Self-pay | Admitting: Dermatology

## 2021-10-23 NOTE — Progress Notes (Signed)
Name: Ashlee Cruz   MRN: [id number]    DOB: [birth year]   Date:10/24/2021       Progress Note  Subjective  Chief Complaint  Annual Exam  HPI  Patient presents for annual CPE.  Diet: she eats a balanced diet  Exercise: discussed 150 minutes per week    [place] Office Visit from 10/24/2021 in [facility]  AUDIT-C Score 0      Depression: Phq 9 negative, she is upset about her mother that has some cognitive dysfunction  Depression screen [facility] 2/9 10/24/2021 06/20/2021 01/17/2021 12/06/2020 07/18/2020  Decreased Interest 0 0 0 0 0  Down, Depressed, Hopeless 1 0 0 0 0  PHQ - 2 Score 1 0 0 0 0  Altered sleeping 2 0 - 0 0  Tired, decreased energy 0 0 - 0 1  Change in appetite 0 0 - 0 1  Feeling bad or failure about yourself  0 0 - 0 0  Trouble concentrating 0 0 - 0 0  Moving slowly or fidgety/restless 0 0 - 0 1  Suicidal thoughts 0 0 - 0 0  PHQ-9 Score 3 0 - 0 3  Difficult doing work/chores - - - Not difficult at all -  Some recent data might be hidden   Hypertension: BP Readings from Last 3 Encounters:  10/24/21 118/82  06/20/21 112/68  01/17/21 110/68   Obesity: Wt Readings from Last 3 Encounters:  10/24/21 163 lb (73.9 kg)  06/20/21 166 lb (75.3 kg)  01/17/21 173 lb (78.5 kg)   BMI Readings from Last 3 Encounters:  10/24/21 30.80 kg/m  06/20/21 31.37 kg/m  01/17/21 32.69 kg/m     Vaccines:   HPV: N/A Tdap: up to date Shingrix: up to date Pneumonia: N/A Flu: up to date COVID-19:  discussed bivalent booster    Hep C Screening: 02/08/20 STD testing and prevention (HIV/chl/gon/syphilis): N/A Intimate partner violence: negative Sexual History : same partner, no pain or discomfort  Menstrual History/LMP/Abnormal Bleeding:  Discussed importance of follow up if any post-menopausal bleeding: yes Incontinence Symptoms: no   Breast cancer:  - Last Mammogram: Ordered 01/17/21 - BRCA gene screening: N/A  Osteoporosis Prevention : Discussed  high calcium and vitamin D supplementation, weight bearing exercises Bone density :not applicable  Cervical cancer screening: 01/31/19 she will repeat in 5 years   Skin cancer: Discussed monitoring for atypical lesions  Colorectal cancer: 05/07/20   Lung cancer:  Low Dose CT Chest recommended if Age 74-80 years, 20 pack-year currently smoking OR have quit w/in 15years. Patient does not qualify.   ECG: 09/16/17  Advanced Care Planning: A voluntary discussion about advance care planning including the explanation and discussion of advance directives.  Discussed health care proxy and Living will, and the patient was able to identify a health care proxy as husband .  Patient does not  have a living will and power of attorney of health care   Lipids: Lab Results  Component Value Date   CHOL 245 (H) 10/23/2021   CHOL 256 (H) 12/24/2020   CHOL 230 (H) 02/08/2020   Lab Results  Component Value Date   HDL 102 10/23/2021   HDL 93 12/24/2020   HDL 97 02/08/2020   Lab Results  Component Value Date   LDLCALC 127 (H) 10/23/2021   LDLCALC 146 (H) 12/24/2020   LDLCALC 118 (H) 02/08/2020   Lab Results  Component Value Date   TRIG 93 10/23/2021   TRIG 100 12/24/2020  TRIG 88 02/08/2020   Lab Results  Component Value Date   CHOLHDL 2.4 10/23/2021   CHOLHDL 2.8 12/24/2020   CHOLHDL 2.4 02/08/2020   No results found for: LDLDIRECT  Glucose: Glucose  Date Value Ref Range Status  10/23/2021 89 70 - 99 mg/dL Final  12/24/2020 82 65 - 99 mg/dL Final  02/08/2020 86 65 - 99 mg/dL Final  10/29/2011 121 (H) 65 - 99 mg/dL Final   Glucose-Capillary  Date Value Ref Range Status  05/07/2020 92 70 - 99 mg/dL Final    Comment:    Glucose reference range applies only to samples taken after fasting for at least 8 hours.  05/07/2020 109 (H) 70 - 99 mg/dL Final    Comment:    Glucose reference range applies only to samples taken after fasting for at least 8 hours.    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   Dyslipidemia 46/56/8127   Dysmetabolic syndrome 51/70/0174   Bariatric surgery status 04/26/2015   H/O: HTN (hypertension) 04/26/2015   Psoriasis 04/26/2015   Allergic rhinitis, seasonal 04/26/2015   Vitamin D deficiency 04/26/2015   Bilateral knee pain 04/26/2015    Past Surgical History:  Procedure Laterality Date   BARIATRIC SURGERY     CHOLECYSTECTOMY     COLONOSCOPY WITH PROPOFOL N/A 05/07/2020   Procedure: COLONOSCOPY WITH PROPOFOL;  Surgeon: Virgel Manifold, MD;  Location: [facility];  Service: Endoscopy;  Laterality: N/A;   DILATION AND CURETTAGE OF UTERUS     ESOPHAGOGASTRODUODENOSCOPY (EGD) WITH PROPOFOL N/A 05/07/2020   Procedure: ESOPHAGOGASTRODUODENOSCOPY (EGD) WITH BIOPSY;  Surgeon: Virgel Manifold, MD;  Location: [facility];  Service: Endoscopy;  Laterality: N/A;   LUMBAR SPINE SURGERY Left 02/2016   Pt reports lumbar surgery without placement of instrumentation to decompress nerve on left side.     Family History  Problem Relation Age of Onset   Diabetes Mother    Skin cancer Mother    Diabetes Father    COPD Father    Seizures Father    Heart disease Father 72       CABG   Diabetes Brother    Multiple sclerosis Brother    Stroke Paternal Grandmother     Social History   Socioeconomic History   Marital status: Married    Spouse name: Lennette Bihari   Number of children: 1   Years of education: Not on file   Highest education level: Associate degree: occupational, Hotel manager, or vocational program  Occupational  History   Occupation: Scientist, clinical (histocompatibility and immunogenetics)   Tobacco Use   Smoking status: Never   Smokeless tobacco: Never  Vaping Use   Vaping Use: Never used  Substance and Sexual Activity   Alcohol use: No    Alcohol/week: 0.0 standard drinks   Drug use: No   Sexual activity: Yes    Partners: Male    Birth control/protection: None  Other Topics Concern   Not on file  Social History Narrative   Not on file   Social Determinants of Health   Financial Resource Strain: Low Risk    Difficulty of Paying Living Expenses: Not hard at  all  Food Insecurity: No Food Insecurity   Worried About Charity fundraiser in the Last Year: Never true   Ran Out of Food in the Last Year: Never true  Transportation Needs: No Transportation Needs   Lack of Transportation (Medical): No   Lack of Transportation (Non-Medical): No  Physical Activity: Insufficiently Active   Days of Exercise per Week: 2 days   Minutes of Exercise per Session: 20 min  Stress: Stress Concern Present   Feeling of Stress : Rather much  Social Connections: Moderately Integrated   Frequency of Communication with Friends and Family: More than three times a week   Frequency of Social Gatherings with Friends and Family: Twice a week   Attends Religious Services: More than 4 times per year   Active Member of [employer] or Organizations: No   Attends Archivist Meetings: Never   Marital Status: Married  Human resources officer Violence: Not At Risk   Fear of Current or Ex-Partner: No   Emotionally Abused: No   Physically Abused: No   Sexually Abused: No     Current Outpatient Medications:    acetaminophen (TYLENOL) 500 MG tablet, Take 1,000 mg by mouth as needed for moderate pain., Disp: , Rfl:    Apremilast (OTEZLA) 30 MG TABS, Take 1 tablet (30 mg total) by mouth 2 (two) times daily., Disp: 60 tablet, Rfl: 5   cholecalciferol (VITAMIN D3) 25 MCG (1000 UT) tablet, Take 2,000 Units by mouth daily., Disp: ,  Rfl:    DULoxetine (CYMBALTA) 60 MG capsule, Take 1 capsule (60 mg total) by mouth daily., Disp: 90 capsule, Rfl: 1   metFORMIN (GLUCOPHAGE-XR) 500 MG 24 hr tablet, Take 1 tablet (500 mg total) by mouth daily with breakfast., Disp: 90 tablet, Rfl: 1   methocarbamol (ROBAXIN) 500 MG tablet, Take 1 tablet (500 mg total) by mouth 2 (two) times daily as needed for muscle spasms., Disp: 180 tablet, Rfl: 1   Tapinarof (VTAMA) 1 % CREA, Apply 1 application topically daily. Apply to affected pink areas of hands and body for psoriasis, Disp: 60 g, Rfl: 6  Allergies  Allergen Reactions   Other Other (See Comments)    Any fragrant smell sets her off   Latex     Pt reports prolonged exposure causes aggravation in psoriasis   Codeine Rash     ROS  Constitutional: Negative for fever or weight change.  Respiratory: Negative for cough and shortness of breath.   Cardiovascular: Negative for chest pain or palpitations.  Gastrointestinal: Negative for abdominal pain, no bowel changes.  Musculoskeletal: Negative for gait problem or joint swelling.  Skin: Positive for rash.  Neurological: Negative for dizziness or headache.  No other specific complaints in a complete review of systems (except as listed in HPI above).   Objective  Vitals:   10/24/21 0918  BP: 118/82  Pulse: 89  Resp: 16  SpO2: 99%  Weight: 163 lb (73.9 kg)  Height: '5\' 1"'  (1.549 m)    Body mass index is 30.8 kg/m.  Physical Exam  Constitutional: Patient appears well-developed and well-nourished. No distress.  HENT: Head: Normocephalic and atraumatic. Ears: B TMs ok, no erythema or effusion; Nose: Not done Mouth/Throat: not done  Eyes: Conjunctivae and EOM are normal. Pupils are equal, round, and reactive to light. No scleral icterus.  Neck: Normal range of motion. Neck supple. No JVD present. No thyromegaly present.  Cardiovascular: Normal rate, regular rhythm and normal heart sounds.  No murmur heard. No  BLE  edema. Pulmonary/Chest: Effort normal and breath sounds normal. No respiratory distress. Abdominal: Soft. Bowel sounds are normal, no distension. There is no tenderness. no masses Breast: no lumps or masses, no nipple discharge or rashes FEMALE GENITALIA:  Not done  RECTAL: not done  Musculoskeletal: Normal range of motion, no joint effusions. No gross deformities Neurological: he is alert and oriented to person, place, and time. No cranial nerve deficit. Coordination, balance, strength, speech and gait are normal.  Skin:eczematous patches on right hand  Psychiatric: Patient has a normal mood and affect. behavior is normal. Judgment and thought content normal.   Recent Results (from the past 2160 hour(s))  Comprehensive metabolic panel     Status: Abnormal   Collection Time: 10/23/21  8:55 AM  Result Value Ref Range   Glucose 89 70 - 99 mg/dL   BUN 12 6 - 24 mg/dL   Creatinine, Ser 0.74 0.57 - 1.00 mg/dL   eGFR 96 >59 mL/min/1.73   BUN/Creatinine Ratio 16 9 - 23   Sodium 141 134 - 144 mmol/L   Potassium 4.5 3.5 - 5.2 mmol/L   Chloride 101 96 - 106 mmol/L   CO2 25 20 - 29 mmol/L   Calcium 9.9 8.7 - 10.2 mg/dL   Total Protein 7.2 6.0 - 8.5 g/dL   Albumin 4.9 3.8 - 4.9 g/dL   Globulin, Total 2.3 1.5 - 4.5 g/dL   Albumin/Globulin Ratio 2.1 1.2 - 2.2   Bilirubin Total 1.3 (H) 0.0 - 1.2 mg/dL   Alkaline Phosphatase 106 44 - 121 IU/L   AST 17 0 - 40 IU/L   ALT 12 0 - 32 IU/L  Hemoglobin A1c     Status: None   Collection Time: 10/23/21  8:55 AM  Result Value Ref Range   Hgb A1c MFr Bld 5.5 4.8 - 5.6 %    Comment:          Prediabetes: 5.7 - 6.4          Diabetes: >6.4          Glycemic control for adults with diabetes: <7.0    Est. average glucose Bld gHb Est-mCnc 111 mg/dL  Lipid panel     Status: Abnormal   Collection Time: 10/23/21  8:55 AM  Result Value Ref Range   Cholesterol, Total 245 (H) 100 - 199 mg/dL   Triglycerides 93 0 - 149 mg/dL   HDL 102 >39 mg/dL   VLDL  Cholesterol Cal 16 5 - 40 mg/dL   LDL Chol Calc (NIH) 127 (H) 0 - 99 mg/dL   Chol/HDL Ratio 2.4 0.0 - 4.4 ratio    Comment:                                   T. Chol/HDL Ratio                                             Men  Women                               1/2 Avg.Risk  3.4    3.3  Avg.Risk  5.0    4.4                                2X Avg.Risk  9.6    7.1                                3X Avg.Risk 23.4   11.0      Fall Risk: Fall Risk  10/24/2021 06/20/2021 01/17/2021 12/06/2020 07/18/2020  Falls in the past year? 0 0 0 1 1  Comment - - - - -  Number falls in past yr: 0 0 0 0 0  Injury with Fall? 0 0 0 1 0  Comment - - - - -  Risk for fall due to : No Fall Risks No Fall Risks - - -  Follow up Falls prevention discussed Falls prevention discussed - - -     Functional Status Survey: Is the patient deaf or have difficulty hearing?: No Does the patient have difficulty seeing, even when wearing glasses/contacts?: No Does the patient have difficulty concentrating, remembering, or making decisions?: No Does the patient have difficulty walking or climbing stairs?: No Does the patient have difficulty dressing or bathing?: No Does the patient have difficulty doing errands alone such as visiting a doctor's office or shopping?: No   Assessment & Plan  1. Well adult exam   Discussed stress management.  2. Breast cancer screening by mammogram  We will schedule it for her    -USPSTF grade A and B recommendations reviewed with patient; age-appropriate recommendations, preventive care, screening tests, etc discussed and encouraged; healthy living encouraged; see AVS for patient education given to patient -Discussed importance of 150 minutes of physical activity weekly, eat two servings of fish weekly, eat one serving of tree nuts ( cashews, pistachios, pecans, almonds.[employer]land Kitchen) every other day, eat 6 servings of fruit/vegetables daily and drink plenty of water and  avoid sweet beverages.   -Reviewed Health Maintenance: Yes.

## 2021-10-23 NOTE — Patient Instructions (Signed)
Preventive Care 56 Years Old, Female ?Preventive care refers to lifestyle choices and visits with your health care provider that can promote health and wellness. Preventive care visits are also called wellness exams. ?What can I expect for my preventive care visit? ?Counseling ?Your health care provider may ask you questions about your: ?Medical history, including: ?Past medical problems. ?Family medical history. ?Pregnancy history. ?Current health, including: ?Menstrual cycle. ?Method of birth control. ?Emotional well-being. ?Home life and relationship well-being. ?Sexual activity and sexual health. ?Lifestyle, including: ?Alcohol, nicotine or tobacco, and drug use. ?Access to firearms. ?Diet, exercise, and sleep habits. ?Work and work Statistician. ?Sunscreen use. ?Safety issues such as seatbelt and bike helmet use. ?Physical exam ?Your health care provider will check your: ?Height and weight. These may be used to calculate your BMI (body mass index). BMI is a measurement that tells if you are at a healthy weight. ?Waist circumference. This measures the distance around your waistline. This measurement also tells if you are at a healthy weight and may help predict your risk of certain diseases, such as type 2 diabetes and high blood pressure. ?Heart rate and blood pressure. ?Body temperature. ?Skin for abnormal spots. ?What immunizations do I need? ?Vaccines are usually given at various ages, according to a schedule. Your health care provider will recommend vaccines for you based on your age, medical history, and lifestyle or other factors, such as travel or where you work. ?What tests do I need? ?Screening ?Your health care provider may recommend screening tests for certain conditions. This may include: ?Lipid and cholesterol levels. ?Diabetes screening. This is done by checking your blood sugar (glucose) after you have not eaten for a while (fasting). ?Pelvic exam and Pap test. ?Hepatitis B test. ?Hepatitis C  test. ?HIV (human immunodeficiency virus) test. ?STI (sexually transmitted infection) testing, if you are at risk. ?Lung cancer screening. ?Colorectal cancer screening. ?Mammogram. Talk with your health care provider about when you should start having regular mammograms. This may depend on whether you have a family history of breast cancer. ?BRCA-related cancer screening. This may be done if you have a family history of breast, ovarian, tubal, or peritoneal cancers. ?Bone density scan. This is done to screen for osteoporosis. ?Talk with your health care provider about your test results, treatment options, and if necessary, the need for more tests. ?Follow these instructions at home: ?Eating and drinking ? ?Eat a diet that includes fresh fruits and vegetables, whole grains, lean protein, and low-fat dairy products. ?Take vitamin and mineral supplements as recommended by your health care provider. ?Do not drink alcohol if: ?Your health care provider tells you not to drink. ?You are pregnant, may be pregnant, or are planning to become pregnant. ?If you drink alcohol: ?Limit how much you have to 0-1 drink a day. ?Know how much alcohol is in your drink. In the [place] one drink equals one 12 oz bottle of beer (355 mL), one 5 oz glass of wine (148 mL), or one 1? oz glass of hard liquor (44 mL). ?Lifestyle ?Brush your teeth every morning and night with fluoride toothpaste. Floss one time each day. ?Exercise for at least 30 minutes 5 or more days each week. ?Do not use any products that contain nicotine or tobacco. These products include cigarettes, chewing tobacco, and vaping devices, such as e-cigarettes. If you need help quitting, ask your health care provider. ?Do not use drugs. ?If you are sexually active, practice safe sex. Use a condom or other form of protection to prevent  STIs. ?If you do not wish to become pregnant, use a form of birth control. If you plan to become pregnant, see your health care provider for a  prepregnancy visit. ?Take aspirin only as told by your health care provider. Make sure that you understand how much to take and what form to take. Work with your health care provider to find out whether it is safe and beneficial for you to take aspirin daily. ?Find healthy ways to manage stress, such as: ?Meditation, yoga, or listening to music. ?Journaling. ?Talking to a trusted person. ?Spending time with friends and family. ?Minimize exposure to UV radiation to reduce your risk of skin cancer. ?Safety ?Always wear your seat belt while driving or riding in a vehicle. ?Do not drive: ?If you have been drinking alcohol. Do not ride with someone who has been drinking. ?When you are tired or distracted. ?While texting. ?If you have been using any mind-altering substances or drugs. ?Wear a helmet and other protective equipment during sports activities. ?If you have firearms in your house, make sure you follow all gun safety procedures. ?Seek help if you have been physically or sexually abused. ?What's next? ?Visit your health care provider once a year for an annual wellness visit. ?Ask your health care provider how often you should have your eyes and teeth checked. ?Stay up to date on all vaccines. ?This information is not intended to replace advice given to you by your health care provider. Make sure you discuss any questions you have with your health care provider. ?Document Revised: 01/30/2021 Document Reviewed: 01/30/2021 ?Elsevier Patient Education ? 2022 [employer] ? ?

## 2021-10-24 ENCOUNTER — Encounter: Payer: Self-pay | Admitting: Family Medicine

## 2021-10-24 ENCOUNTER — Ambulatory Visit (INDEPENDENT_AMBULATORY_CARE_PROVIDER_SITE_OTHER): Payer: Managed Care, Other (non HMO) | Admitting: Family Medicine

## 2021-10-24 VITALS — BP 118/82 | HR 89 | Resp 16 | Ht 61.0 in | Wt 163.0 lb

## 2021-10-24 DIAGNOSIS — Z0000 Encounter for general adult medical examination without abnormal findings: Secondary | ICD-10-CM | POA: Diagnosis not present

## 2021-10-24 DIAGNOSIS — Z1231 Encounter for screening mammogram for malignant neoplasm of breast: Secondary | ICD-10-CM | POA: Diagnosis not present

## 2021-10-24 LAB — COMPREHENSIVE METABOLIC PANEL
ALT: 12 IU/L (ref 0–32)
AST: 17 IU/L (ref 0–40)
Albumin/Globulin Ratio: 2.1 (ref 1.2–2.2)
Albumin: 4.9 g/dL (ref 3.8–4.9)
Alkaline Phosphatase: 106 IU/L (ref 44–121)
BUN/Creatinine Ratio: 16 (ref 9–23)
BUN: 12 mg/dL (ref 6–24)
Bilirubin Total: 1.3 mg/dL — ABNORMAL HIGH (ref 0.0–1.2)
CO2: 25 mmol/L (ref 20–29)
Calcium: 9.9 mg/dL (ref 8.7–10.2)
Chloride: 101 mmol/L (ref 96–106)
Creatinine, Ser: 0.74 mg/dL (ref 0.57–1.00)
Globulin, Total: 2.3 g/dL (ref 1.5–4.5)
Glucose: 89 mg/dL (ref 70–99)
Potassium: 4.5 mmol/L (ref 3.5–5.2)
Sodium: 141 mmol/L (ref 134–144)
Total Protein: 7.2 g/dL (ref 6.0–8.5)
eGFR: 96 mL/min/{1.73_m2} (ref >59)

## 2021-10-24 LAB — LIPID PANEL
Chol/HDL Ratio: 2.4 ratio (ref 0.0–4.4)
Cholesterol, Total: 245 mg/dL — ABNORMAL HIGH (ref 100–199)
HDL: 102 mg/dL (ref >39)
LDL Chol Calc (NIH): 127 mg/dL — ABNORMAL HIGH (ref 0–99)
Triglycerides: 93 mg/dL (ref 0–149)
VLDL Cholesterol Cal: 16 mg/dL (ref 5–40)

## 2021-10-24 LAB — HEMOGLOBIN A1C
Est. average glucose Bld gHb Est-mCnc: 111 mg/dL
Hgb A1c MFr Bld: 5.5 % (ref 4.8–5.6)

## 2021-10-29 ENCOUNTER — Ambulatory Visit
Admission: RE | Admit: 2021-10-29 | Discharge: 2021-10-29 | Disposition: A | Discharge status: Home / Self Care | Payer: Managed Care, Other (non HMO) | Source: Ambulatory Visit | Attending: Family Medicine | Admitting: Family Medicine

## 2021-10-29 ENCOUNTER — Other Ambulatory Visit: Payer: Self-pay

## 2021-10-29 DIAGNOSIS — Z1231 Encounter for screening mammogram for malignant neoplasm of breast: Secondary | ICD-10-CM | POA: Diagnosis present

## 2021-12-19 ENCOUNTER — Ambulatory Visit: Payer: Managed Care, Other (non HMO) | Admitting: Family Medicine

## 2021-12-26 ENCOUNTER — Encounter: Payer: Self-pay | Admitting: *Deleted

## 2021-12-26 ENCOUNTER — Encounter: Payer: Self-pay | Admitting: Oncology

## 2021-12-26 ENCOUNTER — Telehealth: Payer: Self-pay

## 2021-12-26 NOTE — Progress Notes (Signed)
Pt returned call and stated she will like to start B12 injections; has an appointment on Monday and said she will decide then if to self-administer or have the facility administer them.  ?

## 2021-12-26 NOTE — Progress Notes (Signed)
Reached out to pt in regards to b12 inj; if she is currently administering them or if we need to start them. Pt did not answer; left VM to return call or send a MyChart. ?

## 2021-12-30 ENCOUNTER — Other Ambulatory Visit: Payer: Self-pay

## 2021-12-30 ENCOUNTER — Other Ambulatory Visit: Payer: Self-pay | Admitting: *Deleted

## 2021-12-30 ENCOUNTER — Encounter: Payer: Self-pay | Admitting: Oncology

## 2021-12-30 ENCOUNTER — Inpatient Hospital Stay: Payer: Managed Care, Other (non HMO)

## 2021-12-30 ENCOUNTER — Inpatient Hospital Stay: Payer: Managed Care, Other (non HMO) | Attending: Oncology | Admitting: Oncology

## 2021-12-30 VITALS — BP 110/77 | HR 95 | Temp 96.3°F | Resp 20 | Wt 163.9 lb

## 2021-12-30 DIAGNOSIS — E538 Deficiency of other specified B group vitamins: Secondary | ICD-10-CM

## 2021-12-30 DIAGNOSIS — D509 Iron deficiency anemia, unspecified: Secondary | ICD-10-CM | POA: Diagnosis not present

## 2021-12-30 DIAGNOSIS — Z79899 Other long term (current) drug therapy: Secondary | ICD-10-CM | POA: Diagnosis not present

## 2021-12-30 DIAGNOSIS — Z9884 Bariatric surgery status: Secondary | ICD-10-CM | POA: Diagnosis not present

## 2021-12-30 MED ORDER — CYANOCOBALAMIN 1000 MCG/ML IJ SOLN: INTRAMUSCULAR | 10 refills | Status: DC

## 2021-12-30 MED ORDER — CYANOCOBALAMIN 1000 MCG/ML IJ SOLN
1000.0000 ug | Freq: Once | INTRAMUSCULAR | Status: AC
  Administered 2021-12-30: 1000 ug via INTRAMUSCULAR
  Filled 2021-12-30: qty 1

## 2021-12-30 MED ORDER — CYANOCOBALAMIN 1000 MCG/ML IJ SOLN: 1000.0000 ug | Freq: Once | INTRAMUSCULAR | Status: DC

## 2021-12-30 MED ORDER — CYANOCOBALAMIN 1000 MCG/ML IJ SOLN: 1000.0000 ug | INTRAMUSCULAR | 10 refills | Status: DC

## 2021-12-30 MED ORDER — "SYRINGE 25G X 5/8"" 3 ML MISC": 3.0000 mL | 0 refills | Status: DC

## 2021-12-30 NOTE — Progress Notes (Signed)
B12 shot today. 

## 2022-01-02 ENCOUNTER — Encounter: Payer: Self-pay | Admitting: Oncology

## 2022-01-02 NOTE — Progress Notes (Signed)
Hematology/Oncology Consult note [facility]  [phone] [phone]  Patient Care Team: Steele Sizer, MD as PCP - General (Family Medicine)   Name of the patient: Ashlee Cruz  [id number]  Feb 13, 1966   Date of visit: 01/02/22  Diagnosis-iron deficiency anemia possibly secondary to gastric bypass  Chief complaint/ Reason for visit-routine follow-up of iron deficiency anemia  Heme/Onc history: Patient is a 56 year old female with a past medical history significant for diabetes, depression who has been referred to [place] for anemia.  Patient was recently seen by Dr. Bonna Gains for iron deficiency.  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  Recent labs were from June 2021 which showed H&H of 11.6/35.9 with an MCV of 81.  B12 and folate were normal.  Iron studies showed a low ferritin of 5 and low iron saturation of 6% with elevated TIBC of 494.  Patient reports ongoing fatigue.  Denies any blood loss in her stool or urine.  Denies any dark melanotic stools.  Denies any consistent use of NSAIDs.  No significant family history of colon cancer.  Interval history-patient is doing well overall.  Other than mild fatigue she denies other complaints at this time  ECOG PS- 1 Pain scale- 0   Review of systems- Review of Systems  Constitutional:  Positive for malaise/fatigue. Negative for chills, fever and weight loss.  HENT:  Negative for congestion, ear discharge and nosebleeds.   Eyes:  Negative for blurred vision.  Respiratory:  Negative for cough, hemoptysis, sputum production, shortness of breath and wheezing.   Cardiovascular:  Negative for chest pain, palpitations, orthopnea and claudication.  Gastrointestinal:  Negative for abdominal pain, blood in stool, constipation, diarrhea, heartburn, melena, nausea and vomiting.  Genitourinary:  Negative for dysuria, flank pain, frequency, hematuria and urgency.   Musculoskeletal:  Negative for back pain, joint pain and myalgias.  Skin:  Negative for rash.  Neurological:  Negative for dizziness, tingling, focal weakness, seizures, weakness and headaches.  Endo/Heme/Allergies:  Does not bruise/bleed easily.  Psychiatric/Behavioral:  Negative for depression and suicidal ideas. The patient does not have insomnia.      Allergies  Allergen Reactions   Other Other (See Comments)    Any fragrant smell sets her off   Latex     Pt reports prolonged exposure causes aggravation in psoriasis   Codeine Rash     Past Medical History:  Diagnosis Date   Allergic rhinitis    Anemia    Depression    Family history of adverse reaction to anesthesia    Mother - PONV   Hyperlipidemia    Hypertension    Metabolic syndrome    Obesity    PONV (postoperative nausea and vomiting)    after gallbladder surgery   Psoriasis    Sciatica of left side    Vertigo    1 episode over 1 yr ago     Past Surgical History:  Procedure Laterality Date   BARIATRIC SURGERY     CHOLECYSTECTOMY     COLONOSCOPY WITH PROPOFOL N/A 05/07/2020   Procedure: COLONOSCOPY WITH PROPOFOL;  Surgeon: Virgel Manifold, MD;  Location: [place];  Service: Endoscopy;  Laterality: N/A;   DILATION AND CURETTAGE OF UTERUS     ESOPHAGOGASTRODUODENOSCOPY (EGD) WITH PROPOFOL N/A 05/07/2020   Procedure: ESOPHAGOGASTRODUODENOSCOPY (EGD) WITH BIOPSY;  Surgeon: Virgel Manifold, MD;  Location: [place];  Service: Endoscopy;  Laterality: N/A;   LUMBAR SPINE SURGERY  Left 02/2016   Pt reports lumbar surgery without placement of instrumentation to decompress nerve on left side.     Social History   Socioeconomic History   Marital status: Married    Spouse name: Lennette Bihari   Number of children: 1   Years of education: Not on file   Highest education level: Associate degree: occupational, Hotel manager, or vocational program  Occupational History   Occupation:  education/training   Tobacco Use   Smoking status: Never   Smokeless tobacco: Never  Vaping Use   Vaping Use: Never used  Substance and Sexual Activity   Alcohol use: No    Alcohol/week: 0.0 standard drinks   Drug use: No   Sexual activity: Yes    Partners: Male    Birth control/protection: None  Other Topics Concern   Not on file  Social History Narrative   Not on file   Social Determinants of Health   Financial Resource Strain: Low Risk    Difficulty of Paying Living Expenses: Not hard at all  Food Insecurity: No Food Insecurity   Worried About Charity fundraiser in the Last Year: Never true   [place] in the Last Year: Never true  Transportation Needs: No Transportation Needs   Lack of Transportation (Medical): No   Lack of Transportation (Non-Medical): No  Physical Activity: Insufficiently Active   Days of Exercise per Week: 2 days   Minutes of Exercise per Session: 20 min  Stress: Stress Concern Present   Feeling of Stress : Rather much  Social Connections: Moderately Integrated   Frequency of Communication with Friends and Family: More than three times a week   Frequency of Social Gatherings with Friends and Family: Twice a week   Attends Religious Services: More than 4 times per year   Active Member of [employer] or Organizations: No   Attends Music therapist: Never   Marital Status: Married  Human resources officer Violence: Not At Risk   Fear of Current or Ex-Partner: No   Emotionally Abused: No   Physically Abused: No   Sexually Abused: No    Family History  Problem Relation Age of Onset   Diabetes Mother    Skin cancer Mother    Diabetes Father    COPD Father    Seizures Father    Heart disease Father 77       CABG   Diabetes Brother    Multiple sclerosis Brother    Stroke Paternal Grandmother      Current Outpatient Medications:    acetaminophen (TYLENOL) 500 MG tablet, Take 1,000 mg by mouth as needed for moderate pain., Disp: ,  Rfl:    Apremilast (OTEZLA) 30 MG TABS, Take 1 tablet (30 mg total) by mouth 2 (two) times daily., Disp: 60 tablet, Rfl: 5   cholecalciferol (VITAMIN D3) 25 MCG (1000 UT) tablet, Take 2,000 Units by mouth daily., Disp: , Rfl:    cyanocobalamin (,VITAMIN B-12,) 1000 MCG/ML injection, Inject 1 mL (1,000 mcg total) into the muscle once a week for 30 days, THEN 1 mL (1,000 mcg total) every 30 (thirty) days., Disp: 1 mL, Rfl: 10   diphenhydrAMINE HCl (BENADRYL ALLERGY PO), Take by mouth. PRN, Disp: , Rfl:    DULoxetine (CYMBALTA) 60 MG capsule, Take 1 capsule (60 mg total) by mouth daily., Disp: 90 capsule, Rfl: 1   metFORMIN (GLUCOPHAGE-XR) 500 MG 24 hr tablet, Take 1 tablet (500 mg total) by mouth daily with breakfast., Disp: 90 tablet, Rfl:  1   methocarbamol (ROBAXIN) 500 MG tablet, Take 1 tablet (500 mg total) by mouth 2 (two) times daily as needed for muscle spasms. (Patient not taking: Reported on 12/30/2021), Disp: 180 tablet, Rfl: 1   Syringe/Needle, Disp, (SYRINGE 3CC/25GX5/8") 25G X 5/8" 3 ML MISC, 3 mLs by Does not apply route every 30 (thirty) days., Disp: 11 each, Rfl: 0   Tapinarof (VTAMA) 1 % CREA, Apply 1 application topically daily. Apply to affected pink areas of hands and body for psoriasis (Patient not taking: Reported on 12/30/2021), Disp: 60 g, Rfl: 6  Physical exam:  Vitals:   12/30/21 1431  BP: 110/77  Pulse: 95  Resp: 20  Temp: (!) 96.3 F (35.7 C)  SpO2: 97%  Weight: 163 lb 14.4 oz (74.3 kg)   Physical Exam Constitutional:      General: She is not in acute distress. Cardiovascular:     Rate and Rhythm: Normal rate and regular rhythm.     Heart sounds: Normal heart sounds.  Pulmonary:     Effort: Pulmonary effort is normal.     Breath sounds: Normal breath sounds.  Abdominal:     General: Bowel sounds are normal.     Palpations: Abdomen is soft.  Skin:    General: Skin is warm and dry.  Neurological:     Mental Status: She is alert and oriented to person, place,  and time.        Latest Ref Rng & Units 10/23/2021    8:55 AM  CMP  Glucose 70 - 99 mg/dL 89    BUN 6 - 24 mg/dL 12    Creatinine 0.57 - 1.00 mg/dL 0.74    Sodium 134 - 144 mmol/L 141    Potassium 3.5 - 5.2 mmol/L 4.5    Chloride 96 - 106 mmol/L 101    CO2 20 - 29 mmol/L 25    Calcium 8.7 - 10.2 mg/dL 9.9    Total Protein 6.0 - 8.5 g/dL 7.2    Total Bilirubin 0.0 - 1.2 mg/dL 1.3    Alkaline Phos 44 - 121 IU/L 106    AST 0 - 40 IU/L 17    ALT 0 - 32 IU/L 12        Latest Ref Rng & Units 12/24/2020    8:15 AM  CBC  WBC 3.4 - 10.8 x10E3/uL 5.1    Hemoglobin 11.1 - 15.9 g/dL 14.6    Hematocrit 34.0 - 46.6 % 42.8    Platelets 150 - 450 x10E3/uL 290      No images are attached to the encounter.  No results found.   Assessment and plan- Patient is a 56 y.o. female here for routine follow-up of iron deficiency anemia secondary to gastric bypass  I have reviewed patient's recent labs from [facility] which shows a low B12 level of 166.  Iron studies are presently within normal limits and she does not require IV iron at this time.  She will get her first dose of B12 injection today and then I will send her home B12 injections which she will take weekly x4 followed by monthly.  We will repeat CBC ferritin and iron studies in 6 months in 1 year along with B12 levels and I will see her back in 1 year   Visit Diagnosis 1. Iron deficiency anemia, unspecified iron deficiency anemia type      Dr. Randa Evens, MD, MPH [facility] [phone] 01/02/2022 11:06 AM

## 2022-01-07 ENCOUNTER — Other Ambulatory Visit: Payer: Self-pay

## 2022-01-07 ENCOUNTER — Telehealth: Payer: Self-pay | Admitting: *Deleted

## 2022-01-07 MED ORDER — "SYRINGE 25G X 5/8"" 3 ML MISC": 3.0000 mL | 0 refills | Status: DC

## 2022-01-07 MED ORDER — CYANOCOBALAMIN 1000 MCG/ML IJ SOLN: INTRAMUSCULAR | 11 refills | Status: AC

## 2022-01-07 NOTE — Telephone Encounter (Signed)
Patient's message stated that when she was here to see Dr. Janese Banks, she was instructed to take the B12 injections weekly x 4, then monthly, but when she went to pick up her B12 RX at [facility] in [place], they only gave her one injection and the instructions said monthly. She is concerned because she doesn't have enough to do the weekly injections x 3, as she was instructed by Dr. Janese Banks. Can we send in a new RX for the 3 weekly injections? Please call her [phone].

## 2022-01-07 NOTE — Telephone Encounter (Signed)
Called pharmacy and every thing is good now and pt knows also

## 2022-01-07 NOTE — Telephone Encounter (Signed)
Can you send a new prescription? B12 weekly X 4 followed by monthly

## 2022-01-07 NOTE — Telephone Encounter (Signed)
Called and spoke to the pharmacy and spoke to the pharmacist ang we put in another order for the b12 inj. Dr Janese Banks wanted her to have b12 injections weekly x 4 and then do monthly. The pharmacist said if pt waits to may 26 she will have the 3 injections for weekly because she already took her first one and then after 3 more inj. It will go to monthly and staff will take out the first  rx and use the second one that says the above as spelled out. Called pt and told her the same thing and she is ok with this

## 2022-02-19 NOTE — Progress Notes (Unsigned)
Name: Ashlee Cruz   MRN: [id number]    DOB: 01-Feb-1966   Date:02/20/2022       Progress Note  Subjective  Chief Complaint  Follow Up  HPI  Pre-diabetes: last A1C was 5.7% and today is 5.5 % . She has been more active lately, she denies polyphagia, polydipsia or polyphagia. She is on Metformin and denies side effects of medication .    History of bariatric surgery: she has a history of iron deficiency anemia, she is taking MVI, her weight prior to surgery was 236 lbs, her weight went down to mid 130's, it was stable in the 170 lbs range and over the past couple of visits is down tin the 160 lbs range. She tries to have a balanced diet.    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 Consuella Lose  in July 2017 and was doing great, but a few months after the surgery she had a flare. She was seen by Dr. Kathyrn Sheriff and had steroid injection end of November 2017 and symptoms improved a little, she had another injection January 2018.Lately feeling more popping on her neck and more stiff, advised to try taking Robaxin more often. She usually only takes it at night   Dyslipidemia: LDL has improved, down from 146 to 127   Toothache : she has noticed pain on left upper tooth that radiates to left ear, sometimes has left side of nose . She has an old crown on the area of the pain, advised to follow up with dentist first, also discussed trigeminal neuralgia    Major Depression Remission: she is now on Cymbalta to help with pain and mood.  She still worries about her mother that lives in [place]. Worried about her son being bullied at the end of the school year    Palpitation: she has intermittent symptoms, occasionally gets dizzy when she gets up quickly, but no chest pain Unchanged    B12 and vitamin D deficiency: very low B12 at 78 - checked by hematologist and is now getting monthly injections, continue vitamin D otc    Eczema: both hands , well controlled with  medication, sees Dr. Nehemiah Massed . Tried to go down on the dose but it flared up so back on the previous dose and doing well    Patient Active Problem List   Diagnosis Date Noted   Iron deficiency anemia 05/02/2020   History of iron deficiency 01/11/2019   Tachycardia 05/19/2017   Palpitations 05/19/2017   Fibroid uterus 12/20/2015   History of iron deficiency anemia 10/23/2015   Left lumbar radiculitis 10/23/2015   Absence of menstruation 04/26/2015   B12 deficiency 04/26/2015   Depression, major, recurrent, mild ([place]) 04/26/2015   Dyslipidemia 52/77/8242   Dysmetabolic syndrome 35/36/1443   Bariatric surgery status 04/26/2015   H/O: HTN (hypertension) 04/26/2015   Psoriasis 04/26/2015   Allergic rhinitis, seasonal 04/26/2015   Vitamin D deficiency 04/26/2015   Bilateral knee pain 04/26/2015    Past Surgical History:  Procedure Laterality Date   BARIATRIC SURGERY     CHOLECYSTECTOMY     COLONOSCOPY WITH PROPOFOL N/A 05/07/2020   Procedure: COLONOSCOPY WITH PROPOFOL;  Surgeon: Virgel Manifold, MD;  Location: [place];  Service: Endoscopy;  Laterality: N/A;   DILATION AND CURETTAGE OF UTERUS     ESOPHAGOGASTRODUODENOSCOPY (EGD) WITH PROPOFOL N/A 05/07/2020   Procedure: ESOPHAGOGASTRODUODENOSCOPY (EGD) WITH BIOPSY;  Surgeon: Virgel Manifold, MD;  [facility];  Service: Endoscopy;  Laterality: N/A;   LUMBAR SPINE SURGERY Left 02/2016   Pt reports lumbar surgery without placement of instrumentation to decompress nerve on left side.     Family History  Problem Relation Age of Onset   Diabetes Mother    Skin cancer Mother    Diabetes Father    COPD Father    Seizures Father    Heart disease Father 24       CABG   Diabetes Brother    Multiple sclerosis Brother    Stroke Paternal Grandmother     Social History   Tobacco Use   Smoking status: Never   Smokeless tobacco: Never  Substance Use Topics   Alcohol use: No    Alcohol/week:  0.0 standard drinks of alcohol     Current Outpatient Medications:    acetaminophen (TYLENOL) 500 MG tablet, Take 1,000 mg by mouth as needed for moderate pain., Disp: , Rfl:    Apremilast (OTEZLA) 30 MG TABS, Take 1 tablet (30 mg total) by mouth 2 (two) times daily., Disp: 60 tablet, Rfl: 5   cholecalciferol (VITAMIN D3) 25 MCG (1000 UT) tablet, Take 2,000 Units by mouth daily., Disp: , Rfl:    cyanocobalamin (,VITAMIN B-12,) 1000 MCG/ML injection, Inject 1 mL (1,000 mcg total) into the muscle once a week for 30 days, THEN 1 mL (1,000 mcg total) every 30 (thirty) days., Disp: 1 mL, Rfl: 10   cyanocobalamin (,VITAMIN B-12,) 1000 MCG/ML injection, Inject 1 mL (1,000 mcg total) into the muscle once a week for 30 days, THEN 1 mL (1,000 mcg total) every 30 (thirty) days., Disp: 1 mL, Rfl: 11   diphenhydrAMINE HCl (BENADRYL ALLERGY PO), Take by mouth. PRN, Disp: , Rfl:    DULoxetine (CYMBALTA) 60 MG capsule, Take 1 capsule (60 mg total) by mouth daily., Disp: 90 capsule, Rfl: 1   metFORMIN (GLUCOPHAGE-XR) 500 MG 24 hr tablet, Take 1 tablet (500 mg total) by mouth daily with breakfast., Disp: 90 tablet, Rfl: 1   methocarbamol (ROBAXIN) 500 MG tablet, Take 1 tablet (500 mg total) by mouth 2 (two) times daily as needed for muscle spasms., Disp: 180 tablet, Rfl: 1   Syringe/Needle, Disp, (SYRINGE 3CC/25GX5/8") 25G X 5/8" 3 ML MISC, 3 mLs by Does not apply route every 30 (thirty) days., Disp: 11 each, Rfl: 0   Tapinarof (VTAMA) 1 % CREA, Apply 1 application topically daily. Apply to affected pink areas of hands and body for psoriasis, Disp: 60 g, Rfl: 6  Allergies  Allergen Reactions   Other Other (See Comments)    Any fragrant causes psoriasus   Latex     Pt reports prolonged exposure causes aggravation in psoriasis   Codeine Rash    I personally reviewed active problem list, medication list, allergies, family history, social history, health maintenance with the patient/caregiver  today.   ROS  Constitutional: Negative for fever , positive for  weight change.  Respiratory: Negative for cough and shortness of breath.   Cardiovascular: Negative for chest pain or palpitations.  Gastrointestinal: Negative for abdominal pain, no bowel changes.  Musculoskeletal: Negative for gait problem or joint swelling.  Skin: Negative for rash.  Neurological: Negative for dizziness or headache.  No other specific complaints in a complete review of systems (except as listed in HPI above).   Objective  Vitals:   02/20/22 0941  BP: 108/68  Pulse: 92  Resp: 16  SpO2: 95%  Weight: 162 lb (73.5 kg)  Height:  5' (1.524 m)    Body mass index is 31.64 kg/m.  Physical Exam  Constitutional: Patient appears well-developed and well-nourished. Obese  No distress.  HEENT: head atraumatic, normocephalic, pupils equal and reactive to light,, neck supple, throat within normal limits Cardiovascular: Normal rate, regular rhythm and normal heart sounds.  No murmur heard. No BLE edema. Pulmonary/Chest: Effort normal and breath sounds normal. No respiratory distress. Abdominal: Soft.  There is no tenderness. Psychiatric: Patient has a normal mood and affect. behavior is normal. Judgment and thought content normal.  PHQ2/9:    02/20/2022    9:41 AM 10/24/2021    9:18 AM 06/20/2021    8:10 AM 01/17/2021    8:04 AM 12/06/2020    1:22 PM  Depression screen PHQ 2/9  Decreased Interest 0 0 0 0 0  Down, Depressed, Hopeless 0 1 0 0 0  PHQ - 2 Score 0 1 0 0 0  Altered sleeping 0 2 0  0  Tired, decreased energy 0 0 0  0  Change in appetite 0 0 0  0  Feeling bad or failure about yourself  0 0 0  0  Trouble concentrating 0 0 0  0  Moving slowly or fidgety/restless 0 0 0  0  Suicidal thoughts 0 0 0  0  PHQ-9 Score 0 3 0  0  Difficult doing work/chores     Not difficult at all    phq 9 is negative   Fall Risk:    02/20/2022    9:41 AM 10/24/2021    9:17 AM 06/20/2021    8:10 AM 01/17/2021     8:04 AM 12/06/2020    1:22 PM  Fall Risk   Falls in the past year? 0 0 0 0 1  Number falls in past yr: 0 0 0 0 0  Injury with Fall? 0 0 0 0 1  Risk for fall due to : No Fall Risks No Fall Risks No Fall Risks    Follow up Falls prevention discussed Falls prevention discussed Falls prevention discussed        Functional Status Survey: Is the patient deaf or have difficulty hearing?: No Does the patient have difficulty seeing, even when wearing glasses/contacts?: No Does the patient have difficulty concentrating, remembering, or making decisions?: No Does the patient have difficulty walking or climbing stairs?: No Does the patient have difficulty dressing or bathing?: No Does the patient have difficulty doing errands alone such as visiting a doctor's office or shopping?: No    Assessment & Plan  1. Major depression in remission [facility])  Doing well on duloxetine   2. Dyslipidemia   3. Dysmetabolic syndrome  - metFORMIN (GLUCOPHAGE-XR) 500 MG 24 hr tablet; Take 1 tablet (500 mg total) by mouth daily with breakfast.  Dispense: 90 tablet; Refill: 1  4. Chronic midline low back pain with left-sided sciatica  - DULoxetine (CYMBALTA) 60 MG capsule; Take 1 capsule (60 mg total) by mouth daily.  Dispense: 90 capsule; Refill: 1  5. Bariatric surgery status  - metFORMIN (GLUCOPHAGE-XR) 500 MG 24 hr tablet; Take 1 tablet (500 mg total) by mouth daily with breakfast.  Dispense: 90 tablet; Refill: 1  6. B12 deficiency  Continues supplementation  7. Vitamin D deficiency  Continue supplementation   8. Depression, major, recurrent, mild (HCC)  - DULoxetine (CYMBALTA) 60 MG capsule; Take 1 capsule (60 mg total) by mouth daily.  Dispense: 90 capsule; Refill: 1 .

## 2022-02-20 ENCOUNTER — Encounter: Payer: Self-pay | Admitting: Family Medicine

## 2022-02-20 ENCOUNTER — Ambulatory Visit: Payer: Managed Care, Other (non HMO) | Admitting: Family Medicine

## 2022-02-20 VITALS — BP 108/68 | HR 92 | Resp 16 | Ht 60.0 in | Wt 162.0 lb

## 2022-02-20 DIAGNOSIS — F325 Major depressive disorder, single episode, in full remission: Secondary | ICD-10-CM | POA: Diagnosis not present

## 2022-02-20 DIAGNOSIS — M5442 Lumbago with sciatica, left side: Secondary | ICD-10-CM | POA: Diagnosis not present

## 2022-02-20 DIAGNOSIS — E8881 Metabolic syndrome: Secondary | ICD-10-CM | POA: Diagnosis not present

## 2022-02-20 DIAGNOSIS — G8929 Other chronic pain: Secondary | ICD-10-CM

## 2022-02-20 DIAGNOSIS — E559 Vitamin D deficiency, unspecified: Secondary | ICD-10-CM

## 2022-02-20 DIAGNOSIS — E538 Deficiency of other specified B group vitamins: Secondary | ICD-10-CM

## 2022-02-20 DIAGNOSIS — F330 Major depressive disorder, recurrent, mild: Secondary | ICD-10-CM

## 2022-02-20 DIAGNOSIS — Z9884 Bariatric surgery status: Secondary | ICD-10-CM

## 2022-02-20 DIAGNOSIS — E785 Hyperlipidemia, unspecified: Secondary | ICD-10-CM | POA: Diagnosis not present

## 2022-02-20 MED ORDER — DULOXETINE HCL 60 MG PO CPEP: 60.0000 mg | ORAL_CAPSULE | Freq: Every day | ORAL | 1 refills | Status: DC

## 2022-02-20 MED ORDER — METFORMIN HCL ER 500 MG PO TB24: 500.0000 mg | ORAL_TABLET | Freq: Every day | ORAL | 1 refills | Status: DC

## 2022-03-11 ENCOUNTER — Ambulatory Visit: Payer: Managed Care, Other (non HMO) | Admitting: Dermatology

## 2022-03-11 DIAGNOSIS — L821 Other seborrheic keratosis: Secondary | ICD-10-CM | POA: Diagnosis not present

## 2022-03-11 DIAGNOSIS — D2339 Other benign neoplasm of skin of other parts of face: Secondary | ICD-10-CM | POA: Diagnosis not present

## 2022-03-11 DIAGNOSIS — L820 Inflamed seborrheic keratosis: Secondary | ICD-10-CM | POA: Diagnosis not present

## 2022-03-11 DIAGNOSIS — D2330 Other benign neoplasm of skin of unspecified part of face: Secondary | ICD-10-CM

## 2022-03-11 DIAGNOSIS — L578 Other skin changes due to chronic exposure to nonionizing radiation: Secondary | ICD-10-CM

## 2022-03-11 DIAGNOSIS — L409 Psoriasis, unspecified: Secondary | ICD-10-CM

## 2022-03-11 DIAGNOSIS — Z79899 Other long term (current) drug therapy: Secondary | ICD-10-CM | POA: Diagnosis not present

## 2022-03-11 NOTE — Patient Instructions (Signed)
Due to recent changes in healthcare laws, you may see results of your pathology and/or laboratory studies on MyChart before the doctors have had a chance to review them. We understand that in some cases there may be results that are confusing or concerning to you. Please understand that not all results are received at the same time and often the doctors may need to interpret multiple results in order to provide you with the best plan of care or course of treatment. Therefore, we ask that you please give us 2 business days to thoroughly review all your results before contacting the office for clarification. Should we see a critical lab result, you will be contacted sooner.   If You Need Anything After Your Visit  If you have any questions or concerns for your doctor, please call our main line at [phone] and press option 4 to reach your doctor's medical assistant. If no one answers, please leave a voicemail as directed and we will return your call as soon as possible. Messages left after 4 pm will be answered the following business day.   You may also send us a message via MyChart. We typically respond to MyChart messages within 1-2 business days.  For prescription refills, please ask your pharmacy to contact our office. Our fax number is [phone].  If you have an urgent issue when the clinic is closed that cannot wait until the next business day, you can page your doctor at the number below.    Please note that while we do our best to be available for urgent issues outside of office hours, we are not available 24/7.   If you have an urgent issue and are unable to reach us, you may choose to seek medical care at your doctor's office, retail clinic, urgent care center, or emergency room.  If you have a medical emergency, please immediately call 911 or go to the emergency department.  Pager Numbers  - Dr. Kowalski: [phone]  - Dr. Moye: [phone]  - Dr. Stewart:  [phone]  In the event of inclement weather, please call our main line at [phone] for an update on the status of any delays or closures.  Dermatology Medication Tips: Please keep the boxes that topical medications come in in order to help keep track of the instructions about where and how to use these. Pharmacies typically print the medication instructions only on the boxes and not directly on the medication tubes.   If your medication is too expensive, please contact our office at [phone] option 4 or send us a message through MyChart.   We are unable to tell what your co-pay for medications will be in advance as this is different depending on your insurance coverage. However, we may be able to find a substitute medication at lower cost or fill out paperwork to get insurance to cover a needed medication.   If a prior authorization is required to get your medication covered by your insurance company, please allow us 1-2 business days to complete this process.  Drug prices often vary depending on where the prescription is filled and some pharmacies may offer cheaper prices.  The website www.goodrx.com contains coupons for medications through different pharmacies. The prices here do not account for what the cost may be with help from insurance (it may be cheaper with your insurance), but the website can give you the price if you did not use any insurance.  - You can print the associated coupon and take it with   your prescription to the pharmacy.  - You may also stop by our office during regular business hours and pick up a GoodRx coupon card.  - If you need your prescription sent electronically to a different pharmacy, notify our office through [place] MyChart or by phone at [phone] option 4.     Si Usted Necesita Algo Despus de Su Visita  Tambin puede enviarnos un mensaje a travs de MyChart. Por lo general respondemos a los mensajes de MyChart en el transcurso de 1 a 2  das hbiles.  Para renovar recetas, por favor pida a su farmacia que se ponga en contacto con nuestra oficina. Nuestro nmero de fax es el [phone].  Si tiene un asunto urgente cuando la clnica est cerrada y que no puede esperar hasta el siguiente da hbil, puede llamar/localizar a su doctor(a) al nmero que aparece a continuacin.   Por favor, tenga en cuenta que aunque hacemos todo lo posible para estar disponibles para asuntos urgentes fuera del horario de oficina, no estamos disponibles las 24 horas del da, los 7 das de la semana.   Si tiene un problema urgente y no puede comunicarse con nosotros, puede optar por buscar atencin mdica  en el consultorio de su doctor(a), en una clnica privada, en un centro de atencin urgente o en una sala de emergencias.  Si tiene una emergencia mdica, por favor llame inmediatamente al 911 o vaya a la sala de emergencias.  Nmeros de bper  - Dr. Kowalski: [phone]  - Dra. Moye: [phone]  - Dra. Stewart: [phone]  En caso de inclemencias del tiempo, por favor llame a nuestra lnea principal al [phone] para una actualizacin sobre el estado de cualquier retraso o cierre.  Consejos para la medicacin en dermatologa: Por favor, guarde las cajas en las que vienen los medicamentos de uso tpico para ayudarle a seguir las instrucciones sobre dnde y cmo usarlos. Las farmacias generalmente imprimen las instrucciones del medicamento slo en las cajas y no directamente en los tubos del medicamento.   Si su medicamento es muy caro, por favor, pngase en contacto con nuestra oficina llamando al [phone] y presione la opcin 4 o envenos un mensaje a travs de MyChart.   No podemos decirle cul ser su copago por los medicamentos por adelantado ya que esto es diferente dependiendo de la cobertura de su seguro. Sin embargo, es posible que podamos encontrar un medicamento sustituto a menor costo o llenar un formulario para que el  seguro cubra el medicamento que se considera necesario.   Si se requiere una autorizacin previa para que su compaa de seguros cubra su medicamento, por favor permtanos de 1 a 2 das hbiles para completar este proceso.  Los precios de los medicamentos varan con frecuencia dependiendo del lugar de dnde se surte la receta y alguna farmacias pueden ofrecer precios ms baratos.  El sitio web www.goodrx.com tiene cupones para medicamentos de diferentes farmacias. Los precios aqu no tienen en cuenta lo que podra costar con la ayuda del seguro (puede ser ms barato con su seguro), pero el sitio web puede darle el precio si no utiliz ningn seguro.  - Puede imprimir el cupn correspondiente y llevarlo con su receta a la farmacia.  - Tambin puede pasar por nuestra oficina durante el horario de atencin regular y recoger una tarjeta de cupones de GoodRx.  - Si necesita que su receta se enve electrnicamente a una farmacia diferente, informe a nuestra oficina a travs de MyChart de [place]   o por telfono llamando al [phone] y presione la opcin 4.  

## 2022-03-11 NOTE — Progress Notes (Signed)
Follow-Up Visit   Subjective  Ashlee Cruz is a 56 y.o. female who presents for the following: Psoriasis (Pt tried to decrease Otezla to QD due to occasional h/a but her hands started cracking again so she increased back to BID. ). She used samples of Vtama but hasn't filled the prescription yet because she wasn't sure if she could cover her hands after applying it. Recheck ISK on the R clavicle. Patient has noticed a papule on her L nose and she is concerned it may be cancerous. It has been there for about a month.  The patient has spots, moles and lesions to be evaluated, some may be new or changing and the patient has concerns that these could be cancer.  The following portions of the chart were reviewed this encounter and updated as appropriate:   Tobacco  Allergies  Meds  Problems  Med Hx  Surg Hx  Fam Hx     Review of Systems:  No other skin or systemic complaints except as noted in HPI or Assessment and Plan.  Objective  Well appearing patient in no apparent distress; mood and affect are within normal limits.  A focused examination was performed including the face, trunk, hands, extremities. Relevant physical exam findings are noted in the Assessment and Plan.  R clavicle x 2 (2) Erythematous stuck-on, waxy papule or plaque  L inf mid lat nose at the junction with the cheek Pink/flesh colored papule 0.3 cm      Assessment & Plan  Psoriasis B/L hands  Chronic and persistent condition with duration or expected duration over one year. Condition is symptomatic / bothersome to patient. Not to goal and recent flare after decreasing dose. Patient states that significant improvement since staring [place] and the benefits out weigh the occasional h/a. Psoriasis is a chronic non-curable, but treatable genetic/hereditary disease that may have other systemic features affecting other organ systems such as joints (Psoriatic Arthritis). It is associated with an increased risk of  inflammatory bowel disease, heart disease, non-alcoholic fatty liver disease, and depression.    Discussed Sotyktu since patient continues to have h/a on [place]. Will need preliminary labs before starting prescriptions.   Discussed biologic injectable medications.   Patient declines other medications at this time and prefers to continue [place] '30mg'$  po BID. Side effects of Otezla (apremilast) include diarrhea, nausea, headache, upper respiratory infection, depression, and weight decrease (5-10%). It should only be taken by pregnant women after a discussion regarding risks and benefits with their doctor. Goal is control of skin condition, not cure.  The use of [place] requires long term medication management, including periodic office visits.  Restart Vtama cream QD OK to cover with occlusion/gloves at bedtime.   Related Medications Tapinarof (VTAMA) 1 % CREA Apply 1 application topically daily. Apply to affected pink areas of hands and body for psoriasis  Apremilast (OTEZLA) 30 MG TABS Take 1 tablet (30 mg total) by mouth 2 (two) times daily.  Inflamed seborrheic keratosis (2) R clavicle x 2 Symptomatic, irritating, patient would like treated. Destruction of lesion - R clavicle x 2 Complexity: simple   Destruction method: cryotherapy   Informed consent: discussed and consent obtained   Timeout:  patient name, date of birth, surgical site, and procedure verified Lesion destroyed using liquid nitrogen: Yes   Region frozen until ice ball extended beyond lesion: Yes   Outcome: patient tolerated procedure well with no complications   Post-procedure details: wound care instructions given    Fibrous papule  of face L inf mid lat nose at the junction with the cheek See photo Benign-appearing.  Observation.  Call clinic for new or changing lesions.  Recommend daily use of broad spectrum spf 30+ sunscreen to sun-exposed areas.   RTC if any changes and we will biopsy.  Seborrheic Keratoses -  Stuck-on, waxy, tan-brown papules and/or plaques  - Benign-appearing - Discussed benign etiology and prognosis. - Observe - Call for any changes  Actinic Damage - chronic, secondary to cumulative UV radiation exposure/sun exposure over time - diffuse scaly erythematous macules with underlying dyspigmentation - Recommend daily broad spectrum sunscreen SPF 30+ to sun-exposed areas, reapply every 2 hours as needed.  - Recommend staying in the shade or wearing long sleeves, sun glasses (UVA+UVB protection) and wide brim hats (4-inch brim around the entire circumference of the hat). - Call for new or changing lesions.  Return in about 6 months (around 09/11/2022) for TBSE.  Luther Redo, CMA, am acting as scribe for Sarina Ser, MD . Documentation: I have reviewed the above documentation for accuracy and completeness, and I agree with the above.  Sarina Ser, MD

## 2022-03-16 ENCOUNTER — Encounter: Payer: Self-pay | Admitting: Dermatology

## 2022-05-15 ENCOUNTER — Encounter: Payer: Self-pay | Admitting: Oncology

## 2022-05-15 NOTE — Telephone Encounter (Signed)
CLOSING OPEN ENCOUNTER 

## 2022-06-11 ENCOUNTER — Other Ambulatory Visit: Payer: Self-pay | Admitting: Dermatology

## 2022-06-11 DIAGNOSIS — L409 Psoriasis, unspecified: Secondary | ICD-10-CM

## 2022-06-26 ENCOUNTER — Other Ambulatory Visit: Payer: Self-pay | Admitting: Family Medicine

## 2022-06-26 NOTE — Telephone Encounter (Signed)
Requested medications are due for refill today.  yes  Requested medications are on the active medications list.  yes  Last refill. 06/20/2021 #180 1 rf  Future visit scheduled.   yes  Notes to clinic.  Refill not delegated.    Requested Prescriptions  Pending Prescriptions Disp Refills   methocarbamol (ROBAXIN) 500 MG tablet [Pharmacy Med Name: [id number] '500MG'$  TABLETS] 180 tablet 1    Sig: TAKE 1 TABLET(500 MG) BY MOUTH TWICE DAILY AS NEEDED FOR MUSCLE SPASMS     Not Delegated - Analgesics:  Muscle Relaxants Failed - 06/26/2022  5:15 PM      Failed - This refill cannot be delegated      Passed - Valid encounter within last 6 months    Recent Outpatient Visits           4 months ago Major depression in remission [facility])   [facility] Steele Sizer, MD   8 months ago Well adult exam   [facility] Steele Sizer, MD   1 year ago Major depression in remission [facility])   [facility] Steele Sizer, MD   1 year ago Chronic midline low back pain with left-sided sciatica   [facility] Steele Sizer, MD   1 year ago Acute midline low back pain with left-sided sciatica   [facility], Laurita Quint, FNP       Future Appointments             In 2 months Steele Sizer, MD [facility], [place]   In 2 months Ralene Bathe, MD [place]   In 4 months Steele Sizer, MD [facility], [facility]

## 2022-07-31 ENCOUNTER — Encounter: Payer: Self-pay | Admitting: Family Medicine

## 2022-07-31 ENCOUNTER — Ambulatory Visit: Payer: Managed Care, Other (non HMO) | Admitting: Family Medicine

## 2022-07-31 VITALS — BP 112/74 | HR 91 | Temp 98.2°F | Resp 16 | Ht 60.0 in | Wt 157.8 lb

## 2022-07-31 DIAGNOSIS — R051 Acute cough: Secondary | ICD-10-CM | POA: Diagnosis not present

## 2022-07-31 DIAGNOSIS — B349 Viral infection, unspecified: Secondary | ICD-10-CM

## 2022-07-31 DIAGNOSIS — Z20828 Contact with and (suspected) exposure to other viral communicable diseases: Secondary | ICD-10-CM | POA: Diagnosis not present

## 2022-07-31 MED ORDER — BENZONATATE 100 MG PO CAPS: 100.0000 mg | ORAL_CAPSULE | Freq: Two times a day (BID) | ORAL | 0 refills | Status: DC | PRN

## 2022-07-31 MED ORDER — HYDROCOD POLI-CHLORPHE POLI ER 10-8 MG/5ML PO SUER: 5.0000 mL | Freq: Two times a day (BID) | ORAL | 0 refills | Status: DC | PRN

## 2022-07-31 NOTE — Progress Notes (Signed)
Name: Ashlee Cruz   MRN: [id number]    DOB: 25-Sep-1965   Date:07/31/2022       Progress Note  Subjective  Chief Complaint  Chief Complaint  Patient presents with   Nasal Congestion    Stuffed up   Headache    Facial pressure above and below eyes, onset since Sunday. Pt states son was dx w Flu on Monday but she has not had a fever.   Cough    Gets worst at night    HPI  Viral illness: she states she developed rhinorrhea and nasal congestion 4 days ago. She states son diagnosed with the flu three days ago. She has noticed increase in facial pressure , she has post-nasal drainage and a cough that is sometimes dry and sometimes wet. She has been taking terraflu without help. She has noticed change in appetite and taste, she also  has fatigue. She states the borders of her tongue feels numb, very mild scratchy throat   Denies fever, chills, nausea , vomiting or diarrhea.   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   Dyslipidemia 25/95/6387   Dysmetabolic syndrome 56/43/3295   Bariatric surgery status 04/26/2015   H/O: HTN (hypertension) 04/26/2015   Psoriasis 04/26/2015   Allergic rhinitis, seasonal 04/26/2015   Vitamin D deficiency 04/26/2015   Bilateral knee pain 04/26/2015    Social History   Tobacco Use   Smoking status: Never   Smokeless tobacco: Never  Substance Use Topics   Alcohol use: No    Alcohol/week: 0.0 standard drinks of alcohol     Current Outpatient Medications:    acetaminophen (TYLENOL) 500 MG tablet, Take 1,000 mg by mouth as needed for moderate pain., Disp: , Rfl:    cholecalciferol (VITAMIN D3) 25 MCG (1000 UT) tablet,  Take 2,000 Units by mouth daily., Disp: , Rfl:    cyanocobalamin (,VITAMIN B-12,) 1000 MCG/ML injection, Inject 1 mL (1,000 mcg total) into the muscle once a week for 30 days, THEN 1 mL (1,000 mcg total) every 30 (thirty) days., Disp: 1 mL, Rfl: 10   cyanocobalamin (,VITAMIN B-12,) 1000 MCG/ML injection, Inject 1 mL (1,000 mcg total) into the muscle once a week for 30 days, THEN 1 mL (1,000 mcg total) every 30 (thirty) days., Disp: 1 mL, Rfl: 11   diphenhydrAMINE HCl (BENADRYL ALLERGY PO), Take by mouth. PRN, Disp: , Rfl:    DULoxetine (CYMBALTA) 60 MG capsule, Take 1 capsule (60 mg total) by mouth daily., Disp: 90 capsule, Rfl: 1   metFORMIN (GLUCOPHAGE-XR) 500 MG 24 hr tablet, Take 1 tablet (500 mg total) by mouth daily with breakfast., Disp: 90 tablet, Rfl: 1   methocarbamol (ROBAXIN) 500 MG tablet, TAKE 1 TABLET(500 MG) BY MOUTH TWICE DAILY AS NEEDED FOR MUSCLE SPASMS, Disp: 180 tablet, Rfl: 1   OTEZLA 30 MG TABS, TAKE 1 TABLET BY MOUTH TWICE  DAILY, Disp: 60 tablet, Rfl: 3   Syringe/Needle, Disp, (SYRINGE 3CC/25GX5/8") 25G X 5/8" 3 ML MISC, 3 mLs by Does not apply route every 30 (thirty) days., Disp: 11 each, Rfl: 0   Tapinarof (VTAMA) 1 % CREA, Apply 1 application topically daily. Apply to affected pink areas of hands  and body for psoriasis, Disp: 60 g, Rfl: 6  Allergies  Allergen Reactions   Other Other (See Comments)    Any fragrant causes psoriasus   Latex     Pt reports prolonged exposure causes aggravation in psoriasis   Codeine Rash    ROS  Ten systems reviewed and is negative except as mentioned in HPI   Objective  Vitals:   07/31/22 0957  BP: 112/74  Pulse: 91  Resp: 16  Temp: 98.2 F (36.8 C)  TempSrc: Oral  SpO2: 98%  Weight: 157 lb 12.8 oz (71.6 kg)  Height: 5' (1.524 m)    Body mass index is 30.82 kg/m.    Physical Exam  Constitutional: Patient appears well-developed and well-nourished. Obese  No distress.  HEENT: head atraumatic, normocephalic, pupils  equal and reactive to light, ears normal TM,, neck supple, soft palate has erythematous patches , boggy turbinates with clear rhinorrhea Cardiovascular: Normal rate, regular rhythm and normal heart sounds.  No murmur heard. No BLE edema. Pulmonary/Chest: Effort normal and breath sounds normal. No respiratory distress. Abdominal: Soft.  There is no tenderness. Psychiatric: Patient has a normal mood and affect. behavior is normal. Judgment and thought content normal.    Assessment & Plan  1. Viral illness  - COVID-19, Flu A+B and RSV - chlorpheniramine-HYDROcodone (TUSSIONEX) 10-8 MG/5ML; Take 5 mLs by mouth every 12 (twelve) hours as needed for cough.  Dispense: 140 mL; Refill: 0 - benzonatate (TESSALON) 100 MG capsule; Take 1 capsule (100 mg total) by mouth 2 (two) times daily as needed for cough.  Dispense: 40 capsule; Refill: 0  2. Acute cough  - chlorpheniramine-HYDROcodone (TUSSIONEX) 10-8 MG/5ML; Take 5 mLs by mouth every 12 (twelve) hours as needed for cough.  Dispense: 140 mL; Refill: 0 - benzonatate (TESSALON) 100 MG capsule; Take 1 capsule (100 mg total) by mouth 2 (two) times daily as needed for cough.  Dispense: 40 capsule; Refill: 0  3. Exposure to the flu  - chlorpheniramine-HYDROcodone ([place]) 10-8 MG/5ML; Take 5 mLs by mouth every 12 (twelve) hours as needed for cough.  Dispense: 140 mL; Refill: 0 - benzonatate (TESSALON) 100 MG capsule; Take 1 capsule (100 mg total) by mouth 2 (two) times daily as needed for cough.  Dispense: 40 capsule; Refill: 0

## 2022-08-02 IMAGING — MG MM DIGITAL SCREENING BILAT W/ TOMO AND CAD
8 series · 8 of 24 positions shown · non-contrast
Comparison: Previous exam(s).

CLINICAL DATA: Screening.

EXAM:
DIGITAL SCREENING BILATERAL MAMMOGRAM WITH TOMOSYNTHESIS AND CAD
TECHNIQUE: Bilateral screening digital craniocaudal and mediolateral oblique
mammograms were obtained. Bilateral screening digital breast
tomosynthesis was performed. The images were evaluated with
computer-aided detection.

[L MLO synth-2D]
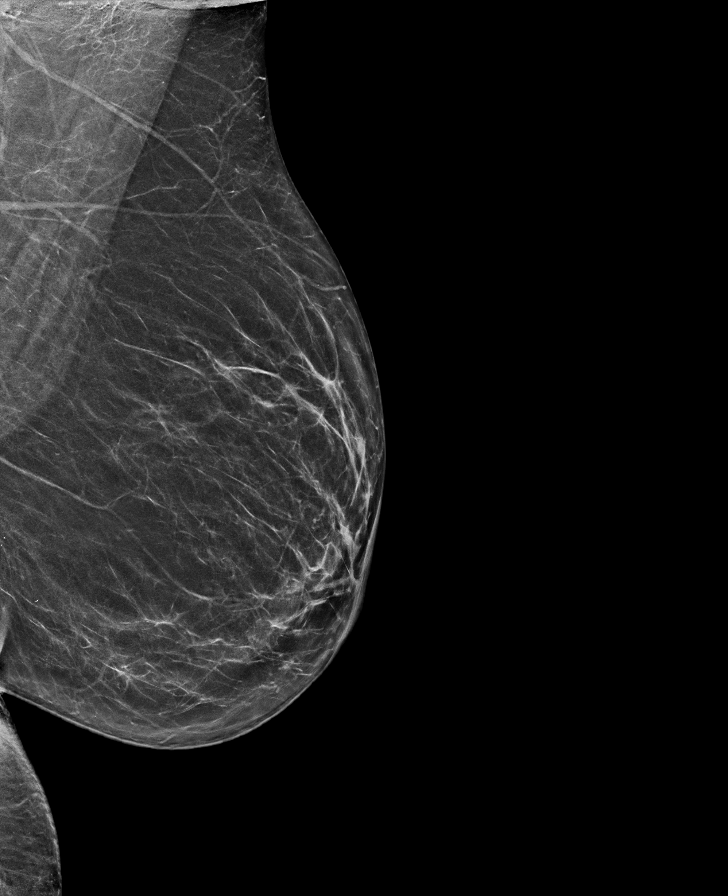

[R MLO synth-2D]
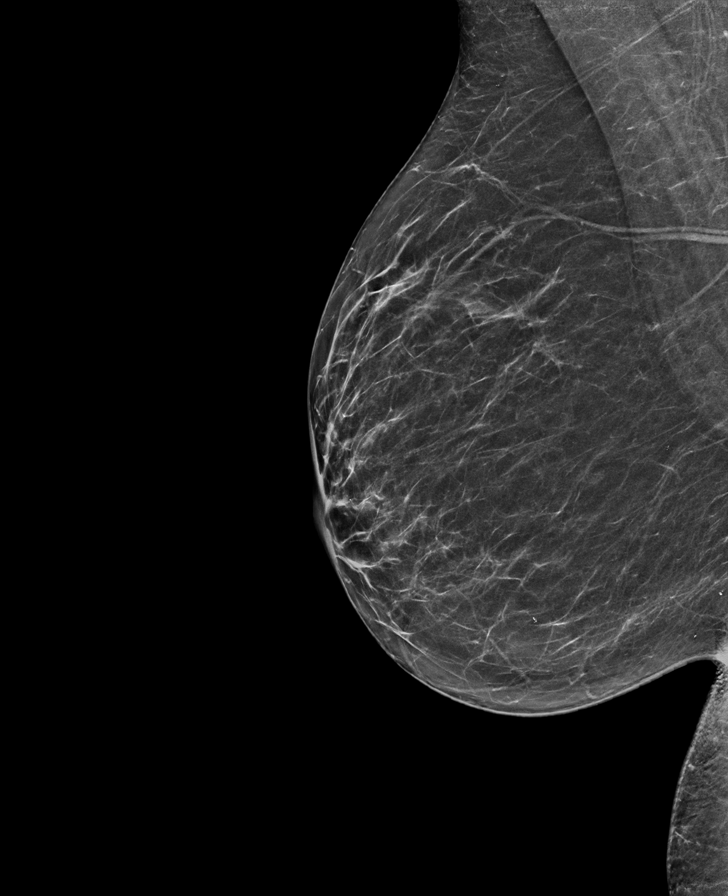

[L CC synth-2D]
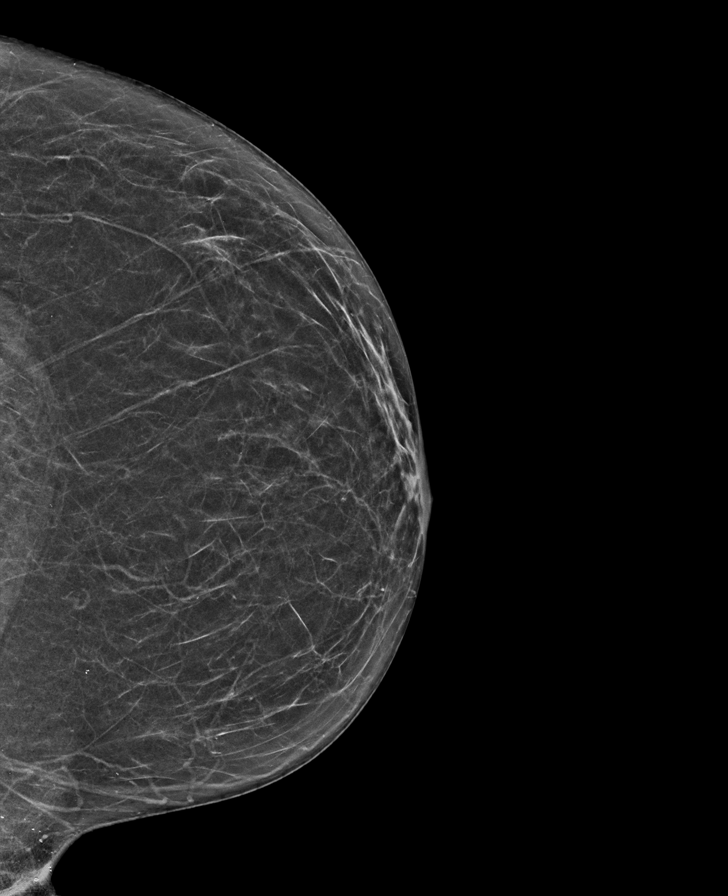

[R CC synth-2D]
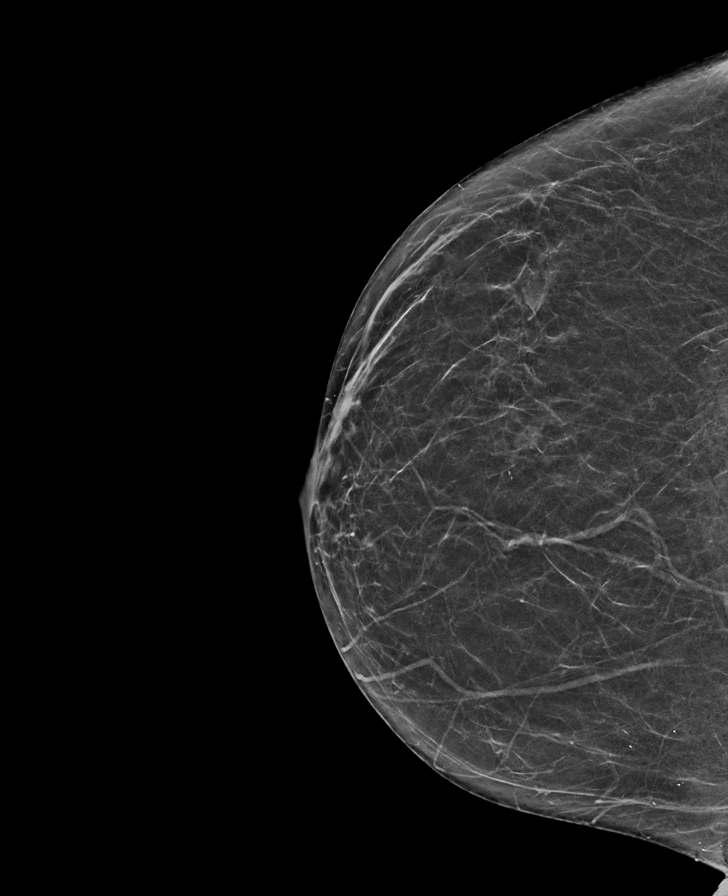

[L CC tomo · tomo slice 27/54.0]
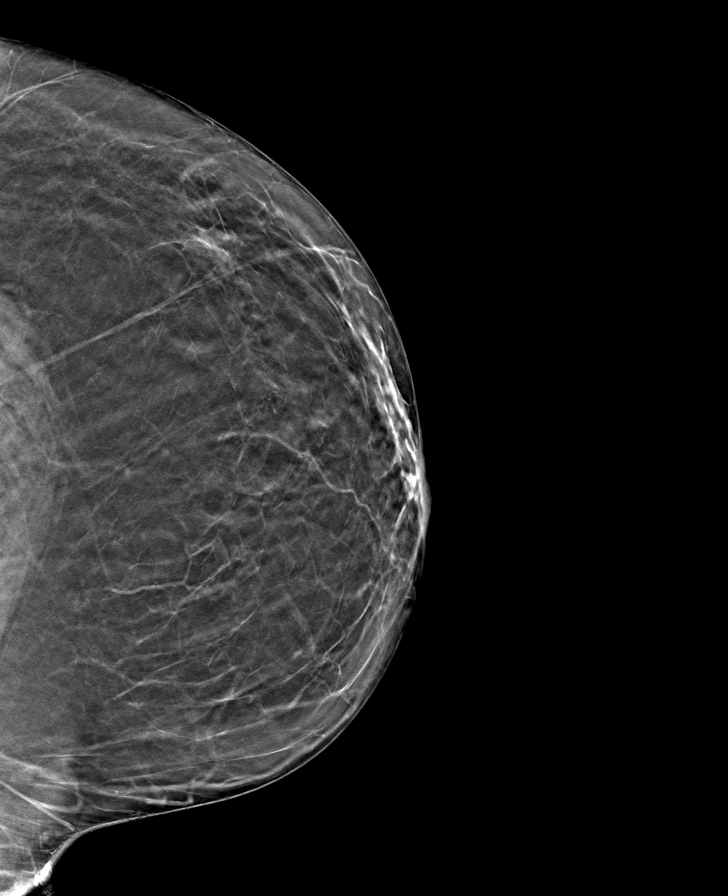

[R MLO tomo · tomo slice 32/63.0]
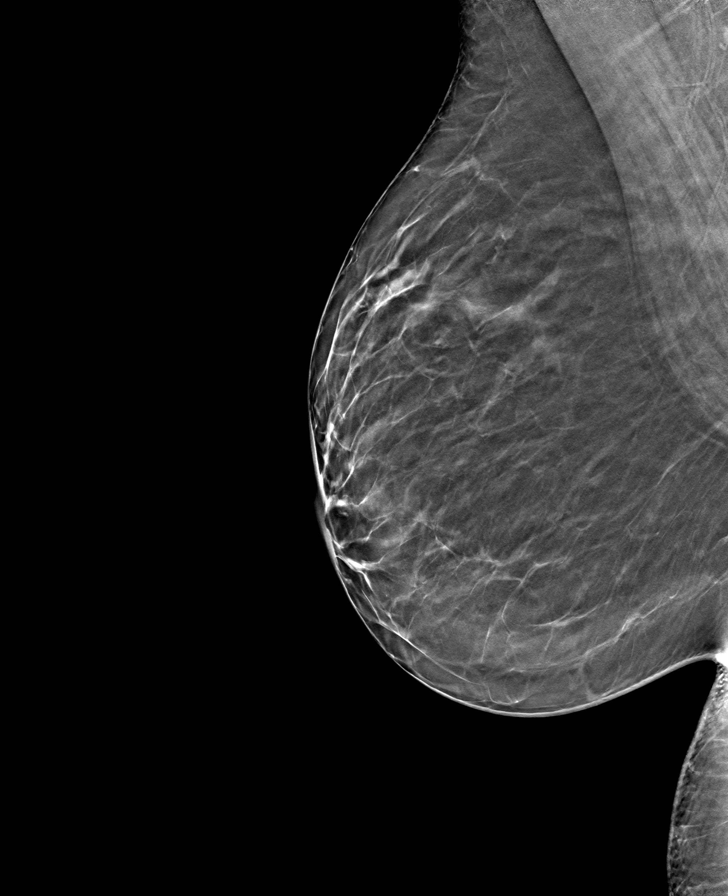

[R CC tomo · tomo slice 29/56.0]
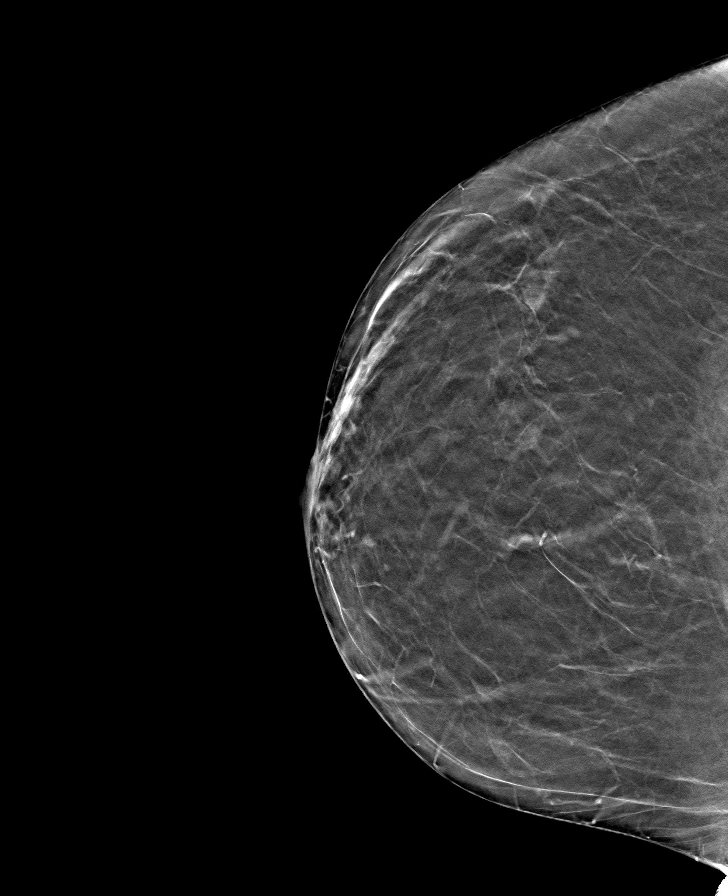

[L MLO tomo · tomo slice 33/66.0]
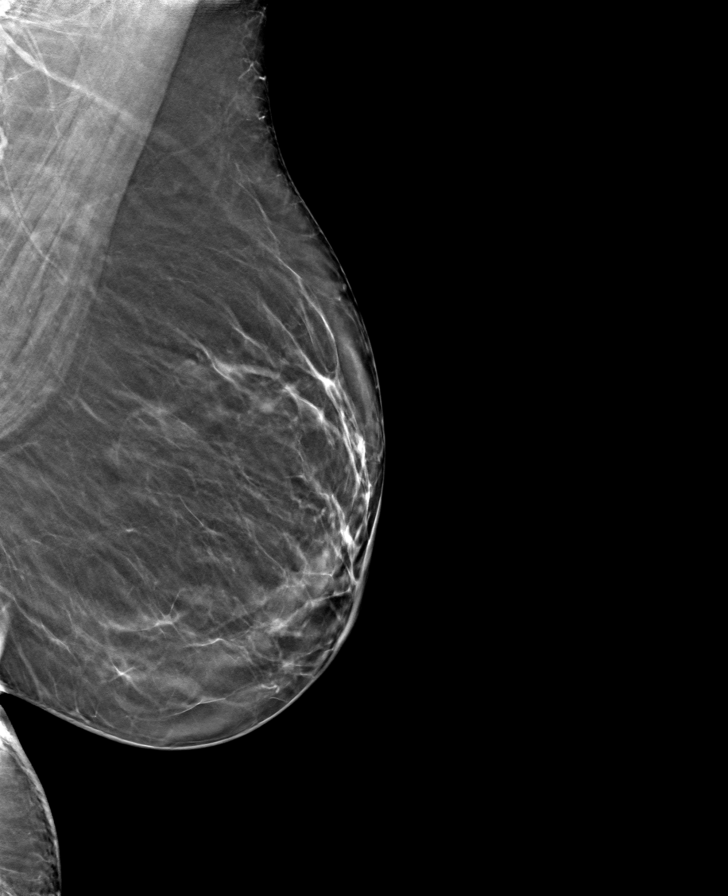

[8 of 24 positions shown; findings below may reference images not displayed]

ACR Breast Density Category b: There are scattered areas of
fibroglandular density.
FINDINGS: There are no findings suspicious for malignancy.
IMPRESSION: No mammographic evidence of malignancy. A result letter of this
screening mammogram will be mailed directly to the patient.

RECOMMENDATION:
Screening mammogram in one year. (Code:51-O-LD2)

BI-RADS CATEGORY  1: Negative.

## 2022-08-22 NOTE — Progress Notes (Unsigned)
Name: Ashlee Cruz   MRN: [id number]    DOB: October 08, 1965   Date:08/25/2022       Progress Note  Subjective  Chief Complaint  Follow Up  HPI  Pre-diabetes: last A1C was 5.7% and today is 5.5 % . She has been more active lately, she denies polyphagia, polydipsia or polyphagia. 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 Consuella Lose  in July 2017 and was doing great, but a few months after the surgery she had a flare. She was seen by Dr. Kathyrn Sheriff and had steroid injection end of November 2017 and symptoms improved a little, she had another injection January 2018 She states symptoms are intermittent and able to control with medications. She realized flat pillow is best for her neck   Dyslipidemia: LDL has improved, down from 146 to 127 , we will recheck labs today  Toothache : 6 months ago she started to notice  pain on left upper tooth that was  radiating to left ear, sometimes radiating to  left side of nose . 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  She still worries about her mother that lives in [place]. She struggled during the holidays but doing better now    Palpitation: she has intermittent symptoms, occasionally gets dizzy when she gets up quickly, but no chest pain . Stable    B12 and vitamin D deficiency: very low B12 at 167 - checked by hematologist and is now getting monthly injections,  continue vitamin D otc. We will recheck labs   Eczema: both hands , well controlled with medication, sees Dr. Nehemiah Massed . Doing well   Patient Active Problem List   Diagnosis Date Noted   Iron deficiency anemia 05/02/2020   Tachycardia 05/19/2017   Palpitations 05/19/2017   Fibroid uterus 12/20/2015   History of iron deficiency anemia 10/23/2015   Left lumbar radiculitis 10/23/2015   Absence of menstruation 04/26/2015   B12 deficiency 04/26/2015   Depression, major, recurrent, mild ([place]) 04/26/2015   Dyslipidemia 35/36/1443   Dysmetabolic syndrome 15/40/0867   Bariatric surgery status 04/26/2015   H/O: HTN (hypertension) 04/26/2015   Psoriasis 04/26/2015   Allergic rhinitis, seasonal 04/26/2015   Vitamin D deficiency 04/26/2015   Bilateral knee pain 04/26/2015    Past Surgical History:  Procedure Laterality Date   BARIATRIC SURGERY     CHOLECYSTECTOMY     COLONOSCOPY WITH PROPOFOL N/A 05/07/2020   Procedure: COLONOSCOPY WITH PROPOFOL;  Surgeon: Virgel Manifold, MD;  Location: [facility];  Service: Endoscopy;  Laterality: N/A;   DILATION AND CURETTAGE OF UTERUS     ESOPHAGOGASTRODUODENOSCOPY (EGD) WITH PROPOFOL N/A 05/07/2020   Procedure: ESOPHAGOGASTRODUODENOSCOPY (EGD) WITH BIOPSY;  Surgeon: Virgel Manifold, MD;  Location: [facility];  Service: Endoscopy;  Laterality: N/A;   LUMBAR SPINE SURGERY Left 02/2016   Pt reports lumbar surgery without placement of instrumentation to decompress nerve on left side.     Family History  Problem Relation Age of Onset   Diabetes Mother    Skin cancer Mother    Diabetes Father    COPD Father    Seizures Father    Heart disease Father 53       CABG   Diabetes Brother    Multiple sclerosis Brother    Stroke Paternal Grandmother     Social History   Tobacco Use   Smoking status: Never   Smokeless tobacco: Never  Substance Use Topics   Alcohol use: No    Alcohol/week: 0.0 standard drinks of alcohol      Current Outpatient Medications:    acetaminophen (TYLENOL) 500 MG tablet, Take 1,000 mg by mouth as needed for moderate pain., Disp: , Rfl:    cholecalciferol (VITAMIN D3) 25 MCG (1000 UT) tablet, Take 2,000 Units by mouth daily., Disp: , Rfl:    cyanocobalamin (,VITAMIN B-12,) 1000 MCG/ML injection, Inject 1 mL (1,000 mcg total) into the muscle once a week for 30 days, THEN 1 mL (1,000 mcg total) every 30 (thirty) days., Disp: 1 mL, Rfl: 11   diphenhydrAMINE HCl (BENADRYL ALLERGY PO), Take by mouth. PRN, Disp: , Rfl:    DULoxetine (CYMBALTA) 60 MG capsule, Take 1 capsule (60 mg total) by mouth daily., Disp: 90 capsule, Rfl: 1   metFORMIN (GLUCOPHAGE-XR) 500 MG 24 hr tablet, Take 1 tablet (500 mg total) by mouth daily with breakfast., Disp: 90 tablet, Rfl: 1   methocarbamol (ROBAXIN) 500 MG tablet, TAKE 1 TABLET(500 MG) BY MOUTH TWICE DAILY AS NEEDED FOR MUSCLE SPASMS, Disp: 180 tablet, Rfl: 1   OTEZLA 30 MG TABS, TAKE 1 TABLET BY MOUTH TWICE  DAILY, Disp: 60 tablet, Rfl: 3   Syringe/Needle, Disp, (SYRINGE 3CC/25GX5/8") 25G X 5/8" 3 ML MISC, 3 mLs by Does not apply route every 30 (thirty) days., Disp: 11 each, Rfl: 0   Tapinarof (VTAMA) 1 % CREA, Apply 1 application topically daily. Apply to affected pink areas of hands and body for psoriasis, Disp: 60 g, Rfl: 6  Allergies  Allergen Reactions   Other Other (See Comments)    Any fragrant causes psoriasus   Latex     Pt reports prolonged exposure causes aggravation in psoriasis   Codeine Rash    I personally reviewed active problem list, medication list, allergies, family history, social history, health maintenance with the patient/caregiver today.   ROS  Constitutional: Negative for fever or weight change.  Respiratory: Negative for cough and shortness of breath.   Cardiovascular: Negative for chest pain or palpitations.  Gastrointestinal: Negative for abdominal pain, no bowel changes.  Musculoskeletal: Negative for gait problem  or joint swelling.  Skin: positive  for rash.  Neurological: Negative for dizziness or headache.  No other specific complaints in a complete review of systems (except as listed in HPI above).   Objective  Vitals:   08/25/22 0758  BP: 122/70  Pulse: 92  Resp: 16  SpO2: 98%  Weight: 161 lb (73 kg)  Height: 5' (1.524 m)    Body mass index is 31.44 kg/m.  Physical Exam  Constitutional: Patient appears well-developed and well-nourished. Obese  No distress.  HEENT: head atraumatic, normocephalic, pupils equal and reactive to light, ears normal , neck supple. Nose: boggy left turbinate  Cardiovascular: Normal rate, regular rhythm and normal heart  sounds.  No murmur heard. No BLE edema. Pulmonary/Chest: Effort normal and breath sounds normal. No respiratory distress. Abdominal: Soft.  There is no tenderness. Skin: mild cracks on both palms, very mild  Psychiatric: Patient has a normal mood and affect. behavior is normal. Judgment and thought content normal.    PHQ2/9:    08/25/2022    7:58 AM 07/31/2022    9:48 AM 02/20/2022    9:41 AM 10/24/2021    9:18 AM 06/20/2021    8:10 AM  Depression screen PHQ 2/9  Decreased Interest 0 0 0 0 0  Down, Depressed, Hopeless 0 0 0 1 0  PHQ - 2 Score 0 0 0 1 0  Altered sleeping 0 0 0 2 0  Tired, decreased energy 0 0 0 0 0  Change in appetite 0 0 0 0 0  Feeling bad or failure about yourself  0 0 0 0 0  Trouble concentrating 0 0 0 0 0  Moving slowly or fidgety/restless 0 0 0 0 0  Suicidal thoughts 0 0 0 0 0  PHQ-9 Score 0 0 0 3 0  Difficult doing work/chores  Not difficult at all       phq 9 is negative   Fall Risk:    08/25/2022    7:58 AM 07/31/2022    9:48 AM 02/20/2022    9:41 AM 10/24/2021    9:17 AM 06/20/2021    8:10 AM  Fall Risk   Falls in the past year? 0 0 0 0 0  Number falls in past yr: 0 0 0 0 0  Injury with Fall? 0 0 0 0 0  Risk for fall due to : No Fall Risks No Fall Risks No Fall Risks No Fall Risks No Fall Risks  Follow  up Falls prevention discussed Falls prevention discussed;Education provided;Falls evaluation completed Falls prevention discussed Falls prevention discussed Falls prevention discussed    Functional Status Survey: Is the patient deaf or have difficulty hearing?: No Does the patient have difficulty seeing, even when wearing glasses/contacts?: No Does the patient have difficulty concentrating, remembering, or making decisions?: No Does the patient have difficulty walking or climbing stairs?: No Does the patient have difficulty dressing or bathing?: No Does the patient have difficulty doing errands alone such as visiting a doctor's office or shopping?: No    Assessment & Plan  1. Major depression in remission (HCC)  - DULoxetine (CYMBALTA) 60 MG capsule; Take 1 capsule (60 mg total) by mouth daily.  Dispense: 90 capsule; Refill: 1  2. Left facial pressure and pain  - Ambulatory referral to ENT  3. Chronic nasal congestion  - Ambulatory referral to ENT  4. Dyslipidemia  - Lipid panel  5. Dysmetabolic syndrome  - Hemoglobin A1c - metFORMIN (GLUCOPHAGE-XR) 500 MG 24 hr tablet; Take 1 tablet (500 mg total) by mouth daily with breakfast.  Dispense: 90 tablet; Refill: 1  6. B12 deficiency  - CBC with Differential/Platelet - Iron, TIBC and Ferritin Panel - B12 and Folate Panel  7. Vitamin D deficiency  - VITAMIN D 25 Hydroxy (Vit-D Deficiency, Fractures)  8. Bariatric surgery status  - CBC with Differential/Platelet - Iron, TIBC and Ferritin Panel - metFORMIN (GLUCOPHAGE-XR) 500 MG 24 hr tablet; Take 1 tablet (500 mg total) by mouth daily with breakfast.  Dispense: 90 tablet; Refill: 1  9. Long-term use of high-risk medication  - CBC with Differential/Platelet - Comprehensive metabolic panel  10. History of iron deficiency anemia   11.  Chronic midline low back pain with left-sided sciatica  - DULoxetine (CYMBALTA) 60 MG capsule; Take 1 capsule (60 mg total) by mouth  daily.  Dispense: 90 capsule; Refill: 1

## 2022-08-25 ENCOUNTER — Encounter: Payer: Self-pay | Admitting: Family Medicine

## 2022-08-25 ENCOUNTER — Ambulatory Visit: Payer: Managed Care, Other (non HMO) | Admitting: Family Medicine

## 2022-08-25 VITALS — BP 122/70 | HR 92 | Resp 16 | Ht 60.0 in | Wt 161.0 lb

## 2022-08-25 DIAGNOSIS — R519 Headache, unspecified: Secondary | ICD-10-CM

## 2022-08-25 DIAGNOSIS — Z79899 Other long term (current) drug therapy: Secondary | ICD-10-CM

## 2022-08-25 DIAGNOSIS — E785 Hyperlipidemia, unspecified: Secondary | ICD-10-CM

## 2022-08-25 DIAGNOSIS — E8881 Metabolic syndrome: Secondary | ICD-10-CM

## 2022-08-25 DIAGNOSIS — Z9884 Bariatric surgery status: Secondary | ICD-10-CM

## 2022-08-25 DIAGNOSIS — F325 Major depressive disorder, single episode, in full remission: Secondary | ICD-10-CM | POA: Diagnosis not present

## 2022-08-25 DIAGNOSIS — R0981 Nasal congestion: Secondary | ICD-10-CM | POA: Diagnosis not present

## 2022-08-25 DIAGNOSIS — M5442 Lumbago with sciatica, left side: Secondary | ICD-10-CM

## 2022-08-25 DIAGNOSIS — Z862 Personal history of diseases of the blood and blood-forming organs and certain disorders involving the immune mechanism: Secondary | ICD-10-CM

## 2022-08-25 DIAGNOSIS — E559 Vitamin D deficiency, unspecified: Secondary | ICD-10-CM

## 2022-08-25 DIAGNOSIS — G8929 Other chronic pain: Secondary | ICD-10-CM

## 2022-08-25 DIAGNOSIS — E538 Deficiency of other specified B group vitamins: Secondary | ICD-10-CM

## 2022-08-25 MED ORDER — DULOXETINE HCL 60 MG PO CPEP: 60.0000 mg | ORAL_CAPSULE | Freq: Every day | ORAL | 1 refills | Status: DC

## 2022-08-25 MED ORDER — METFORMIN HCL ER 500 MG PO TB24: 500.0000 mg | ORAL_TABLET | Freq: Every day | ORAL | 1 refills | Status: DC

## 2022-09-21 LAB — IRON,TIBC AND FERRITIN PANEL
Ferritin: 46 ng/mL (ref 15–150)
Iron Saturation: 35 % (ref 15–55)
Iron: 142 ug/dL (ref 27–159)
Total Iron Binding Capacity: 404 ug/dL (ref 250–450)
UIBC: 262 ug/dL (ref 131–425)

## 2022-09-21 LAB — COMPREHENSIVE METABOLIC PANEL
ALT: 21 IU/L (ref 0–32)
AST: 25 IU/L (ref 0–40)
Albumin/Globulin Ratio: 2.3 — ABNORMAL HIGH (ref 1.2–2.2)
Albumin: 4.8 g/dL (ref 3.8–4.9)
Alkaline Phosphatase: 100 IU/L (ref 44–121)
BUN/Creatinine Ratio: 20 (ref 9–23)
BUN: 13 mg/dL (ref 6–24)
Bilirubin Total: 0.9 mg/dL (ref 0.0–1.2)
CO2: 25 mmol/L (ref 20–29)
Calcium: 9.7 mg/dL (ref 8.7–10.2)
Chloride: 102 mmol/L (ref 96–106)
Creatinine, Ser: 0.66 mg/dL (ref 0.57–1.00)
Globulin, Total: 2.1 g/dL (ref 1.5–4.5)
Glucose: 87 mg/dL (ref 70–99)
Potassium: 4.5 mmol/L (ref 3.5–5.2)
Sodium: 143 mmol/L (ref 134–144)
Total Protein: 6.9 g/dL (ref 6.0–8.5)
eGFR: 104 mL/min/{1.73_m2} (ref >59)

## 2022-09-21 LAB — LIPID PANEL
Chol/HDL Ratio: 2.5 ratio (ref 0.0–4.4)
Cholesterol, Total: 248 mg/dL — ABNORMAL HIGH (ref 100–199)
HDL: 99 mg/dL (ref >39)
LDL Chol Calc (NIH): 137 mg/dL — ABNORMAL HIGH (ref 0–99)
Triglycerides: 70 mg/dL (ref 0–149)
VLDL Cholesterol Cal: 12 mg/dL (ref 5–40)

## 2022-09-21 LAB — CBC WITH DIFFERENTIAL/PLATELET
Basophils Absolute: 0 10*3/uL (ref 0.0–0.2)
Basos: 1 %
EOS (ABSOLUTE): 0.3 10*3/uL (ref 0.0–0.4)
Eos: 7 %
Hematocrit: 45.6 % (ref 34.0–46.6)
Hemoglobin: 15.5 g/dL (ref 11.1–15.9)
Immature Grans (Abs): 0 10*3/uL (ref 0.0–0.1)
Immature Granulocytes: 0 %
Lymphocytes Absolute: 1.3 10*3/uL (ref 0.7–3.1)
Lymphs: 29 %
MCH: 30.9 pg (ref 26.6–33.0)
MCHC: 34 g/dL (ref 31.5–35.7)
MCV: 91 fL (ref 79–97)
Monocytes Absolute: 0.3 10*3/uL (ref 0.1–0.9)
Monocytes: 7 %
Neutrophils Absolute: 2.6 10*3/uL (ref 1.4–7.0)
Neutrophils: 56 %
Platelets: 346 10*3/uL (ref 150–450)
RBC: 5.02 x10E6/uL (ref 3.77–5.28)
RDW: 11.5 % — ABNORMAL LOW (ref 11.7–15.4)
WBC: 4.5 10*3/uL (ref 3.4–10.8)

## 2022-09-21 LAB — B12 AND FOLATE PANEL
Folate: >20 ng/mL (ref >3.0)
Vitamin B-12: 1034 pg/mL (ref 232–1245)

## 2022-09-21 LAB — VITAMIN D 25 HYDROXY (VIT D DEFICIENCY, FRACTURES): Vit D, 25-Hydroxy: 64.2 ng/mL (ref 30.0–100.0)

## 2022-09-21 LAB — HEMOGLOBIN A1C
Est. average glucose Bld gHb Est-mCnc: 111 mg/dL
Hgb A1c MFr Bld: 5.5 % (ref 4.8–5.6)

## 2022-09-22 ENCOUNTER — Ambulatory Visit: Payer: Managed Care, Other (non HMO) | Admitting: Dermatology

## 2022-09-22 VITALS — BP 110/78 | HR 86

## 2022-09-22 DIAGNOSIS — Z808 Family history of malignant neoplasm of other organs or systems: Secondary | ICD-10-CM

## 2022-09-22 DIAGNOSIS — L814 Other melanin hyperpigmentation: Secondary | ICD-10-CM

## 2022-09-22 DIAGNOSIS — Z1283 Encounter for screening for malignant neoplasm of skin: Secondary | ICD-10-CM | POA: Diagnosis not present

## 2022-09-22 DIAGNOSIS — Z79899 Other long term (current) drug therapy: Secondary | ICD-10-CM | POA: Diagnosis not present

## 2022-09-22 DIAGNOSIS — L578 Other skin changes due to chronic exposure to nonionizing radiation: Secondary | ICD-10-CM

## 2022-09-22 DIAGNOSIS — L409 Psoriasis, unspecified: Secondary | ICD-10-CM | POA: Diagnosis not present

## 2022-09-22 DIAGNOSIS — D1801 Hemangioma of skin and subcutaneous tissue: Secondary | ICD-10-CM

## 2022-09-22 DIAGNOSIS — D229 Melanocytic nevi, unspecified: Secondary | ICD-10-CM

## 2022-09-22 MED ORDER — OTEZLA 30 MG PO TABS: 1.0000 | ORAL_TABLET | Freq: Two times a day (BID) | ORAL | 6 refills | Status: DC

## 2022-09-22 NOTE — Progress Notes (Unsigned)
Follow-Up Visit   Subjective  Ashlee Cruz is a 57 y.o. female who presents for the following: Psoriasis (Doing well with Otezla 30 mg po BID, some occasional headaches but no depression or mood changes. Topically she uses Epionce medical barrier cream. ) and Annual Exam. The patient presents for Total-Body Skin Exam (TBSE) for skin cancer screening and mole check.  The patient has spots, moles and lesions to be evaluated, some may be new or changing and the patient has concerns that these could be cancer.  The following portions of the chart were reviewed this encounter and updated as appropriate:   Tobacco  Allergies  Meds  Problems  Med Hx  Surg Hx  Fam Hx     Review of Systems:  No other skin or systemic complaints except as noted in HPI or Assessment and Plan.  Objective  Well appearing patient in no apparent distress; mood and affect are within normal limits.  A full examination was performed including scalp, head, eyes, ears, nose, lips, neck, chest, axillae, abdomen, back, buttocks, bilateral upper extremities, bilateral lower extremities, hands, feet, fingers, toes, fingernails, and toenails. All findings within normal limits unless otherwise noted below.  B/L hands Pinkness and scale of the L palm. Hyperkeratosis mostly of the heels.    Assessment & Plan  Psoriasis B/L hands Chronic and persistent condition with duration or expected duration over one year. Condition is bothersome/symptomatic for patient. Currently flared. Flared when she tried to decrease Otezla to '30mg'$  po QD.   Counseling on psoriasis and coordination of care  psoriasis is a chronic non-curable, but treatable genetic/hereditary disease that may have other systemic features affecting other organ systems such as joints (Psoriatic Arthritis). It is associated with an increased risk of inflammatory bowel disease, heart disease, non-alcoholic fatty liver disease, and depression.  Treatments include light  and laser treatments; topical medications; and systemic medications including oral and injectables.  Continue Otezla 30 mg po BID. Side effects of Otezla (apremilast) include diarrhea, nausea, headache, upper respiratory infection, depression, and weight decrease (5-10%). It should only be taken by pregnant women after a discussion regarding risks and benefits with their doctor. Goal is control of skin condition, not cure.  The use of [place] requires long term medication management, including periodic office visits.  Continue Epionce medical barrier cream daily.  Long term medication management.  Patient is using long term (months to years) prescription medication  to control their dermatologic condition.  These medications require periodic monitoring to evaluate for efficacy and side effects and may require periodic laboratory monitoring.  Related Medications Tapinarof (VTAMA) 1 % CREA Apply 1 application topically daily. Apply to affected pink areas of hands and body for psoriasis Apremilast (OTEZLA) 30 MG TABS Take 1 tablet (30 mg total) by mouth 2 (two) times daily.  Lentigines - Scattered tan macules - Due to sun exposure - Benign-appearing, observe - Recommend daily broad spectrum sunscreen SPF 30+ to sun-exposed areas, reapply every 2 hours as needed. - Call for any changes  Seborrheic Keratoses - Stuck-on, waxy, tan-brown papules and/or plaques  - Benign-appearing - Discussed benign etiology and prognosis. - Observe - Call for any changes  Melanocytic Nevi - Tan-brown and/or pink-flesh-colored symmetric macules and papules - Benign appearing on exam today - Observation - Call clinic for new or changing moles - Recommend daily use of broad spectrum spf 30+ sunscreen to sun-exposed areas.   Hemangiomas - Red papules - Discussed benign nature - Observe - Call for  any changes  Actinic Damage - Chronic condition, secondary to cumulative UV/sun exposure - diffuse scaly  erythematous macules with underlying dyspigmentation - Recommend daily broad spectrum sunscreen SPF 30+ to sun-exposed areas, reapply every 2 hours as needed.  - Staying in the shade or wearing long sleeves, sun glasses (UVA+UVB protection) and wide brim hats (4-inch brim around the entire circumference of the hat) are also recommended for sun protection.  - Call for new or changing lesions.  Family history of skin cancer - what type(s): Unknown - who affected: Mother, grandmother  Skin cancer screening performed today.  Return in about 6 months (around 03/23/2023) for psoriasis follow up .  Luther Redo, CMA, am acting as scribe for Sarina Ser, MD . Documentation: I have reviewed the above documentation for accuracy and completeness, and I agree with the above.  Sarina Ser, MD

## 2022-09-22 NOTE — Patient Instructions (Signed)
Due to recent changes in healthcare laws, you may see results of your pathology and/or laboratory studies on MyChart before the doctors have had a chance to review them. We understand that in some cases there may be results that are confusing or concerning to you. Please understand that not all results are received at the same time and often the doctors may need to interpret multiple results in order to provide you with the best plan of care or course of treatment. Therefore, we ask that you please give us 2 business days to thoroughly review all your results before contacting the office for clarification. Should we see a critical lab result, you will be contacted sooner.   If You Need Anything After Your Visit  If you have any questions or concerns for your doctor, please call our main line at [phone] and press option 4 to reach your doctor's medical assistant. If no one answers, please leave a voicemail as directed and we will return your call as soon as possible. Messages left after 4 pm will be answered the following business day.   You may also send us a message via MyChart. We typically respond to MyChart messages within 1-2 business days.  For prescription refills, please ask your pharmacy to contact our office. Our fax number is [phone].  If you have an urgent issue when the clinic is closed that cannot wait until the next business day, you can page your doctor at the number below.    Please note that while we do our best to be available for urgent issues outside of office hours, we are not available 24/7.   If you have an urgent issue and are unable to reach us, you may choose to seek medical care at your doctor's office, retail clinic, urgent care center, or emergency room.  If you have a medical emergency, please immediately call 911 or go to the emergency department.  Pager Numbers  - Dr. Kowalski: [phone]  - Dr. Moye: [phone]  - Dr. Stewart:  [phone]  In the event of inclement weather, please call our main line at [phone] for an update on the status of any delays or closures.  Dermatology Medication Tips: Please keep the boxes that topical medications come in in order to help keep track of the instructions about where and how to use these. Pharmacies typically print the medication instructions only on the boxes and not directly on the medication tubes.   If your medication is too expensive, please contact our office at [phone] option 4 or send us a message through MyChart.   We are unable to tell what your co-pay for medications will be in advance as this is different depending on your insurance coverage. However, we may be able to find a substitute medication at lower cost or fill out paperwork to get insurance to cover a needed medication.   If a prior authorization is required to get your medication covered by your insurance company, please allow us 1-2 business days to complete this process.  Drug prices often vary depending on where the prescription is filled and some pharmacies may offer cheaper prices.  The website www.goodrx.com contains coupons for medications through different pharmacies. The prices here do not account for what the cost may be with help from insurance (it may be cheaper with your insurance), but the website can give you the price if you did not use any insurance.  - You can print the associated coupon and take it with   your prescription to the pharmacy.  - You may also stop by our office during regular business hours and pick up a GoodRx coupon card.  - If you need your prescription sent electronically to a different pharmacy, notify our office through [facility] MyChart or by phone at [phone] option 4.     Si Usted Necesita Algo Despus de Su Visita  Tambin puede enviarnos un mensaje a travs de MyChart. Por lo general respondemos a los mensajes de MyChart en el transcurso de 1 a 2  das hbiles.  Para renovar recetas, por favor pida a su farmacia que se ponga en contacto con nuestra oficina. Nuestro nmero de fax es el [phone].  Si tiene un asunto urgente cuando la clnica est cerrada y que no puede esperar hasta el siguiente da hbil, puede llamar/localizar a su doctor(a) al nmero que aparece a continuacin.   Por favor, tenga en cuenta que aunque hacemos todo lo posible para estar disponibles para asuntos urgentes fuera del horario de oficina, no estamos disponibles las 24 horas del da, los 7 das de la semana.   Si tiene un problema urgente y no puede comunicarse con nosotros, puede optar por buscar atencin mdica  en el consultorio de su doctor(a), en una clnica privada, en un centro de atencin urgente o en una sala de emergencias.  Si tiene una emergencia mdica, por favor llame inmediatamente al 911 o vaya a la sala de emergencias.  Nmeros de bper  - Dr. Kowalski: [phone]  - Dra. Moye: [phone]  - Dra. Stewart: [phone]  En caso de inclemencias del tiempo, por favor llame a nuestra lnea principal al [phone] para una actualizacin sobre el estado de cualquier retraso o cierre.  Consejos para la medicacin en dermatologa: Por favor, guarde las cajas en las que vienen los medicamentos de uso tpico para ayudarle a seguir las instrucciones sobre dnde y cmo usarlos. Las farmacias generalmente imprimen las instrucciones del medicamento slo en las cajas y no directamente en los tubos del medicamento.   Si su medicamento es muy caro, por favor, pngase en contacto con nuestra oficina llamando al [phone] y presione la opcin 4 o envenos un mensaje a travs de MyChart.   No podemos decirle cul ser su copago por los medicamentos por adelantado ya que esto es diferente dependiendo de la cobertura de su seguro. Sin embargo, es posible que podamos encontrar un medicamento sustituto a menor costo o llenar un formulario para que el  seguro cubra el medicamento que se considera necesario.   Si se requiere una autorizacin previa para que su compaa de seguros cubra su medicamento, por favor permtanos de 1 a 2 das hbiles para completar este proceso.  Los precios de los medicamentos varan con frecuencia dependiendo del lugar de dnde se surte la receta y alguna farmacias pueden ofrecer precios ms baratos.  El sitio web www.goodrx.com tiene cupones para medicamentos de diferentes farmacias. Los precios aqu no tienen en cuenta lo que podra costar con la ayuda del seguro (puede ser ms barato con su seguro), pero el sitio web puede darle el precio si no utiliz ningn seguro.  - Puede imprimir el cupn correspondiente y llevarlo con su receta a la farmacia.  - Tambin puede pasar por nuestra oficina durante el horario de atencin regular y recoger una tarjeta de cupones de GoodRx.  - Si necesita que su receta se enve electrnicamente a una farmacia diferente, informe a nuestra oficina a travs de MyChart de [facility]   o por telfono llamando al [phone] y presione la opcin 4.  

## 2022-09-23 ENCOUNTER — Encounter: Payer: Self-pay | Admitting: Dermatology

## 2022-10-28 ENCOUNTER — Encounter: Payer: Managed Care, Other (non HMO) | Admitting: Family Medicine

## 2022-12-09 ENCOUNTER — Encounter: Payer: Managed Care, Other (non HMO) | Admitting: Family Medicine

## 2022-12-31 ENCOUNTER — Inpatient Hospital Stay: Payer: Managed Care, Other (non HMO) | Attending: Oncology | Admitting: Oncology

## 2022-12-31 ENCOUNTER — Encounter: Payer: Self-pay | Admitting: Oncology

## 2022-12-31 ENCOUNTER — Inpatient Hospital Stay: Payer: Managed Care, Other (non HMO)

## 2022-12-31 VITALS — BP 116/77 | HR 92 | Temp 96.7°F | Wt 159.5 lb

## 2022-12-31 DIAGNOSIS — D509 Iron deficiency anemia, unspecified: Secondary | ICD-10-CM | POA: Diagnosis not present

## 2022-12-31 DIAGNOSIS — D508 Other iron deficiency anemias: Secondary | ICD-10-CM | POA: Diagnosis present

## 2022-12-31 DIAGNOSIS — Z9884 Bariatric surgery status: Secondary | ICD-10-CM | POA: Diagnosis not present

## 2022-12-31 DIAGNOSIS — E538 Deficiency of other specified B group vitamins: Secondary | ICD-10-CM | POA: Diagnosis present

## 2022-12-31 NOTE — Progress Notes (Signed)
Hematology/Oncology Consult note [facility]  [phone] [phone]  Patient Care Team: Alba Cory, MD as PCP - General (Family Medicine)   Name of the patient: Ashlee Cruz  [id number]  03-20-1966   Date of visit: 12/31/22  Diagnosis- iron deficiency anemia possibly secondary to gastric bypass   Chief complaint/ Reason for visit- routine f/u of anemia  Heme/Onc history: Patient is a 57 year old female with a past medical history significant for diabetes, depression who has been referred to [place] for anemia.  Patient was recently seen by Dr. Maximino Greenland for iron deficiency.  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  Recent labs were from June 2021 which showed H&H of 11.6/35.9 with an MCV of 81.  B12 and folate were normal.  Iron studies showed a low ferritin of 5 and low iron saturation of 6% with elevated TIBC of 494.  Patient reports ongoing fatigue.  Denies any blood loss in her stool or urine.  Denies any dark melanotic stools.  Denies any consistent use of NSAIDs.  No significant family history of colon cancer.   Interval history-patient has been taking over-the-counter B12 tablets and has not been on B12 injections for the last 1 month.  Overall she feels well today and denies any complaints at this time  ECOG PS- 0 Pain scale- 0   Review of systems- Review of Systems  Constitutional:  Negative for chills, fever, malaise/fatigue and weight loss.  HENT:  Negative for congestion, ear discharge and nosebleeds.   Eyes:  Negative for blurred vision.  Respiratory:  Negative for cough, hemoptysis, sputum production, shortness of breath and wheezing.   Cardiovascular:  Negative for chest pain, palpitations, orthopnea and claudication.  Gastrointestinal:  Negative for abdominal pain, blood in stool, constipation, diarrhea, heartburn, melena, nausea and vomiting.  Genitourinary:  Negative for dysuria, flank pain,  frequency, hematuria and urgency.  Musculoskeletal:  Negative for back pain, joint pain and myalgias.  Skin:  Negative for rash.  Neurological:  Negative for dizziness, tingling, focal weakness, seizures, weakness and headaches.  Endo/Heme/Allergies:  Does not bruise/bleed easily.  Psychiatric/Behavioral:  Negative for depression and suicidal ideas. The patient does not have insomnia.       Allergies  Allergen Reactions   Other Other (See Comments)    Any fragrant causes psoriasus   Latex     Pt reports prolonged exposure causes aggravation in psoriasis   Codeine Rash     Past Medical History:  Diagnosis Date   Allergic rhinitis    Anemia    Depression    Family history of adverse reaction to anesthesia    Mother - PONV   Hyperlipidemia    Hypertension    Metabolic syndrome    Obesity    PONV (postoperative nausea and vomiting)    after gallbladder surgery   Psoriasis    Sciatica of left side    Vertigo    1 episode over 1 yr ago     Past Surgical History:  Procedure Laterality Date   BARIATRIC SURGERY     CHOLECYSTECTOMY     COLONOSCOPY WITH PROPOFOL N/A 05/07/2020   Procedure: COLONOSCOPY WITH PROPOFOL;  Surgeon: Pasty Spillers, MD;  Location: [facility];  Service: Endoscopy;  Laterality: N/A;   DILATION AND CURETTAGE OF UTERUS     ESOPHAGOGASTRODUODENOSCOPY (EGD) WITH PROPOFOL N/A 05/07/2020   Procedure: ESOPHAGOGASTRODUODENOSCOPY (EGD) WITH BIOPSY;  Surgeon: Pasty Spillers, MD;  Location:  [facility];  Service: Endoscopy;  Laterality: N/A;   LUMBAR SPINE SURGERY Left 02/2016   Pt reports lumbar surgery without placement of instrumentation to decompress nerve on left side.     Social History   Socioeconomic History   Marital status: Married    Spouse name: Caryn Bee   Number of children: 1   Years of education: Not on file   Highest education level: Associate degree: occupational, Scientist, product/process development, or vocational program  Occupational  History   Occupation: education/training   Tobacco Use   Smoking status: Never   Smokeless tobacco: Never  Vaping Use   Vaping Use: Never used  Substance and Sexual Activity   Alcohol use: No    Alcohol/week: 0.0 standard drinks of alcohol   Drug use: No   Sexual activity: Yes    Partners: Male    Birth control/protection: None  Other Topics Concern   Not on file  Social History Narrative   Not on file   Social Determinants of Health   Financial Resource Strain: Low Risk  (10/24/2021)   Overall Financial Resource Strain (CARDIA)    Difficulty of Paying Living Expenses: Not hard at all  Food Insecurity: No Food Insecurity (10/24/2021)   Hunger Vital Sign    Worried About Running Out of Food in the Last Year: Never true    Ran Out of Food in the Last Year: Never true  Transportation Needs: No Transportation Needs (10/24/2021)   PRAPARE - Administrator, Civil Service (Medical): No    Lack of Transportation (Non-Medical): No  Physical Activity: Insufficiently Active (10/24/2021)   Exercise Vital Sign    Days of Exercise per Week: 2 days    Minutes of Exercise per Session: 20 min  Stress: Stress Concern Present (10/24/2021)   [employer] of Occupational Health - Occupational Stress Questionnaire    Feeling of Stress : Rather much  Social Connections: Moderately Integrated (10/24/2021)   Social Connection and Isolation Panel [NHANES]    Frequency of Communication with Friends and Family: More than three times a week    Frequency of Social Gatherings with Friends and Family: Twice a week    Attends Religious Services: More than 4 times per year    Active Member of [employer] or Organizations: No    Attends Banker Meetings: Never    Marital Status: Married  Catering manager Violence: Not At Risk (10/24/2021)   Humiliation, Afraid, Rape, and Kick questionnaire    Fear of Current or Ex-Partner: No    Emotionally Abused: No    Physically Abused: No    Sexually  Abused: No    Family History  Problem Relation Age of Onset   Diabetes Mother    Skin cancer Mother    Diabetes Father    COPD Father    Seizures Father    Heart disease Father 48       CABG   Diabetes Brother    Multiple sclerosis Brother    Stroke Paternal Grandmother      Current Outpatient Medications:    acetaminophen (TYLENOL) 500 MG tablet, Take 1,000 mg by mouth as needed for moderate pain., Disp: , Rfl:    Apremilast (OTEZLA) 30 MG TABS, Take 1 tablet (30 mg total) by mouth 2 (two) times daily., Disp: 60 tablet, Rfl: 6   cholecalciferol (VITAMIN D3) 25 MCG (1000 UT) tablet, Take 2,000 Units by mouth daily., Disp: , Rfl:    cyanocobalamin (,VITAMIN B-12,) 1000 MCG/ML injection,  Inject 1 mL (1,000 mcg total) into the muscle once a week for 30 days, THEN 1 mL (1,000 mcg total) every 30 (thirty) days., Disp: 1 mL, Rfl: 11   DULoxetine (CYMBALTA) 60 MG capsule, Take 1 capsule (60 mg total) by mouth daily., Disp: 90 capsule, Rfl: 1   metFORMIN (GLUCOPHAGE-XR) 500 MG 24 hr tablet, Take 1 tablet (500 mg total) by mouth daily with breakfast., Disp: 90 tablet, Rfl: 1   methocarbamol (ROBAXIN) 500 MG tablet, TAKE 1 TABLET(500 MG) BY MOUTH TWICE DAILY AS NEEDED FOR MUSCLE SPASMS, Disp: 180 tablet, Rfl: 1   Syringe/Needle, Disp, (SYRINGE 3CC/25GX5/8") 25G X 5/8" 3 ML MISC, 3 mLs by Does not apply route every 30 (thirty) days., Disp: 11 each, Rfl: 0   Tapinarof (VTAMA) 1 % CREA, Apply 1 application topically daily. Apply to affected pink areas of hands and body for psoriasis, Disp: 60 g, Rfl: 6   diphenhydrAMINE HCl (BENADRYL ALLERGY PO), Take by mouth. PRN (Patient not taking: Reported on 12/31/2022), Disp: , Rfl:   Physical exam:  Vitals:   12/31/22 1342  BP: 116/77  Pulse: 92  Temp: (!) 96.7 F (35.9 C)  TempSrc: Tympanic  SpO2: 99%  Weight: 159 lb 8 oz (72.3 kg)   Physical Exam Cardiovascular:     Rate and Rhythm: Normal rate and regular rhythm.     Heart sounds: Normal  heart sounds.  Pulmonary:     Effort: Pulmonary effort is normal.  Skin:    General: Skin is warm and dry.  Neurological:     Mental Status: She is alert and oriented to person, place, and time.         Latest Ref Rng & Units 09/19/2022    8:17 AM  CMP  Glucose 70 - 99 mg/dL 87   BUN 6 - 24 mg/dL 13   Creatinine 7.82 - 1.00 mg/dL 9.56   Sodium 213 - 086 mmol/L 143   Potassium 3.5 - 5.2 mmol/L 4.5   Chloride 96 - 106 mmol/L 102   CO2 20 - 29 mmol/L 25   Calcium 8.7 - 10.2 mg/dL 9.7   Total Protein 6.0 - 8.5 g/dL 6.9   Total Bilirubin 0.0 - 1.2 mg/dL 0.9   Alkaline Phos 44 - 121 IU/L 100   AST 0 - 40 IU/L 25   ALT 0 - 32 IU/L 21       Latest Ref Rng & Units 09/19/2022    8:17 AM  CBC  WBC 3.4 - 10.8 x10E3/uL 4.5   Hemoglobin 11.1 - 15.9 g/dL 57.8   Hematocrit 46.9 - 46.6 % 45.6   Platelets 150 - 450 x10E3/uL 346      Assessment and plan- Patient is a 57 y.o. female here for routine follow-up of iron and B12 deficiency anemia secondary to gastric bypass  Patient has not had any anemia labs drawn since February 2024.  Based on labs from FEB, her hemoglobin iron studies and B12 are normal.  We will plan to check CBC ferritin and iron studies as well as B12 levels now as well as in 3 months 6 months and 12 months and I will see her back in 12 months.  Labs will be done at [facility].  If B12 levels remain greater than 300 on oral supplements she does not have to go back to B12 injections  Visit Diagnosis 1. B12 deficiency   2. H/O gastric bypass   3. Iron deficiency anemia, unspecified iron deficiency anemia type  Dr. Owens Shark, MD, MPH [facility] [phone] 12/31/2022 2:46 PM

## 2022-12-31 NOTE — Progress Notes (Signed)
Pt would like to know if she is able to keep taking the pill form of the vitamin B 12?

## 2023-01-26 ENCOUNTER — Encounter: Payer: Self-pay | Admitting: Oncology

## 2023-02-18 NOTE — Progress Notes (Signed)
Name: Ashlee Cruz   MRN: [id number]    DOB: Dec 26, 1965   Date:02/23/2023       Progress Note  Subjective  Chief Complaint  Follow Up  HPI  Pre-diabetes: last A1C improved down to 5.5 % . She has been more active lately, she denies polyphagia, polydipsia or polyphagia. 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 Lisbeth Renshaw  in July 2017 and was doing great, but a few months after the surgery she had a flare. She was seen by Dr. Conchita Paris and had steroid injection end of November 2017 and symptoms improved a little, she had another injection January 2018 She continues to have neck and back pain.  Dyslipidemia: LDL was 137   The 10-year ASCVD risk score (Arnett DK, et al., 2019) is: 1.1%   Values used to calculate the score:     Age: 57 years     Sex: Female     Is Non-Hispanic African American: No     Diabetic: No     Tobacco smoker: No     Systolic Blood Pressure: 112 mmHg     Is BP treated: No     HDL Cholesterol: 99 mg/dL     Total Cholesterol: 248 mg/dL   Toothache : 6 months ago she started to notice  pain on left upper tooth that was  radiating to left ear, sometimes radiating to  left side of nose . She has an old crown on the area of the pain, we advised her to see dentist, she had a root canal however to feel congested of left side, intermittent pain on left sinus and also left ear fullness, we placed a referral to ENT but she did not go yet. She will contact them    Major Depression Remission: she is now on Cymbalta to help with pain and mood.  She still  worries about her mother that lives in [place] and is getting sicker and would like for her to move in with her.   Palpitation: she has intermittent symptoms, occasionally gets dizzy when she gets up quickly, but no chest pain . Unchanged  B12 and vitamin D deficiency: very low B12 at 167 - checked by hematologist, she is now off injections, taking oral supplementation and last level was high. Taking vitamin D supplementation and last level at goal    Eczema: both hands , well controlled with medication, sees Dr. Gwen Pounds .  Stable   Patient Active Problem List   Diagnosis Date Noted   Iron deficiency anemia 05/02/2020   Tachycardia 05/19/2017   Palpitations 05/19/2017   Fibroid uterus 12/20/2015   History of iron deficiency anemia 10/23/2015   Left lumbar radiculitis 10/23/2015   Absence of menstruation 04/26/2015   B12 deficiency 04/26/2015   Depression, major, recurrent, mild (HCC) 04/26/2015   Dyslipidemia 04/26/2015   Dysmetabolic syndrome 04/26/2015   Bariatric surgery status 04/26/2015   H/O: HTN (hypertension) 04/26/2015   Psoriasis 04/26/2015   Allergic rhinitis, seasonal 04/26/2015  Vitamin D deficiency 04/26/2015   Bilateral knee pain 04/26/2015    Past Surgical History:  Procedure Laterality Date   BARIATRIC SURGERY     CHOLECYSTECTOMY     COLONOSCOPY WITH PROPOFOL N/A 05/07/2020   Procedure: COLONOSCOPY WITH PROPOFOL;  Surgeon: Pasty Spillers, MD;  Location: [facility];  Service: Endoscopy;  Laterality: N/A;   DILATION AND CURETTAGE OF UTERUS     ESOPHAGOGASTRODUODENOSCOPY (EGD) WITH PROPOFOL N/A 05/07/2020   Procedure: ESOPHAGOGASTRODUODENOSCOPY (EGD) WITH BIOPSY;  Surgeon: Pasty Spillers, MD;  Location: [facility];  Service: Endoscopy;  Laterality: N/A;   LUMBAR SPINE SURGERY Left 02/2016   Pt reports lumbar surgery without placement of instrumentation to decompress nerve on left side.     Family History  Problem Relation Age of Onset    Diabetes Mother    Skin cancer Mother    Diabetes Father    COPD Father    Seizures Father    Heart disease Father 64       CABG   Diabetes Brother    Multiple sclerosis Brother    Stroke Paternal Grandmother     Social History   Tobacco Use   Smoking status: Never   Smokeless tobacco: Never  Substance Use Topics   Alcohol use: No    Alcohol/week: 0.0 standard drinks of alcohol     Current Outpatient Medications:    acetaminophen (TYLENOL) 500 MG tablet, Take 1,000 mg by mouth as needed for moderate pain., Disp: , Rfl:    Apremilast (OTEZLA) 30 MG TABS, Take 1 tablet (30 mg total) by mouth 2 (two) times daily., Disp: 60 tablet, Rfl: 6   cholecalciferol (VITAMIN D3) 25 MCG (1000 UT) tablet, Take 2,000 Units by mouth daily., Disp: , Rfl:    DULoxetine (CYMBALTA) 60 MG capsule, Take 1 capsule (60 mg total) by mouth daily., Disp: 90 capsule, Rfl: 1   metFORMIN (GLUCOPHAGE-XR) 500 MG 24 hr tablet, Take 1 tablet (500 mg total) by mouth daily with breakfast., Disp: 90 tablet, Rfl: 1   methocarbamol (ROBAXIN) 500 MG tablet, TAKE 1 TABLET(500 MG) BY MOUTH TWICE DAILY AS NEEDED FOR MUSCLE SPASMS, Disp: 180 tablet, Rfl: 1   Syringe/Needle, Disp, (SYRINGE 3CC/25GX5/8") 25G X 5/8" 3 ML MISC, 3 mLs by Does not apply route every 30 (thirty) days., Disp: 11 each, Rfl: 0   Tapinarof (VTAMA) 1 % CREA, Apply 1 application topically daily. Apply to affected pink areas of hands and body for psoriasis, Disp: 60 g, Rfl: 6  Allergies  Allergen Reactions   Other Other (See Comments)    Any fragrant causes psoriasus   Latex     Pt reports prolonged exposure causes aggravation in psoriasis   Codeine Rash    I personally reviewed active problem list, medication list, allergies, family history, social history, health maintenance with the patient/caregiver today.   ROS  Ten systems reviewed and is negative except as mentioned in HPI   Objective  Vitals:   02/23/23 0802  BP: 112/62  Pulse:  95  Resp: 16  Temp: 98 F (36.7 C)  TempSrc: Oral  SpO2: 97%  Weight: 159 lb 11.2 oz (72.4 kg)  Height: 5' (1.524 m)    Body mass index is 31.19 kg/m.  Physical Exam  Constitutional: Patient appears well-developed and well-nourished. Obese  No distress.  HEENT: head atraumatic, normocephalic, pupils equal and reactive to light, neck supple Cardiovascular: Normal rate, regular rhythm and normal heart sounds.  No murmur heard. No BLE  edema. Pulmonary/Chest: Effort normal and breath sounds normal. No respiratory distress. Abdominal: Soft.  There is no tenderness. Skin: eczema on hands  Psychiatric: Patient has a normal mood and affect. behavior is normal. Judgment and thought content normal.   PHQ2/9:    02/23/2023    8:05 AM 08/25/2022    7:58 AM 07/31/2022    9:48 AM 02/20/2022    9:41 AM 10/24/2021    9:18 AM  Depression screen PHQ 2/9  Decreased Interest 0 0 0 0 0  Down, Depressed, Hopeless 0 0 0 0 1  PHQ - 2 Score 0 0 0 0 1  Altered sleeping 1 0 0 0 2  Tired, decreased energy 0 0 0 0 0  Change in appetite 0 0 0 0 0  Feeling bad or failure about yourself  0 0 0 0 0  Trouble concentrating 1 0 0 0 0  Moving slowly or fidgety/restless 0 0 0 0 0  Suicidal thoughts 0 0 0 0 0  PHQ-9 Score 2 0 0 0 3  Difficult doing work/chores Not difficult at all  Not difficult at all      phq 9 is negative   Fall Risk:    02/23/2023    8:04 AM 08/25/2022    7:58 AM 07/31/2022    9:48 AM 02/20/2022    9:41 AM 10/24/2021    9:17 AM  Fall Risk   Falls in the past year? 0 0 0 0 0  Number falls in past yr: 0 0 0 0 0  Injury with Fall? 0 0 0 0 0  Risk for fall due to :  No Fall Risks No Fall Risks No Fall Risks No Fall Risks  Follow up  Falls prevention discussed Falls prevention discussed;Education provided;Falls evaluation completed Falls prevention discussed Falls prevention discussed      Functional Status Survey: Is the patient deaf or have difficulty hearing?: No Does the patient have  difficulty seeing, even when wearing glasses/contacts?: No Does the patient have difficulty concentrating, remembering, or making decisions?: No Does the patient have difficulty walking or climbing stairs?: No Does the patient have difficulty dressing or bathing?: No Does the patient have difficulty doing errands alone such as visiting a doctor's office or shopping?: No    Assessment & Plan  1. Dysmetabolic syndrome  - metFORMIN (GLUCOPHAGE-XR) 500 MG 24 hr tablet; Take 1 tablet (500 mg total) by mouth daily with breakfast.  Dispense: 90 tablet; Refill: 1  2. Major depression in remission (HCC)  - DULoxetine (CYMBALTA) 60 MG capsule; Take 1 capsule (60 mg total) by mouth daily.  Dispense: 90 capsule; Refill: 1  3. B12 deficiency  She stopped injections, seeing Dr. Smith Robert +  4. Dyslipidemia  We will continue to monitor   5. Vitamin D deficiency  Continue vitamin D supplementation  6. Bariatric surgery status  - metFORMIN (GLUCOPHAGE-XR) 500 MG 24 hr tablet; Take 1 tablet (500 mg total) by mouth daily with breakfast.  Dispense: 90 tablet; Refill: 1  7. Chronic midline low back pain with left-sided sciatica  - methocarbamol (ROBAXIN) 500 MG tablet; TAKE 1 TABLET(500 MG) BY MOUTH TWICE DAILY AS NEEDED FOR MUSCLE SPASMS  Dispense: 180 tablet; Refill: 1 - DULoxetine (CYMBALTA) 60 MG capsule; Take 1 capsule (60 mg total) by mouth daily.  Dispense: 90 capsule; Refill: 1

## 2023-02-23 ENCOUNTER — Encounter: Payer: Self-pay | Admitting: Family Medicine

## 2023-02-23 ENCOUNTER — Ambulatory Visit: Payer: Managed Care, Other (non HMO) | Admitting: Family Medicine

## 2023-02-23 VITALS — BP 112/62 | HR 95 | Temp 98.0°F | Resp 16 | Ht 60.0 in | Wt 159.7 lb

## 2023-02-23 DIAGNOSIS — Z9884 Bariatric surgery status: Secondary | ICD-10-CM

## 2023-02-23 DIAGNOSIS — E559 Vitamin D deficiency, unspecified: Secondary | ICD-10-CM

## 2023-02-23 DIAGNOSIS — E785 Hyperlipidemia, unspecified: Secondary | ICD-10-CM

## 2023-02-23 DIAGNOSIS — E8881 Metabolic syndrome: Secondary | ICD-10-CM

## 2023-02-23 DIAGNOSIS — E538 Deficiency of other specified B group vitamins: Secondary | ICD-10-CM

## 2023-02-23 DIAGNOSIS — F325 Major depressive disorder, single episode, in full remission: Secondary | ICD-10-CM | POA: Diagnosis not present

## 2023-02-23 DIAGNOSIS — G8929 Other chronic pain: Secondary | ICD-10-CM

## 2023-02-23 DIAGNOSIS — M5442 Lumbago with sciatica, left side: Secondary | ICD-10-CM

## 2023-02-23 MED ORDER — METFORMIN HCL ER 500 MG PO TB24: 500.0000 mg | ORAL_TABLET | Freq: Every day | ORAL | 1 refills | Status: DC

## 2023-02-23 MED ORDER — DULOXETINE HCL 60 MG PO CPEP: 60.0000 mg | ORAL_CAPSULE | Freq: Every day | ORAL | 1 refills | Status: DC

## 2023-02-23 MED ORDER — METHOCARBAMOL 500 MG PO TABS: ORAL_TABLET | ORAL | 1 refills | Status: DC

## 2023-02-26 ENCOUNTER — Encounter: Payer: Managed Care, Other (non HMO) | Admitting: Family Medicine

## 2023-04-14 ENCOUNTER — Encounter: Payer: Self-pay | Admitting: Oncology

## 2023-04-24 NOTE — Progress Notes (Deleted)
Name: Ashlee Cruz   MRN: [id number]    DOB: [birth year]   Date:04/24/2023       Progress Note  Subjective  Chief Complaint  Annual Exam  HPI  Patient presents for annual CPE.  Diet: *** Exercise: ***  Last Eye Exam: *** Last Dental Exam: ***  Flowsheet Row Office Visit from 10/24/2021 in [facility]  AUDIT-C Score 0      Depression: Phq 9 is  {Desc; negative/positive:13464}    02/23/2023    8:05 AM 08/25/2022    7:58 AM 07/31/2022    9:48 AM 02/20/2022    9:41 AM 10/24/2021    9:18 AM  Depression screen PHQ 2/9  Decreased Interest 0 0 0 0 0  Down, Depressed, Hopeless 0 0 0 0 1  PHQ - 2 Score 0 0 0 0 1  Altered sleeping 1 0 0 0 2  Tired, decreased energy 0 0 0 0 0  Change in appetite 0 0 0 0 0  Feeling bad or failure about yourself  0 0 0 0 0  Trouble concentrating 1 0 0 0 0  Moving slowly or fidgety/restless 0 0 0 0 0  Suicidal thoughts 0 0 0 0 0  PHQ-9 Score 2 0 0 0 3  Difficult doing work/chores Not difficult at all  Not difficult at all     Hypertension: BP Readings from Last 3 Encounters:  02/23/23 112/62  12/31/22 116/77  09/22/22 110/78   Obesity: Wt Readings from Last 3 Encounters:  02/23/23 159 lb 11.2 oz (72.4 kg)  12/31/22 159 lb 8 oz (72.3 kg)  08/25/22 161 lb (73 kg)   BMI Readings from Last 3 Encounters:  02/23/23 31.19 kg/m  12/31/22 31.15 kg/m  08/25/22 31.44 kg/m     Vaccines:   HPV: N/A Tdap: up to date Shingrix: up to date Pneumonia: N/A Flu: 2022, due COVID-19: up to date   Hep C Screening: 02/08/20 STD testing and prevention (HIV/chl/gon/syphilis): N/A Intimate partner violence: negative screen  Sexual History : Menstrual History/LMP/Abnormal Bleeding:  Discussed importance of follow up if any post-menopausal bleeding: yes  Incontinence Symptoms: negative for symptoms   Breast cancer:  - Last Mammogram: 10/29/21 - BRCA gene screening: N/A  Osteoporosis Prevention : Discussed high calcium and  vitamin D supplementation, weight bearing exercises Bone density: N/A   Cervical cancer screening: 01/31/19  Skin cancer: Discussed monitoring for atypical lesions  Colorectal cancer: 05/07/20   Lung cancer:  Low Dose CT Chest recommended if Age 26-80 years, 20 pack-year currently smoking OR have quit w/in 15years. Patient does not qualify for screen   ECG: 09/16/17  Advanced Care Planning: A voluntary discussion about advance care planning including the explanation and discussion of advance directives.  Discussed health care proxy and Living will, and the patient was able to identify a health care proxy as ***.  Patient does not have a living will and power of attorney of health care   Lipids: Lab Results  Component Value Date   CHOL 248 (H) 09/19/2022   CHOL 245 (H) 10/23/2021   CHOL 256 (H) 12/24/2020   Lab Results  Component Value Date   HDL 99 09/19/2022   HDL 102 10/23/2021   HDL 93 12/24/2020   Lab Results  Component Value Date   LDLCALC 137 (H) 09/19/2022   LDLCALC 127 (H) 10/23/2021   LDLCALC 146 (H) 12/24/2020   Lab Results  Component Value Date   TRIG 70 09/19/2022  TRIG 93 10/23/2021   TRIG 100 12/24/2020   Lab Results  Component Value Date   CHOLHDL 2.5 09/19/2022   CHOLHDL 2.4 10/23/2021   CHOLHDL 2.8 12/24/2020   No results found for: "LDLDIRECT"  Glucose: Glucose  Date Value Ref Range Status  09/19/2022 87 70 - 99 mg/dL Final  40/98/1191 89 70 - 99 mg/dL Final  47/82/9562 82 65 - 99 mg/dL Final  13/03/6577 469 (H) 65 - 99 mg/dL Final   Glucose-Capillary  Date Value Ref Range Status  05/07/2020 92 70 - 99 mg/dL Final    Comment:    Glucose reference range applies only to samples taken after fasting for at least 8 hours.  05/07/2020 109 (H) 70 - 99 mg/dL Final    Comment:    Glucose reference range applies only to samples taken after fasting for at least 8 hours.    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   Dyslipidemia 04/26/2015   Dysmetabolic syndrome 04/26/2015   Bariatric surgery status 04/26/2015   H/O: HTN (hypertension) 04/26/2015   Psoriasis 04/26/2015   Allergic rhinitis, seasonal 04/26/2015   Vitamin D deficiency 04/26/2015   Bilateral knee pain 04/26/2015    Past Surgical History:  Procedure Laterality Date   BARIATRIC SURGERY     CHOLECYSTECTOMY     COLONOSCOPY WITH PROPOFOL N/A 05/07/2020   Procedure: COLONOSCOPY WITH PROPOFOL;  Surgeon: Pasty Spillers, MD;  Location: [facility];  Service: Endoscopy;  Laterality: N/A;   DILATION AND CURETTAGE OF UTERUS     ESOPHAGOGASTRODUODENOSCOPY (EGD) WITH PROPOFOL N/A 05/07/2020   Procedure: ESOPHAGOGASTRODUODENOSCOPY (EGD) WITH BIOPSY;  Surgeon: Pasty Spillers, MD;  Location: [facility];  Service: Endoscopy;  Laterality: N/A;   LUMBAR SPINE SURGERY Left 02/2016   Pt reports lumbar surgery without placement of instrumentation to decompress nerve on left side.     Family History  Problem Relation Age of Onset   Diabetes Mother    Skin cancer Mother    Diabetes Father    COPD Father    Seizures Father    Heart disease Father 82       CABG   Diabetes Brother    Multiple sclerosis Brother    Stroke Paternal Grandmother     Social History   Socioeconomic History   Marital status: Married    Spouse name: Caryn Bee   Number of children: 1   Years of education: Not on file   Highest education level: Associate degree: occupational, Scientist, product/process development, or vocational program  Occupational History   Occupation: Photographer   Tobacco Use   Smoking status: Never   Smokeless tobacco: Never  Vaping Use   Vaping  status: Never Used  Substance and Sexual Activity   Alcohol use: No    Alcohol/week: 0.0 standard drinks of alcohol   Drug use: No   Sexual activity: Yes    Partners: Male    Birth control/protection: None  Other Topics Concern   Not on file  Social History Narrative   Not on file   Social Determinants of Health   Financial Resource Strain: Low Risk  (10/24/2021)   Overall Financial Resource Strain (CARDIA)  Difficulty of Paying Living Expenses: Not hard at all  Food Insecurity: No Food Insecurity (10/24/2021)   Hunger Vital Sign    Worried About Running Out of Food in the Last Year: Never true    Ran Out of Food in the Last Year: Never true  Transportation Needs: No Transportation Needs (10/24/2021)   PRAPARE - Administrator, Civil Service (Medical): No    Lack of Transportation (Non-Medical): No  Physical Activity: Insufficiently Active (10/24/2021)   Exercise Vital Sign    Days of Exercise per Week: 2 days    Minutes of Exercise per Session: 20 min  Stress: Stress Concern Present (10/24/2021)   [employer] of Occupational Health - Occupational Stress Questionnaire    Feeling of Stress : Rather much  Social Connections: Moderately Integrated (10/24/2021)   Social Connection and Isolation Panel [NHANES]    Frequency of Communication with Friends and Family: More than three times a week    Frequency of Social Gatherings with Friends and Family: Twice a week    Attends Religious Services: More than 4 times per year    Active Member of [employer] or Organizations: No    Attends Banker Meetings: Never    Marital Status: Married  Catering manager Violence: Not At Risk (10/24/2021)   Humiliation, Afraid, Rape, and Kick questionnaire    Fear of Current or Ex-Partner: No    Emotionally Abused: No    Physically Abused: No    Sexually Abused: No     Current Outpatient Medications:    acetaminophen (TYLENOL) 500 MG tablet, Take 1,000 mg by mouth as needed for  moderate pain., Disp: , Rfl:    Apremilast (OTEZLA) 30 MG TABS, Take 1 tablet (30 mg total) by mouth 2 (two) times daily., Disp: 60 tablet, Rfl: 6   cholecalciferol (VITAMIN D3) 25 MCG (1000 UT) tablet, Take 2,000 Units by mouth daily., Disp: , Rfl:    DULoxetine (CYMBALTA) 60 MG capsule, Take 1 capsule (60 mg total) by mouth daily., Disp: 90 capsule, Rfl: 1   metFORMIN (GLUCOPHAGE-XR) 500 MG 24 hr tablet, Take 1 tablet (500 mg total) by mouth daily with breakfast., Disp: 90 tablet, Rfl: 1   methocarbamol (ROBAXIN) 500 MG tablet, TAKE 1 TABLET(500 MG) BY MOUTH TWICE DAILY AS NEEDED FOR MUSCLE SPASMS, Disp: 180 tablet, Rfl: 1   Syringe/Needle, Disp, (SYRINGE 3CC/25GX5/8") 25G X 5/8" 3 ML MISC, 3 mLs by Does not apply route every 30 (thirty) days., Disp: 11 each, Rfl: 0   Tapinarof (VTAMA) 1 % CREA, Apply 1 application topically daily. Apply to affected pink areas of hands and body for psoriasis, Disp: 60 g, Rfl: 6  Allergies  Allergen Reactions   Other Other (See Comments)    Any fragrant causes psoriasus   Latex     Pt reports prolonged exposure causes aggravation in psoriasis   Codeine Rash     ROS  ***  Objective  There were no vitals filed for this visit.  There is no height or weight on file to calculate BMI.  Physical Exam ***  No results found for this or any previous visit (from the past 2160 hour(s)).   Fall Risk:    02/23/2023    8:04 AM 08/25/2022    7:58 AM 07/31/2022    9:48 AM 02/20/2022    9:41 AM 10/24/2021    9:17 AM  Fall Risk   Falls in the past year? 0 0 0 0 0  Number falls  in past yr: 0 0 0 0 0  Injury with Fall? 0 0 0 0 0  Risk for fall due to :  No Fall Risks No Fall Risks No Fall Risks No Fall Risks  Follow up  Falls prevention discussed Falls prevention discussed;Education provided;Falls evaluation completed Falls prevention discussed Falls prevention discussed     Functional Status Survey:     Assessment & Plan  1. Well adult  exam ***   -USPSTF grade A and B recommendations reviewed with patient; age-appropriate recommendations, preventive care, screening tests, etc discussed and encouraged; healthy living encouraged; see AVS for patient education given to patient -Discussed importance of 150 minutes of physical activity weekly, eat two servings of fish weekly, eat one serving of tree nuts ( cashews, pistachios, pecans, almonds.Marland Kitchen) every other day, eat 6 servings of fruit/vegetables daily and drink plenty of water and avoid sweet beverages.   -Reviewed Health Maintenance: Yes.

## 2023-04-27 ENCOUNTER — Encounter: Payer: Managed Care, Other (non HMO) | Admitting: Family Medicine

## 2023-04-30 ENCOUNTER — Ambulatory Visit: Payer: Managed Care, Other (non HMO) | Admitting: Dermatology

## 2023-04-30 ENCOUNTER — Encounter: Payer: Self-pay | Admitting: Dermatology

## 2023-04-30 VITALS — BP 122/83 | HR 77

## 2023-04-30 DIAGNOSIS — L409 Psoriasis, unspecified: Secondary | ICD-10-CM | POA: Diagnosis not present

## 2023-04-30 DIAGNOSIS — Z79899 Other long term (current) drug therapy: Secondary | ICD-10-CM

## 2023-04-30 DIAGNOSIS — Z7189 Other specified counseling: Secondary | ICD-10-CM

## 2023-04-30 MED ORDER — OTEZLA 30 MG PO TABS: 1.0000 | ORAL_TABLET | Freq: Two times a day (BID) | ORAL | 6 refills | Status: DC

## 2023-04-30 NOTE — Progress Notes (Signed)
   Follow-Up Visit   Subjective  Ashlee Cruz is a 57 y.o. female who presents for the following: Psoriasis. Palms. Taking Otezla 30 mg 2 pills daily. Tolerating well, denies adverse reactions. Sometimes has a mild headache. Denies GI upset, weight loss, depression.  Patient reports psoriasis is doing well. Has small area of scale on left palm, controlled with moisturizer.     The following portions of the chart were reviewed this encounter and updated as appropriate: medications, allergies, medical history  Review of Systems:  No other skin or systemic complaints except as noted in HPI or Assessment and Plan.  Objective  Well appearing patient in no apparent distress; mood and affect are within normal limits.  Areas Examined: Palms, hands, feet  Relevant exam findings are noted in the Assessment and Plan.      Assessment & Plan   Psoriasis  Related Medications Tapinarof (VTAMA) 1 % CREA Apply 1 application topically daily. Apply to affected pink areas of hands and body for psoriasis  Apremilast (OTEZLA) 30 MG TABS Take 1 tablet (30 mg total) by mouth 2 (two) times daily.    PSORIASIS Erythematous plaque with scale at left medial palm. 4% BSA.  Chronic condition with duration or expected duration over one year. Currently well-controlled.   Patient denies joint pain  Treatment Plan: Continue Otezla 30 mg by mouth twice daily. Side effects of Otezla (apremilast) include diarrhea, nausea, headache, upper respiratory infection, depression, and weight decrease (5-10%). It should only be taken by pregnant women after a discussion regarding risks and benefits with their doctor. Goal is control of skin condition, not cure.  The use of [place] requires long term medication management, including periodic office visits.   Continue Epionce medical barrier cream daily.  Counseling on psoriasis and coordination of care  psoriasis is a chronic non-curable, but treatable  genetic/hereditary disease that may have other systemic features affecting other organ systems such as joints (Psoriatic Arthritis). It is associated with an increased risk of inflammatory bowel disease, heart disease, non-alcoholic fatty liver disease, and depression.  Treatments include light and laser treatments; topical medications; and systemic medications including oral and injectables.  Long term medication management.  Patient is using long term (months to years) prescription medication  to control their dermatologic condition.  These medications require periodic monitoring to evaluate for efficacy and side effects and may require periodic laboratory monitoring.    Return in about 6 months (around 10/28/2023) for TBSE, Psoriasis Follow Up.  I, Lawson Radar, CMA, am acting as scribe for Armida Sans, MD.   Documentation: I have reviewed the above documentation for accuracy and completeness, and I agree with the above.  Armida Sans, MD

## 2023-04-30 NOTE — Patient Instructions (Signed)
Continue Otezla 30 mg by mouth twice daily. Side effects of Otezla (apremilast) include diarrhea, nausea, headache, upper respiratory infection, depression, and weight decrease (5-10%). It should only be taken by pregnant women after a discussion regarding risks and benefits with their doctor. Goal is control of skin condition, not cure.  The use of Henderson [place] requires long term medication management, including periodic office visits.   Continue Epionce medical barrier cream daily.    Due to recent changes in healthcare laws, you may see results of your pathology and/or laboratory studies on MyChart before the doctors have had a chance to review them. We understand that in some cases there may be results that are confusing or concerning to you. Please understand that not all results are received at the same time and often the doctors may need to interpret multiple results in order to provide you with the best plan of care or course of treatment. Therefore, we ask that you please give [place] 2 business days to thoroughly review all your results before contacting the office for clarification. Should we see a critical lab result, you will be contacted sooner.   If You Need Anything After Your Visit  If you have any questions or concerns for your doctor, please call our main line at [phone] and press option 4 to reach your doctor's medical assistant. If no one answers, please leave a voicemail as directed and we will return your call as soon as possible. Messages left after 4 pm will be answered the following business day.   You may also send [place] a message via MyChart. We typically respond to MyChart messages within 1-2 business days.  For prescription refills, please ask your pharmacy to contact our office. Our fax number is [phone].  If you have an urgent issue when the clinic is closed that cannot wait until the next business day, you can page your doctor at the number below.    Please note that  while we do our best to be available for urgent issues outside of office hours, we are not available 24/7.   If you have an urgent issue and are unable to reach [place], you may choose to seek medical care at your doctor's office, retail clinic, urgent care center, or emergency room.  If you have a medical emergency, please immediately call 911 or go to the emergency department.  Pager Numbers  - Dr. Gwen Pounds: [phone]  - Dr. Roseanne Reno: [phone]  - Dr. Katrinka Blazing: [phone]   In the event of inclement weather, please call our main line at [phone] for an update on the status of any delays or closures.  Dermatology Medication Tips: Please keep the boxes that topical medications come in in order to help keep track of the instructions about where and how to use these. Pharmacies typically print the medication instructions only on the boxes and not directly on the medication tubes.   If your medication is too expensive, please contact our office at [phone] option 4 or send [place] a message through MyChart.   We are unable to tell what your co-pay for medications will be in advance as this is different depending on your insurance coverage. However, we may be able to find a substitute medication at lower cost or fill out paperwork to get insurance to cover a needed medication.   If a prior authorization is required to get your medication covered by your insurance company, please allow [place] 1-2 business days to complete this process.  Drug prices  often vary depending on where the prescription is filled and some pharmacies may offer cheaper prices.  The website www.goodrx.com contains coupons for medications through different pharmacies. The prices here do not account for what the cost may be with help from insurance (it may be cheaper with your insurance), but the website can give you the price if you did not use any insurance.  - You can print the associated coupon and take it with your  prescription to the pharmacy.  - You may also stop by our office during regular business hours and pick up a GoodRx coupon card.  - If you need your prescription sent electronically to a different pharmacy, notify our office through [facility] or by phone at [phone] option 4.     Si Usted Necesita Algo Despus de Su Visita  Tambin puede enviarnos un mensaje a travs de Clinical cytogeneticist. Por lo general respondemos a los mensajes de MyChart en el transcurso de 1 a 2 das hbiles.  Para renovar recetas, por favor pida a su farmacia que se ponga en contacto con nuestra oficina. Annie Sable de fax es [place] [phone].  Si tiene un asunto urgente cuando la clnica est cerrada y que no puede esperar hasta el siguiente da hbil, puede llamar/localizar a su doctor(a) al nmero que aparece a continuacin.   Por favor, tenga en cuenta que aunque hacemos todo lo posible para estar disponibles para asuntos urgentes fuera del horario de [place], no estamos disponibles las 24 horas del da, los 7 [address]  [address] de la [place].   Si tiene un problema urgente y no puede comunicarse con nosotros, puede optar por buscar atencin mdica  en el consultorio de su doctor(a), en una clnica privada, en un centro de atencin urgente o en una sala de emergencias.  Si tiene Engineer, drilling, por favor llame inmediatamente al 911 o vaya a la sala de emergencias.  Nmeros de bper  - Dr. Gwen Pounds: [phone]  - Dra. Roseanne Reno: [phone]  - Dr. Katrinka Blazing: [phone]   En caso de inclemencias del tiempo, por favor llame a Lacy Duverney principal al [phone] para una actualizacin sobre el [facility] de cualquier retraso o cierre.  Consejos para la medicacin en dermatologa: Por favor, guarde las cajas en las que vienen los medicamentos de uso tpico para ayudarle a seguir las instrucciones sobre dnde y cmo usarlos. Las farmacias generalmente imprimen las instrucciones del medicamento slo en las cajas y no  directamente en los tubos del [place].   Si su medicamento es muy caro, por favor, pngase en contacto con Rolm Gala llamando al [phone] y presione la opcin 4 o envenos un mensaje a travs de Clinical cytogeneticist.   No podemos decirle cul ser su copago por los medicamentos por adelantado ya que esto es diferente dependiendo de la cobertura de su seguro. Sin embargo, es posible que podamos encontrar un medicamento sustituto a Audiological scientist un formulario para que el seguro cubra el medicamento que se considera necesario.   Si se requiere una autorizacin previa para que su compaa de seguros [place] su medicamento, por favor permtanos de 1 a 2 das hbiles para completar [address]  Los precios de los medicamentos varan con frecuencia dependiendo del Environmental consultant de dnde se surte la receta y alguna farmacias pueden ofrecer precios ms baratos.  El sitio web www.goodrx.com tiene cupones para medicamentos de Health and safety inspector. Los precios aqu no tienen en cuenta lo que podra costar con la ayuda del seguro (puede ser ms barato  con su seguro), pero el sitio web puede darle el precio si no Visual merchandiser.  - Puede imprimir el cupn correspondiente y llevarlo con su receta a la farmacia.  - Tambin puede pasar por nuestra oficina durante el horario de atencin regular y Education officer, museum una tarjeta de cupones de GoodRx.  - Si necesita que su receta se enve electrnicamente a una farmacia diferente, informe a nuestra oficina a travs de MyChart de [place] o por telfono llamando al [phone] y presione la opcin 4.

## 2023-05-03 ENCOUNTER — Encounter: Payer: Self-pay | Admitting: Dermatology

## 2023-07-15 ENCOUNTER — Other Ambulatory Visit: Payer: Self-pay | Admitting: Family Medicine

## 2023-07-15 DIAGNOSIS — G8929 Other chronic pain: Secondary | ICD-10-CM

## 2023-07-15 NOTE — Telephone Encounter (Signed)
Medication Refill -  Most Recent Primary Care Visit:  Provider: Alba Cory  Department: [facility]  Visit Type: OFFICE VISIT  Date: 02/23/2023  Medication: methocarbamol (ROBAXIN) 500 MG tablet   Has the patient contacted their pharmacy? No  Is this the correct pharmacy for this prescription? Yes If no, delete pharmacy and type the correct one.  This is the patient's preferred pharmacy: [id number] - [place], [place] - [address] AT [facility] [address] [place] [address] [place] [address] Phone: [phone] Fax: [phone]  Has the prescription been filled recently? Yes  Is the patient out of the medication? Yes  Has the patient been seen for an appointment in the last year OR does the patient have an upcoming appointment? Yes  Can we respond through MyChart? No  Agent: Please be advised that Rx refills may take up to 3 business days. We ask that you follow-up with your pharmacy.

## 2023-07-15 NOTE — Telephone Encounter (Signed)
Requested medication (s) are due for refill today -no  Requested medication (s) are on the active medication list -yes  Future visit scheduled -yes  Last refill: 02/23/23 #180  1RF  Notes to clinic: non delegated Rx  Requested Prescriptions  Pending Prescriptions Disp Refills   methocarbamol (ROBAXIN) 500 MG tablet 180 tablet 1    Sig: TAKE 1 TABLET(500 MG) BY MOUTH TWICE DAILY AS NEEDED FOR MUSCLE SPASMS     Not Delegated - Analgesics:  Muscle Relaxants Failed - 07/15/2023 11:16 AM      Failed - This refill cannot be delegated      Passed - Valid encounter within last 6 months    Recent Outpatient Visits           4 months ago Dysmetabolic syndrome   [facility] Alba Cory, MD   10 months ago Major depression in remission [facility])   [facility] Alba Cory, MD   11 months ago Viral illness   [facility] Alba Cory, MD   1 year ago Major depression in remission [facility])   [facility] Alba Cory, MD   1 year ago Well adult exam   [facility] Alba Cory, MD       Future Appointments             In 1 month Alba Cory, MD [facility]   In 3 months Deirdre Evener, MD [facility]               Requested Prescriptions  Pending Prescriptions Disp Refills   methocarbamol (ROBAXIN) 500 MG tablet 180 tablet 1    Sig: TAKE 1 TABLET(500 MG) BY MOUTH TWICE DAILY AS NEEDED FOR MUSCLE SPASMS     Not Delegated - Analgesics:  Muscle Relaxants Failed - 07/15/2023 11:16 AM      Failed - This refill cannot be delegated      Passed - Valid encounter within last 6 months    Recent Outpatient Visits           4 months ago Dysmetabolic syndrome   [facility] Alba Cory, MD   10 months ago Major depression in remission [facility])   [facility] Alba Cory, MD   11 months ago Viral illness   [facility] Alba Cory, MD   1 year ago Major depression in remission [facility])   [facility] Alba Cory, MD   1 year ago Well adult exam   [facility] Alba Cory, MD       Future Appointments             In 1 month Carlynn Purl, Danna Hefty, MD [facility]   In 3 months Deirdre Evener, MD [facility]

## 2023-07-17 MED ORDER — METHOCARBAMOL 500 MG PO TABS: ORAL_TABLET | ORAL | 0 refills | Status: DC

## 2023-08-07 ENCOUNTER — Ambulatory Visit: Payer: Managed Care, Other (non HMO) | Admitting: Family Medicine

## 2023-08-18 ENCOUNTER — Encounter: Payer: Self-pay | Admitting: Oncology

## 2023-08-25 ENCOUNTER — Encounter: Payer: Self-pay | Admitting: Family Medicine

## 2023-08-25 ENCOUNTER — Ambulatory Visit (INDEPENDENT_AMBULATORY_CARE_PROVIDER_SITE_OTHER): Payer: BC Managed Care – PPO | Admitting: Family Medicine

## 2023-08-25 VITALS — BP 114/76 | HR 97 | Temp 97.8°F | Resp 16 | Ht 60.0 in | Wt 160.4 lb

## 2023-08-25 DIAGNOSIS — E538 Deficiency of other specified B group vitamins: Secondary | ICD-10-CM

## 2023-08-25 DIAGNOSIS — Z23 Encounter for immunization: Secondary | ICD-10-CM

## 2023-08-25 DIAGNOSIS — F325 Major depressive disorder, single episode, in full remission: Secondary | ICD-10-CM

## 2023-08-25 DIAGNOSIS — E559 Vitamin D deficiency, unspecified: Secondary | ICD-10-CM

## 2023-08-25 DIAGNOSIS — E8881 Metabolic syndrome: Secondary | ICD-10-CM

## 2023-08-25 DIAGNOSIS — G8929 Other chronic pain: Secondary | ICD-10-CM

## 2023-08-25 DIAGNOSIS — Z9884 Bariatric surgery status: Secondary | ICD-10-CM

## 2023-08-25 DIAGNOSIS — E785 Hyperlipidemia, unspecified: Secondary | ICD-10-CM

## 2023-08-25 DIAGNOSIS — M5442 Lumbago with sciatica, left side: Secondary | ICD-10-CM

## 2023-08-25 DIAGNOSIS — Z1231 Encounter for screening mammogram for malignant neoplasm of breast: Secondary | ICD-10-CM | POA: Diagnosis not present

## 2023-08-25 MED ORDER — DULOXETINE HCL 60 MG PO CPEP: 60.0000 mg | ORAL_CAPSULE | Freq: Every day | ORAL | 1 refills | Status: DC

## 2023-08-25 MED ORDER — METFORMIN HCL ER 500 MG PO TB24: 500.0000 mg | ORAL_TABLET | Freq: Every day | ORAL | 1 refills | Status: DC

## 2023-08-25 NOTE — Progress Notes (Signed)
 Name: Ashlee Cruz   MRN: [id number]    DOB: Nov 18, 1965   Date:08/25/2023       Progress Note  Subjective  Chief Complaint  Chief Complaint  Patient presents with   Medical Management of Chronic Issues    HPI  Pre-diabetes: last A1C improved down to 5.5 %. She denies polyphagia, polydipsia or polyphagia. 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 Gerldine Maizes  in July 2017 and was doing great, but a few months after the surgery she had a flare. She was seen by Dr. Maizes and had steroid injection end of November 2017 and symptoms improved a little, she had another injection January 2018 She continues to have neck and back pain but stable and managed with medications    Dyslipidemia: LDL was 137    The 10-year ASCVD risk score (Arnett DK, et al., 2019) is: 1.3%   Values used to calculate the score:     Age: 58 years     Sex: Female     Is Non-Hispanic African American: No     Diabetic: No     Tobacco smoker: No     Systolic Blood Pressure: 114 mmHg     Is BP treated: No     HDL Cholesterol: 99 mg/dL     Total Cholesterol: 248 mg/dL    Major Depression Remission: she is now on Cymbalta  to help with pain and mood, she changed jobs after 15 years and is really happy with the change. Feels supported.  She still worries about her mother that lives in [place] and is getting sicker and would like for her to move in with her.   Palpitation: she has intermittent symptoms, occasionally gets dizzy when she gets up quickly, but no chest pain . Stable    B12 and vitamin D  deficiency: we will  continue to monitor    Eczema: both hands , well controlled with medication, sees Dr. Hester. Continue medications   Patient Active Problem List   Diagnosis Date Noted   Iron deficiency anemia 05/02/2020   Tachycardia 05/19/2017   Palpitations 05/19/2017   Fibroid uterus 12/20/2015   History of iron deficiency anemia 10/23/2015   Left lumbar radiculitis 10/23/2015   Absence of menstruation 04/26/2015   B12 deficiency 04/26/2015   Depression, major, recurrent, mild (HCC) 04/26/2015   Dyslipidemia 04/26/2015   Dysmetabolic syndrome 04/26/2015   Bariatric surgery status 04/26/2015   H/O: HTN (hypertension) 04/26/2015   Psoriasis 04/26/2015   Allergic rhinitis, seasonal 04/26/2015   Vitamin D  deficiency 04/26/2015   Bilateral knee pain 04/26/2015    Past Surgical History:  Procedure Laterality Date   BARIATRIC SURGERY     CHOLECYSTECTOMY     COLONOSCOPY WITH PROPOFOL  N/A 05/07/2020   Procedure: COLONOSCOPY WITH PROPOFOL ;  Surgeon: Janalyn Keene NOVAK, MD;  Location: [facility];  Service: Endoscopy;  Laterality: N/A;   DILATION AND CURETTAGE OF UTERUS     ESOPHAGOGASTRODUODENOSCOPY (EGD) WITH PROPOFOL  N/A 05/07/2020   Procedure: ESOPHAGOGASTRODUODENOSCOPY (EGD) WITH  BIOPSY;  Surgeon: Janalyn Keene NOVAK, MD;  Location[facility];  Service: Endoscopy;  Laterality: N/A;   LUMBAR SPINE SURGERY Left 02/2016   Pt reports lumbar surgery without placement of instrumentation to decompress nerve on left side.     Family History  Problem Relation Age of Onset   Diabetes Mother    Skin cancer Mother    Diabetes Father    COPD Father    Seizures Father    Heart disease Father 62       CABG   Diabetes Brother    Multiple sclerosis Brother    Stroke Paternal Grandmother     Social History   Tobacco Use   Smoking status: Never   Smokeless tobacco: Never  Substance Use Topics   Alcohol use: No    Alcohol/week: 0.0 standard drinks of alcohol     Current  Outpatient Medications:    acetaminophen  (TYLENOL ) 500 MG tablet, Take 1,000 mg by mouth as needed for moderate pain., Disp: , Rfl:    Apremilast  (OTEZLA ) 30 MG TABS, Take 1 tablet (30 mg total) by mouth 2 (two) times daily., Disp: 60 tablet, Rfl: 6   cholecalciferol (VITAMIN D3) 25 MCG (1000 UT) tablet, Take 2,000 Units by mouth daily., Disp: , Rfl:    DULoxetine  (CYMBALTA ) 60 MG capsule, Take 1 capsule (60 mg total) by mouth daily., Disp: 90 capsule, Rfl: 1   metFORMIN  (GLUCOPHAGE -XR) 500 MG 24 hr tablet, Take 1 tablet (500 mg total) by mouth daily with breakfast., Disp: 90 tablet, Rfl: 1   methocarbamol  (ROBAXIN ) 500 MG tablet, TAKE 1 TABLET(500 MG) BY MOUTH TWICE DAILY AS NEEDED FOR MUSCLE SPASMS, Disp: 180 tablet, Rfl: 0   Syringe/Needle, Disp, (SYRINGE 3CC/25GX5/8) 25G X 5/8 3 ML MISC, 3 mLs by Does not apply route every 30 (thirty) days., Disp: 11 each, Rfl: 0   Tapinarof  (VTAMA ) 1 % CREA, Apply 1 application topically daily. Apply to affected pink areas of hands and body for psoriasis (Patient not taking: Reported on 08/25/2023), Disp: 60 g, Rfl: 6  Allergies  Allergen Reactions   Other Other (See Comments)    Any fragrant causes psoriasus   Latex     Pt reports prolonged exposure causes aggravation in psoriasis   Codeine Rash    I personally reviewed active problem list, medication list, allergies, family history with the patient/caregiver today.   ROS  Ten systems reviewed and is negative except as mentioned in HPI    Objective  Vitals:   08/25/23 0856  BP: 114/76  Pulse: 97  Resp: 16  Temp: 97.8 F (36.6 C)  TempSrc: Oral  SpO2: 96%  Weight: 160 lb 6.4 oz (72.8 kg)  Height: 5' (1.524 m)    Body mass index is 31.33 kg/m.  Physical Exam  Constitutional: Patient appears well-developed and well-nourished. Obese No distress.  HEENT: head atraumatic, normocephalic, pupils equal and reactive to light, neck supple Cardiovascular: Normal rate, regular rhythm and  normal heart sounds.  No murmur heard. No BLE edema. Pulmonary/Chest: Effort normal and breath sounds normal. No respiratory distress. Abdominal: Soft.  There is no tenderness. Psychiatric: Patient has a normal mood and affect. behavior is normal. Judgment and thought content normal.   Diabetic Foot Exam:     PHQ2/9:    08/25/2023    8:54 AM 02/23/2023    8:05 AM 08/25/2022    7:58 AM 07/31/2022    9:48 AM 02/20/2022    9:41 AM  Depression screen PHQ 2/9  Decreased Interest 0 0 0 0 0  Down, Depressed, Hopeless 0 0 0 0 0  PHQ - 2 Score 0 0 0 0 0  Altered sleeping 0 1 0 0 0  Tired, decreased energy 0 0 0 0 0  Change in appetite 0 0 0 0 0  Feeling bad or failure about yourself  0 0 0 0 0  Trouble concentrating 0 1 0 0 0  Moving slowly or fidgety/restless 0 0 0 0 0  Suicidal thoughts 0 0 0 0 0  PHQ-9 Score 0 2 0 0 0  Difficult doing work/chores Not difficult at all Not difficult at all  Not difficult at all     phq 9 is negative   Fall Risk:    08/25/2023    8:48 AM 02/23/2023    8:04 AM 08/25/2022    7:58 AM 07/31/2022    9:48 AM 02/20/2022    9:41 AM  Fall Risk   Falls in the past year? 0 0 0 0 0  Number falls in past yr: 0 0 0 0 0  Injury with Fall? 0 0 0 0 0  Risk for fall due to : No Fall Risks  No Fall Risks No Fall Risks No Fall Risks  Follow up Falls prevention discussed;Education provided;Falls evaluation completed  Falls prevention discussed Falls prevention discussed;Education provided;Falls evaluation completed Falls prevention discussed     Assessment & Plan  1. Major depression in remission (HCC) (Primary)  - DULoxetine  (CYMBALTA ) 60 MG capsule; Take 1 capsule (60 mg total) by mouth daily.  Dispense: 90 capsule; Refill: 1  2. Need for influenza vaccination  - Flu vaccine trivalent PF, 6mos and older(Flulaval,Afluria,Fluarix,Fluzone)  3. Breast cancer screening by mammogram  - MM 3D SCREENING MAMMOGRAM BILATERAL BREAST; Future  4. Bariatric surgery  status  - metFORMIN  (GLUCOPHAGE -XR) 500 MG 24 hr tablet; Take 1 tablet (500 mg total) by mouth daily with breakfast.  Dispense: 90 tablet; Refill: 1  5. Dyslipidemia   6. Dysmetabolic syndrome  - metFORMIN  (GLUCOPHAGE -XR) 500 MG 24 hr tablet; Take 1 tablet (500 mg total) by mouth daily with breakfast.  Dispense: 90 tablet; Refill: 1  7. Chronic midline low back pain with left-sided sciatica  - DULoxetine  (CYMBALTA ) 60 MG capsule; Take 1 capsule (60 mg total) by mouth daily.  Dispense: 90 capsule; Refill: 1  8. Vitamin D  deficiency  Continue supplementation   9. B12 deficiency     We will recheck during her CPE

## 2023-09-09 ENCOUNTER — Other Ambulatory Visit: Payer: Self-pay

## 2023-09-09 DIAGNOSIS — L409 Psoriasis, unspecified: Secondary | ICD-10-CM

## 2023-09-09 MED ORDER — OTEZLA 30 MG PO TABS: 1.0000 | ORAL_TABLET | Freq: Two times a day (BID) | ORAL | 6 refills | Status: DC

## 2023-09-21 NOTE — Telephone Encounter (Signed)
 Opened in error; Disregard.

## 2023-10-02 ENCOUNTER — Telehealth: Payer: BC Managed Care – PPO | Admitting: Family Medicine

## 2023-10-02 ENCOUNTER — Encounter: Payer: Self-pay | Admitting: Family Medicine

## 2023-10-02 ENCOUNTER — Ambulatory Visit: Payer: BC Managed Care – PPO | Attending: Family Medicine

## 2023-10-02 ENCOUNTER — Encounter: Payer: Self-pay | Admitting: Oncology

## 2023-10-02 ENCOUNTER — Telehealth: Payer: Managed Care, Other (non HMO)

## 2023-10-02 ENCOUNTER — Ambulatory Visit: Payer: Self-pay

## 2023-10-02 DIAGNOSIS — R000 Tachycardia, unspecified: Secondary | ICD-10-CM | POA: Diagnosis not present

## 2023-10-02 DIAGNOSIS — B9689 Other specified bacterial agents as the cause of diseases classified elsewhere: Secondary | ICD-10-CM | POA: Diagnosis not present

## 2023-10-02 DIAGNOSIS — J208 Acute bronchitis due to other specified organisms: Secondary | ICD-10-CM

## 2023-10-02 MED ORDER — AZITHROMYCIN 250 MG PO TABS: ORAL_TABLET | ORAL | 0 refills | Status: AC

## 2023-10-02 MED ORDER — HYDROCOD POLI-CHLORPHE POLI ER 10-8 MG/5ML PO SUER
5.0000 mL | Freq: Every evening | ORAL | 0 refills | Status: DC | PRN
Start: 2023-10-02 — End: 2023-10-14

## 2023-10-02 MED ORDER — BENZONATATE 100 MG PO CAPS: 100.0000 mg | ORAL_CAPSULE | Freq: Three times a day (TID) | ORAL | 0 refills | Status: DC | PRN

## 2023-10-02 NOTE — Progress Notes (Signed)
Name: Ashlee Cruz   MRN: [id number]    DOB: [birth year]   Date:10/02/2023       Progress Note  Subjective  Chief Complaint  Chief Complaint  Patient presents with   Headache   Cough    Gets consistent episodes of coughing   Sore Throat    Onset for 2 weeks     I connected with  Ashlee Cruz  on 10/02/23 at  3:20 PM EST by a video enabled telemedicine application and verified that I am speaking with the correct person using two identifiers.  I discussed the limitations of evaluation and management by telemedicine and the availability of in person appointments. The patient expressed understanding and agreed to proceed with a virtual visit  Staff also discussed with the patient that there may be a patient responsible charge related to this service. Patient Location: at home Provider Location: [facility] Additional Individuals present: alone  Discussed the use of AI scribe software for clinical note transcription with the patient, who gave verbal consent to proceed.  History of Present Illness   Ashlee Cruz is a 58 year old female with tachycardia who presents with persistent cough and episodes of high pulse rate.  She has been experiencing a persistent cough since last Thursday, which initially improved by late Sunday, allowing her to work on Monday and Tuesday. However, the cough worsened again on Tuesday night, described as severe and feeling like 'razor blades' in her throat. No fever is present, but she notes a scratchy throat. The cough is relentless and minimally productive.  She experiences episodes of high pulse rate, with her Apple Watch recording rates between 120 and 150 bpm on two occasions last week, one of which occurred before she became ill. Today, her pulse rate spiked to 160 bpm while doing laundry, taking five to six minutes to decrease. Her pulse rate was 90 bpm three minutes prior to the conversation, which she considers normal. She has a long history of tachycardia but  is not on medication due to low blood pressure. She has seen a cardiologist before but does not recall using a Ziopatch.  She has taken Theraflu in the past week, which may contain pseudoephedrine, potentially contributing to her elevated heart rate. She denies taking any other medications that could raise her heart rate.  Her appetite has been poor today, with a significant decrease in her usual intake. She is not allergic to anything except latex and codeine, although she has tolerated hydrocodone in the past.        Patient Active Problem List   Diagnosis Date Noted   Iron deficiency anemia 05/02/2020   Tachycardia 05/19/2017   Palpitations 05/19/2017   Fibroid uterus 12/20/2015   History of iron deficiency anemia 10/23/2015   Left lumbar radiculitis 10/23/2015   Absence of menstruation 04/26/2015   B12 deficiency 04/26/2015   Depression, major, recurrent, mild (HCC) 04/26/2015   Dyslipidemia 04/26/2015   Dysmetabolic syndrome 04/26/2015   Bariatric surgery status 04/26/2015   H/O: HTN (hypertension) 04/26/2015   Psoriasis 04/26/2015   Allergic rhinitis, seasonal 04/26/2015   Vitamin D deficiency 04/26/2015   Bilateral knee pain 04/26/2015    Social History   Tobacco Use   Smoking status: Never   Smokeless tobacco: Never  Substance Use Topics   Alcohol use: No    Alcohol/week: 0.0 standard drinks of alcohol     Current Outpatient Medications:    acetaminophen (TYLENOL) 500 MG tablet, Take 1,000 mg by  mouth as needed for moderate pain., Disp: , Rfl:    Apremilast (OTEZLA) 30 MG TABS, Take 1 tablet (30 mg total) by mouth 2 (two) times daily., Disp: 60 tablet, Rfl: 6   azithromycin (ZITHROMAX) 250 MG tablet, Take 2 tablets on day 1, then 1 tablet daily on days 2 through 5, Disp: 6 tablet, Rfl: 0   benzonatate (TESSALON) 100 MG capsule, Take 1-2 capsules (100-200 mg total) by mouth 3 (three) times daily as needed for cough., Disp: 40 capsule, Rfl: 0    chlorpheniramine-HYDROcodone (TUSSIONEX) 10-8 MG/5ML, Take 5 mLs by mouth at bedtime as needed for cough., Disp: 115 mL, Rfl: 0   cholecalciferol (VITAMIN D3) 25 MCG (1000 UT) tablet, Take 2,000 Units by mouth daily., Disp: , Rfl:    DULoxetine (CYMBALTA) 60 MG capsule, Take 1 capsule (60 mg total) by mouth daily., Disp: 90 capsule, Rfl: 1   metFORMIN (GLUCOPHAGE-XR) 500 MG 24 hr tablet, Take 1 tablet (500 mg total) by mouth daily with breakfast., Disp: 90 tablet, Rfl: 1   methocarbamol (ROBAXIN) 500 MG tablet, TAKE 1 TABLET(500 MG) BY MOUTH TWICE DAILY AS NEEDED FOR MUSCLE SPASMS (Patient not taking: Reported on 10/02/2023), Disp: 180 tablet, Rfl: 0   Tapinarof (VTAMA) 1 % CREA, Apply 1 application topically daily. Apply to affected pink areas of hands and body for psoriasis (Patient not taking: Reported on 10/02/2023), Disp: 60 g, Rfl: 6  Allergies  Allergen Reactions   Other Other (See Comments)    Any fragrant causes psoriasus   Latex     Pt reports prolonged exposure causes aggravation in psoriasis   Codeine Rash    I personally reviewed active problem list, medication list, allergies with the patient/caregiver today.  ROS  Ten systems reviewed and is negative except as mentioned in HPI    Objective  Virtual encounter, vitals not obtained.  There is no height or weight on file to calculate BMI.  Nursing Note and Vital Signs reviewed.  Physical Exam  Awake, alert and oriented, no distress  Assessment and Plan    Upper Respiratory Infection/bacterial bronchitis Persistent cough, throat discomfort, and fatigue. No fever. Possible secondary infection after initial improvement. Recent use of over-the-counter medications with potential to increase heart rate. -Prescribe azithromycin to cover potential bacterial bronchitis. -Discontinue all over-the-counter medications that may increase heart rate. -Prescribe benzonatate 1-2 capsules up to three times a day for cough  suppression. -Prescribe hydrocodone cough syrup for nighttime use. -Recommend hydration, nutrition, and gentle activity for immune support. -Advise against visiting elderly mother due to potential infectious risk.  Tachycardia History of tachycardia with recent episodes of increased heart rate, potentially related to illness and/or over-the-counter medication use. -Order Zio patch for 14-day heart rate monitoring. -Advise patient to apply patch if heart rate does not return to baseline after discontinuing over-the-counter medications. -Check for potential drug interactions with new prescriptions.           -Red flags and when to present for emergency care or RTC including fever >101.31F, chest pain, shortness of breath, new/worsening/un-resolving symptoms,  reviewed with patient at time of visit. Follow up and care instructions discussed and provided in AVS. - I discussed the assessment and treatment plan with the patient. The patient was provided an opportunity to ask questions and all were answered. The patient agreed with the plan and demonstrated an understanding of the instructions.  I provided 25 minutes of non-face-to-face time during this encounter.  Ruel Favors, MD

## 2023-10-02 NOTE — Telephone Encounter (Signed)
Message from Marcy S sent at 10/02/2023  9:47 AM EST  Summary: Cough, headache, scratchy throat   The patient called in stating she has not felt well for over a week. She says she started taking Theraflu due to her symptoms. She states she has a cough, headache, and scratchy throat. She called in for a virtual appt and no openings at [employer] or [employer]. Please assist patient further         Chief Complaint: cough Symptoms: headache, throat pain w/cough Frequency: last week Pertinent Negatives: Patient denies fever,SOB Disposition: [] ED /[] Urgent Care (no appt availability in office) / [] Appointment(In office/virtual)/ [x]  [facility]/ [] Home Care/ [] Refused Recommended Disposition /[] [facility]/ []  Follow-up with PCP Additional Notes: NO APPTS  Reason for Disposition  SEVERE coughing spells (e.g., whooping sound after coughing, vomiting after coughing)  Answer Assessment - Initial Assessment Questions 1. ONSET: "When did the cough begin?"      Last week  2. SEVERITY: "How bad is the cough today?"      coughing 3. SPUTUM: "Describe the color of your sputum" (none, dry cough; clear, white, yellow, green)     Dry cough   5. DIFFICULTY BREATHING: "Are you having difficulty breathing?" If Yes, ask: "How bad is it?" (e.g., mild, moderate, severe)    - MILD: No SOB at rest, mild SOB with walking, speaks normally in sentences, can lie down, no retractions, pulse < 100.    - MODERATE: SOB at rest, SOB with minimal exertion and prefers to sit, cannot lie down flat, speaks in phrases, mild retractions, audible wheezing, pulse 100-120.    - SEVERE: Very SOB at rest, speaks in single words, struggling to breathe, sitting hunched forward, retractions, pulse > 120      no 6. FEVER: "Do you have a fever?" If Yes, ask: "What is your temperature, how was it measured, and when did it start?"     no 7. CARDIAC HISTORY: "Do you have any history of heart disease?"  (e.g., heart attack, congestive heart failure)      no 8. LUNG HISTORY: "Do you have any history of lung disease?"  (e.g., pulmonary embolus, asthma, emphysema)     no 10. OTHER SYMPTOMS: "Do you have any other symptoms?" (e.g., runny nose, wheezing, chest pain)       Cough, throat pain when coughing, sneezing, nasal congestion  Protocols used: Cough - Acute Productive-A-AH

## 2023-10-02 NOTE — Telephone Encounter (Signed)
Pt has a virtual with Dr Carlynn Purl today

## 2023-10-14 ENCOUNTER — Ambulatory Visit (INDEPENDENT_AMBULATORY_CARE_PROVIDER_SITE_OTHER): Payer: BC Managed Care – PPO | Admitting: Family Medicine

## 2023-10-14 ENCOUNTER — Encounter: Payer: Self-pay | Admitting: Family Medicine

## 2023-10-14 VITALS — BP 106/64 | HR 90 | Resp 16 | Ht 60.25 in | Wt 160.0 lb

## 2023-10-14 DIAGNOSIS — E2839 Other primary ovarian failure: Secondary | ICD-10-CM

## 2023-10-14 DIAGNOSIS — Z9884 Bariatric surgery status: Secondary | ICD-10-CM

## 2023-10-14 DIAGNOSIS — E785 Hyperlipidemia, unspecified: Secondary | ICD-10-CM

## 2023-10-14 DIAGNOSIS — Z0001 Encounter for general adult medical examination with abnormal findings: Secondary | ICD-10-CM | POA: Diagnosis not present

## 2023-10-14 DIAGNOSIS — E538 Deficiency of other specified B group vitamins: Secondary | ICD-10-CM | POA: Diagnosis not present

## 2023-10-14 DIAGNOSIS — Z13820 Encounter for screening for osteoporosis: Secondary | ICD-10-CM

## 2023-10-14 DIAGNOSIS — E8881 Metabolic syndrome: Secondary | ICD-10-CM

## 2023-10-14 DIAGNOSIS — E559 Vitamin D deficiency, unspecified: Secondary | ICD-10-CM

## 2023-10-14 DIAGNOSIS — Z79899 Other long term (current) drug therapy: Secondary | ICD-10-CM

## 2023-10-14 DIAGNOSIS — Z0000 Encounter for general adult medical examination without abnormal findings: Secondary | ICD-10-CM

## 2023-10-14 DIAGNOSIS — Z1231 Encounter for screening mammogram for malignant neoplasm of breast: Secondary | ICD-10-CM

## 2023-10-14 NOTE — Progress Notes (Signed)
 Name: Ashlee Cruz   MRN: [id number]    DOB: 24-Jan-1966   Date:10/14/2023       Progress Note  Subjective  Chief Complaint  Chief Complaint  Patient presents with   Annual Exam    HPI  Patient presents for annual CPE.  Diet: cooks at home but still eats out a few times a week but tries to make healthier choices when eating out  Exercise:  discussed regular physical activity Last Eye Exam: completed Last Dental Exam: completed  Flowsheet Row Office Visit from 10/14/2023 in [facility]  AUDIT-C Score 0      Depression: Phq 9 is  negative    10/14/2023    8:01 AM 10/02/2023   11:44 AM 08/25/2023    8:54 AM 02/23/2023    8:05 AM 08/25/2022    7:58 AM  Depression screen PHQ 2/9  Decreased Interest 0 0 0 0 0  Down, Depressed, Hopeless 0 0 0 0 0  PHQ - 2 Score 0 0 0 0 0  Altered sleeping 0 0 0 1 0  Tired, decreased energy 0 0 0 0 0  Change in appetite 0 0 0 0 0  Feeling bad or failure about yourself  0 0 0 0 0  Trouble concentrating 0 0 0 1 0  Moving slowly or fidgety/restless 0 0 0 0 0  Suicidal thoughts 0 0 0 0 0  PHQ-9 Score 0 0 0 2 0  Difficult doing work/chores Not difficult at all Not difficult at all Not difficult at all Not difficult at all    Hypertension: BP Readings from Last 3 Encounters:  10/14/23 106/64  08/25/23 114/76  04/30/23 122/83   Obesity: Wt Readings from Last 3 Encounters:  10/14/23 160 lb (72.6 kg)  08/25/23 160 lb 6.4 oz (72.8 kg)  02/23/23 159 lb 11.2 oz (72.4 kg)   BMI Readings from Last 3 Encounters:  10/14/23 30.99 kg/m  08/25/23 31.33 kg/m  02/23/23 31.19 kg/m     Vaccines: reviewed with the patient.   Hep C Screening: completed STD testing and prevention (HIV/chl/gon/syphilis): N/A Intimate partner violence: negative screen  Sexual History :one partner, no desire but no pain during intercourse  Menstrual History/LMP/Abnormal Bleeding: post menopausal  Discussed importance of follow up if any  post-menopausal bleeding: yes  Incontinence Symptoms: positive for symptoms. Mild stress incontinence when bladder is full  Breast cancer:  - Last Mammogram: needs to schedule it  - BRCA gene screening: N/A  Osteoporosis Prevention : Discussed high calcium and vitamin D supplementation, weight bearing exercises Bone density :yes   Cervical cancer screening: up-to-date  Skin cancer: Discussed monitoring for atypical lesions  Colorectal cancer: repeat in 2031   Lung cancer:  Low Dose CT Chest recommended if Age 45-80 years, 20 pack-year currently smoking OR have quit w/in 15years. Patient does not qualify for screen   ECG: 2019   Advanced Care Planning: A voluntary discussion about advance care planning including the explanation and discussion of advance directives.  Discussed health care proxy and Living will, and the patient was able to identify a health care proxy as husband.  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   Dyslipidemia 04/26/2015   Dysmetabolic syndrome 04/26/2015   Bariatric surgery status 04/26/2015   H/O: HTN (hypertension) 04/26/2015   Psoriasis 04/26/2015   Allergic rhinitis, seasonal 04/26/2015   Vitamin D deficiency 04/26/2015   Bilateral knee pain 04/26/2015    Past Surgical History:  Procedure Laterality Date   BARIATRIC SURGERY     CHOLECYSTECTOMY     COLONOSCOPY WITH PROPOFOL N/A 05/07/2020   Procedure: COLONOSCOPY WITH PROPOFOL;  Surgeon: Pasty Spillers, MD;  Location: [facility];  Service: Endoscopy;  Laterality: N/A;   DILATION AND  CURETTAGE OF UTERUS     ESOPHAGOGASTRODUODENOSCOPY (EGD) WITH PROPOFOL N/A 05/07/2020   Procedure: ESOPHAGOGASTRODUODENOSCOPY (EGD) WITH BIOPSY;  Surgeon: Pasty Spillers, MD;  Location: [facility];  Service: Endoscopy;  Laterality: N/A;   LUMBAR SPINE SURGERY Left 02/2016   Pt reports lumbar surgery without placement of instrumentation to decompress nerve on left side.     Family History  Problem Relation Age of Onset   Diabetes Mother    Skin cancer Mother    Diabetes Father    COPD Father    Seizures Father    Heart disease Father 70       CABG   Diabetes Brother    Multiple sclerosis Brother    Stroke Paternal Grandmother     Social History   Socioeconomic History   Marital status: Married    Spouse name: Caryn Bee   Number of children: 1   Years of education: Not on file   Highest education level: Associate degree: occupational, Scientist, product/process development, or vocational program  Occupational History   Occupation: Photographer   Tobacco Use   Smoking status: Never   Smokeless tobacco: Never  Vaping Use   Vaping status: Never Used  Substance and Sexual Activity   Alcohol use: No    Alcohol/week: 0.0 standard drinks of alcohol   Drug use: No   Sexual activity: Yes    Partners: Male    Birth control/protection: None  Other Topics Concern   Not on file  Social History Narrative   Not on file   Social Drivers of Health   Financial Resource Strain: Low Risk  (10/14/2023)   Overall Financial Resource Strain (CARDIA)    Difficulty of Paying Living Expenses: Not hard at all  Food Insecurity: No Food Insecurity (10/14/2023)   Hunger Vital Sign    Worried About Running Out of Food in the Last Year: Never true    Ran Out of Food in the Last Year: Never true  Transportation Needs: No Transportation Needs (10/14/2023)   PRAPARE - Administrator, Civil Service (Medical): No    Lack of Transportation (Non-Medical): No  Physical Activity: Insufficiently Active  (10/14/2023)   Exercise Vital Sign    Days of Exercise per Week: 2 days    Minutes of Exercise per Session: 20 min  Stress: Stress Concern Present (10/14/2023)   [employer] of Occupational Health - Occupational Stress Questionnaire    Feeling of Stress : To some extent  Social Connections: Moderately Integrated (10/14/2023)   Social Connection and Isolation Panel [NHANES]    Frequency of Communication with Friends and Family: More than three times a week    Frequency of Social Gatherings with Friends and Family: More than three times a week    Attends Religious Services: More than 4 times per year  Active Member of Clubs or Organizations: No    Attends Banker Meetings: Never    Marital Status: Married  Catering manager Violence: Not At Risk (10/14/2023)   Humiliation, Afraid, Rape, and Kick questionnaire    Fear of Current or Ex-Partner: No    Emotionally Abused: No    Physically Abused: No    Sexually Abused: No     Current Outpatient Medications:    acetaminophen (TYLENOL) 500 MG tablet, Take 1,000 mg by mouth as needed for moderate pain., Disp: , Rfl:    Apremilast (OTEZLA) 30 MG TABS, Take 1 tablet (30 mg total) by mouth 2 (two) times daily., Disp: 60 tablet, Rfl: 6   benzonatate (TESSALON) 100 MG capsule, Take 1-2 capsules (100-200 mg total) by mouth 3 (three) times daily as needed for cough., Disp: 40 capsule, Rfl: 0   chlorpheniramine-HYDROcodone (TUSSIONEX) 10-8 MG/5ML, Take 5 mLs by mouth at bedtime as needed for cough., Disp: 115 mL, Rfl: 0   cholecalciferol (VITAMIN D3) 25 MCG (1000 UT) tablet, Take 2,000 Units by mouth daily., Disp: , Rfl:    DULoxetine (CYMBALTA) 60 MG capsule, Take 1 capsule (60 mg total) by mouth daily., Disp: 90 capsule, Rfl: 1   metFORMIN (GLUCOPHAGE-XR) 500 MG 24 hr tablet, Take 1 tablet (500 mg total) by mouth daily with breakfast., Disp: 90 tablet, Rfl: 1   methocarbamol (ROBAXIN) 500 MG tablet, TAKE 1 TABLET(500 MG) BY MOUTH  TWICE DAILY AS NEEDED FOR MUSCLE SPASMS (Patient not taking: Reported on 10/14/2023), Disp: 180 tablet, Rfl: 0   Tapinarof (VTAMA) 1 % CREA, Apply 1 application topically daily. Apply to affected pink areas of hands and body for psoriasis (Patient not taking: Reported on 10/14/2023), Disp: 60 g, Rfl: 6  Allergies  Allergen Reactions   Other Other (See Comments)    Any fragrant causes psoriasus   Latex     Pt reports prolonged exposure causes aggravation in psoriasis   Codeine Rash     ROS  Constitutional: Negative for fever or weight change.  Respiratory: Negative for cough and shortness of breath.   Cardiovascular: Negative for chest pain or palpitations.  Gastrointestinal: Negative for abdominal pain, no bowel changes.  Musculoskeletal: Negative for gait problem or joint swelling.  Skin: Negative for rash.  Neurological: Negative for dizziness or headache.  No other specific complaints in a complete review of systems (except as listed in HPI above).   Objective  Vitals:   10/14/23 0803  BP: 106/64  Pulse: 90  Resp: 16  SpO2: 96%  Weight: 160 lb (72.6 kg)  Height: 5' 0.25" (1.53 m)    Body mass index is 30.99 kg/m.  Physical Exam  Constitutional: Patient appears well-developed and well-nourished. Obese No distress.  HENT: Head: Normocephalic and atraumatic. Ears: B TMs ok, no erythema or effusion; Nose: Nose normal. Mouth/Throat: Oropharynx is clear and moist. No oropharyngeal exudate.  Eyes: Conjunctivae and EOM are normal. Pupils are equal, round, and reactive to light. No scleral icterus.  Neck: Normal range of motion. Neck supple. No JVD present. No thyromegaly present.  Cardiovascular: Normal rate, regular rhythm and normal heart sounds.  No murmur heard. No BLE edema. Pulmonary/Chest: Effort normal and breath sounds normal. No respiratory distress. Abdominal: Soft. Bowel sounds are normal, no distension. There is no tenderness. no masses Breast: no lumps or  masses, no nipple discharge or rashes FEMALE GENITALIA:  Not done  RECTAL: not done  Musculoskeletal: Normal range of motion, no joint effusions. No gross  deformities Neurological: he is alert and oriented to person, place, and time. No cranial nerve deficit. Coordination, balance, strength, speech and gait are normal.  Skin: Skin is warm and dry. No rash noted. No erythema.  Psychiatric: Patient has a normal mood and affect. behavior is normal. Judgment and thought content normal.     Assessment & Plan   1. Well adult exam (Primary)  Increase physical activity  2. Breast cancer screening by mammogram  She needs to schedule her mammogram  3. Osteoporosis screening  - DG Bone Density; Future  4. Ovarian failure  - DG Bone Density; Future   -USPSTF grade A and B recommendations reviewed with patient; age-appropriate recommendations, preventive care, screening tests, etc discussed and encouraged; healthy living encouraged; see AVS for patient education given to patient -Discussed importance of 150 minutes of physical activity weekly, eat two servings of fish weekly, eat one serving of tree nuts ( cashews, pistachios, pecans, almonds.Marland Kitchen) every other day, eat 6 servings of fruit/vegetables daily and drink plenty of water and avoid sweet beverages.   -Reviewed Health Maintenance: Yes.

## 2023-11-06 DIAGNOSIS — R000 Tachycardia, unspecified: Secondary | ICD-10-CM | POA: Diagnosis not present

## 2023-11-11 ENCOUNTER — Encounter: Payer: Self-pay | Admitting: Dermatology

## 2023-11-11 ENCOUNTER — Ambulatory Visit: Payer: Managed Care, Other (non HMO) | Admitting: Dermatology

## 2023-11-11 DIAGNOSIS — D1801 Hemangioma of skin and subcutaneous tissue: Secondary | ICD-10-CM

## 2023-11-11 DIAGNOSIS — L578 Other skin changes due to chronic exposure to nonionizing radiation: Secondary | ICD-10-CM

## 2023-11-11 DIAGNOSIS — L409 Psoriasis, unspecified: Secondary | ICD-10-CM

## 2023-11-11 DIAGNOSIS — W908XXA Exposure to other nonionizing radiation, initial encounter: Secondary | ICD-10-CM | POA: Diagnosis not present

## 2023-11-11 DIAGNOSIS — Z808 Family history of malignant neoplasm of other organs or systems: Secondary | ICD-10-CM

## 2023-11-11 DIAGNOSIS — L814 Other melanin hyperpigmentation: Secondary | ICD-10-CM

## 2023-11-11 DIAGNOSIS — Z1283 Encounter for screening for malignant neoplasm of skin: Secondary | ICD-10-CM

## 2023-11-11 DIAGNOSIS — L821 Other seborrheic keratosis: Secondary | ICD-10-CM

## 2023-11-11 DIAGNOSIS — Z7189 Other specified counseling: Secondary | ICD-10-CM

## 2023-11-11 DIAGNOSIS — D229 Melanocytic nevi, unspecified: Secondary | ICD-10-CM

## 2023-11-11 DIAGNOSIS — Z79899 Other long term (current) drug therapy: Secondary | ICD-10-CM

## 2023-11-11 MED ORDER — OTEZLA 30 MG PO TABS: 1.0000 | ORAL_TABLET | Freq: Two times a day (BID) | ORAL | 11 refills | Status: DC

## 2023-11-11 NOTE — Progress Notes (Signed)
 Follow-Up Visit   Subjective  Ashlee Cruz is a 58 y.o. female who presents for the following: Skin Cancer Screening and Full Body Skin Exam Hx of psoriasis at b/l palms controlled with Henderson [place].   The patient presents for Total-Body Skin Exam (TBSE) for skin cancer screening and mole check. The patient has spots, moles and lesions to be evaluated, some may be new or changing and the patient may have concern these could be cancer.  The following portions of the chart were reviewed this encounter and updated as appropriate: medications, allergies, medical history  Review of Systems:  No other skin or systemic complaints except as noted in HPI or Assessment and Plan.  Objective  Well appearing patient in no apparent distress; mood and affect are within normal limits.  A full examination was performed including scalp, head, eyes, ears, nose, lips, neck, chest, axillae, abdomen, back, buttocks, bilateral upper extremities, bilateral lower extremities, hands, feet, fingers, toes, fingernails, and toenails. All findings within normal limits unless otherwise noted below.   Relevant physical exam findings are noted in the Assessment and Plan.   Assessment & Plan   SKIN CANCER SCREENING PERFORMED TODAY.  ACTINIC DAMAGE - Chronic condition, secondary to cumulative UV/sun exposure - diffuse scaly erythematous macules with underlying dyspigmentation - Recommend daily broad spectrum sunscreen SPF 30+ to sun-exposed areas, reapply every 2 hours as needed.  - Staying in the shade or wearing long sleeves, sun glasses (UVA+UVB protection) and wide brim hats (4-inch brim around the entire circumference of the hat) are also recommended for sun protection.  - Call for new or changing lesions.  LENTIGINES, SEBORRHEIC KERATOSES, HEMANGIOMAS - Benign normal skin lesions - Benign-appearing - Call for any changes  MELANOCYTIC NEVI - Tan-brown and/or pink-flesh-colored symmetric macules and papules -  Benign appearing on exam today - Observation - Call clinic for new or changing moles - Recommend daily use of broad spectrum spf 30+ sunscreen to sun-exposed areas.   Family history of skin cancer - what type(s): Unknown - who affected: Mother, grandmother  PSORIASIS Clear at b/l palms   0% BSA on Otezla  Chronic condition with duration or expected duration over one year. Currently well-controlled on Otezla  No depression, No diarrhea, some headaches but controlled with tylenol Patient has been taking Henderson [place] since September 2021   Patient denies joint pain   Counseling and coordination of care for severe psoriasis on systemic treatment  psoriasis - severe on systemic treatment.  Psoriasis is a chronic non-curable, but treatable genetic/hereditary disease that may have other systemic features affecting other organ systems such as joints (Psoriatic Arthritis).  It is linked with heart disease, inflammatory bowel disease, non-alcoholic fatty liver disease, and depression. Significant skin psoriasis and/or psoriatic arthritis may have significant symptoms and affects activities of daily activity and often benefits from systemic treatments.  These systemic treatments have some potential side effects including immunosuppression and require pre-treatment laboratory screening and periodic laboratory monitoring and periodic in person evaluation and monitoring by the attending dermatologist physician (long term medication management).  Reviewed risks of biologics including immunosuppression, infections, injection site reaction, and failure to improve condition. Goal is control of skin condition, not cure.  Some older biologics such as Humira and Enbrel may slightly increase risk of malignancy and may worsen congestive heart failure.  Taltz and Cosentyx may cause inflammatory bowel disease to flare. The use of biologics requires long term medication management, including periodic office visits and  monitoring of blood work.  Treatment Plan: Continue Otezla 30 mg by mouth twice daily. Side effects of Otezla (apremilast) include diarrhea, nausea, headache, upper respiratory infection, depression, and weight decrease (5-10%). It should only be taken by pregnant women after a discussion regarding risks and benefits with their doctor. Goal is control of skin condition, not cure.  The use of Henderson [place] requires long term medication management, including periodic office visits.   Continue Epionce medical barrier cream daily.   Counseling on psoriasis and coordination of care  psoriasis is a chronic non-curable, but treatable genetic/hereditary disease that may have other systemic features affecting other organ systems such as joints (Psoriatic Arthritis). It is associated with an increased risk of inflammatory bowel disease, heart disease, non-alcoholic fatty liver disease, and depression.  Treatments include light and laser treatments; topical medications; and systemic medications including oral and injectables.   Long term medication management.  Patient is using long term (months to years) prescription medication  to control their dermatologic condition.  These medications require periodic monitoring to evaluate for efficacy and side effects and may require periodic laboratory monitoring.  ACTINIC SKIN DAMAGE   PSORIASIS   Related Medications Tapinarof (VTAMA) 1 % CREA Apply 1 application topically daily. Apply to affected pink areas of hands and body for psoriasis Apremilast (OTEZLA) 30 MG TABS Take 1 tablet (30 mg total) by mouth 2 (two) times daily. SKIN CANCER SCREENING   LENTIGO   MELANOCYTIC NEVUS, UNSPECIFIED LOCATION   FAMILY HISTORY OF SKIN CANCER   LONG-TERM USE OF HIGH-RISK MEDICATION   MEDICATION MANAGEMENT   COUNSELING AND COORDINATION OF CARE   Return in about 6 months (around 05/13/2024) for psoriasis, 1 year tbse .  IAsher Muir, CMA, am acting as  scribe for Armida Sans, MD.   Documentation: I have reviewed the above documentation for accuracy and completeness, and I agree with the above.  Armida Sans, MD

## 2023-11-11 NOTE — Patient Instructions (Addendum)

## 2023-11-15 DIAGNOSIS — R000 Tachycardia, unspecified: Secondary | ICD-10-CM | POA: Diagnosis not present

## 2023-11-16 ENCOUNTER — Encounter: Payer: Self-pay | Admitting: Family Medicine

## 2023-12-18 ENCOUNTER — Ambulatory Visit: Admitting: Family Medicine

## 2023-12-30 DIAGNOSIS — Z0000 Encounter for general adult medical examination without abnormal findings: Secondary | ICD-10-CM | POA: Diagnosis not present

## 2023-12-30 DIAGNOSIS — D509 Iron deficiency anemia, unspecified: Secondary | ICD-10-CM | POA: Diagnosis not present

## 2023-12-31 ENCOUNTER — Ambulatory Visit: Payer: Self-pay | Admitting: Family Medicine

## 2023-12-31 LAB — TSH: TSH: 1.68 u[IU]/mL (ref 0.450–4.500)

## 2023-12-31 LAB — CBC WITH DIFFERENTIAL/PLATELET
Basophils Absolute: 0.1 10*3/uL (ref 0.0–0.2)
Basos: 1 %
EOS (ABSOLUTE): 0.2 10*3/uL (ref 0.0–0.4)
Eos: 4 %
Hematocrit: 47 % — ABNORMAL HIGH (ref 34.0–46.6)
Hemoglobin: 15.6 g/dL (ref 11.1–15.9)
Immature Grans (Abs): 0 10*3/uL (ref 0.0–0.1)
Immature Granulocytes: 0 %
Lymphocytes Absolute: 1.7 10*3/uL (ref 0.7–3.1)
Lymphs: 29 %
MCH: 31.3 pg (ref 26.6–33.0)
MCHC: 33.2 g/dL (ref 31.5–35.7)
MCV: 94 fL (ref 79–97)
Monocytes Absolute: 0.4 10*3/uL (ref 0.1–0.9)
Monocytes: 8 %
Neutrophils Absolute: 3.5 10*3/uL (ref 1.4–7.0)
Neutrophils: 58 %
Platelets: 346 10*3/uL (ref 150–450)
RBC: 4.98 x10E6/uL (ref 3.77–5.28)
RDW: 11.8 % (ref 11.7–15.4)
WBC: 5.9 10*3/uL (ref 3.4–10.8)

## 2023-12-31 LAB — IRON,TIBC AND FERRITIN PANEL
Ferritin: 36 ng/mL (ref 15–150)
Iron Saturation: 25 % (ref 15–55)
Iron: 118 ug/dL (ref 27–159)
Total Iron Binding Capacity: 472 ug/dL — ABNORMAL HIGH (ref 250–450)
UIBC: 354 ug/dL (ref 131–425)

## 2023-12-31 LAB — COMPREHENSIVE METABOLIC PANEL WITH GFR
ALT: 16 IU/L (ref 0–32)
AST: 20 IU/L (ref 0–40)
Albumin: 4.9 g/dL (ref 3.8–4.9)
Alkaline Phosphatase: 117 IU/L (ref 44–121)
BUN/Creatinine Ratio: 13 (ref 9–23)
BUN: 9 mg/dL (ref 6–24)
Bilirubin Total: 0.7 mg/dL (ref 0.0–1.2)
CO2: 23 mmol/L (ref 20–29)
Calcium: 9.8 mg/dL (ref 8.7–10.2)
Chloride: 101 mmol/L (ref 96–106)
Creatinine, Ser: 0.67 mg/dL (ref 0.57–1.00)
Globulin, Total: 2.4 g/dL (ref 1.5–4.5)
Glucose: 86 mg/dL (ref 70–99)
Potassium: 4.8 mmol/L (ref 3.5–5.2)
Sodium: 142 mmol/L (ref 134–144)
Total Protein: 7.3 g/dL (ref 6.0–8.5)
eGFR: 103 mL/min/{1.73_m2} (ref >59)

## 2023-12-31 LAB — LIPID PANEL
Chol/HDL Ratio: 2.5 ratio (ref 0.0–4.4)
Cholesterol, Total: 275 mg/dL — ABNORMAL HIGH (ref 100–199)
HDL: 110 mg/dL (ref >39)
LDL Chol Calc (NIH): 150 mg/dL — ABNORMAL HIGH (ref 0–99)
Triglycerides: 90 mg/dL (ref 0–149)
VLDL Cholesterol Cal: 15 mg/dL (ref 5–40)

## 2023-12-31 LAB — VITAMIN D 25 HYDROXY (VIT D DEFICIENCY, FRACTURES): Vit D, 25-Hydroxy: 73.2 ng/mL (ref 30.0–100.0)

## 2023-12-31 LAB — HEMOGLOBIN A1C
Est. average glucose Bld gHb Est-mCnc: 114 mg/dL
Hgb A1c MFr Bld: 5.6 % (ref 4.8–5.6)

## 2023-12-31 LAB — B12 AND FOLATE PANEL
Folate: >20 ng/mL (ref >3.0)
Vitamin B-12: 769 pg/mL (ref 232–1245)

## 2024-01-01 ENCOUNTER — Encounter: Payer: Self-pay | Admitting: Oncology

## 2024-01-01 ENCOUNTER — Inpatient Hospital Stay: Payer: Managed Care, Other (non HMO) | Attending: Oncology | Admitting: Oncology

## 2024-01-01 ENCOUNTER — Other Ambulatory Visit: Payer: Managed Care, Other (non HMO)

## 2024-01-01 VITALS — BP 120/76 | HR 101 | Temp 98.1°F | Resp 19 | Ht 60.25 in | Wt 163.3 lb

## 2024-01-01 DIAGNOSIS — R5383 Other fatigue: Secondary | ICD-10-CM | POA: Insufficient documentation

## 2024-01-01 DIAGNOSIS — D509 Iron deficiency anemia, unspecified: Secondary | ICD-10-CM | POA: Insufficient documentation

## 2024-01-01 DIAGNOSIS — Z9884 Bariatric surgery status: Secondary | ICD-10-CM | POA: Insufficient documentation

## 2024-01-01 DIAGNOSIS — Z79899 Other long term (current) drug therapy: Secondary | ICD-10-CM | POA: Diagnosis not present

## 2024-01-01 NOTE — Progress Notes (Signed)
 Hematology/Oncology Consult note [facility]  [phone] [phone]  Patient Care Team: Sowles, Krichna, MD as PCP - General (Family Medicine)   Name of the patient: Ashlee Cruz  [id number]  [birth year]   Date of visit: 01/01/24  Diagnosis-history of iron and B12 deficiency anemia  Chief complaint/ Reason for visit-routine follow-up of anemia  Heme/Onc history: Patient is a 58 year old female with a past medical history significant for diabetes, depression who has been referred to us  for anemia.  Patient was recently seen by Dr. Tully Gainer for iron deficiency.  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  Recent labs were from June 2021 which showed H&H of 11.6/35.9 with an MCV of 81.  B12 and folate were normal.  Iron studies showed a low ferritin of 5 and low iron saturation of 6% with elevated TIBC of 494.  Patient reports ongoing fatigue.  Denies any blood loss in her stool or urine.  Denies any dark melanotic stools.  Denies any consistent use of NSAIDs.  No significant family history of colon cancer.  Last colonoscopy and upper endoscopy 2021 was unremarkable  Interval history-patient reports feeling fatigue especially in the afternoons when she needs to take a nap.  Denies any blood loss in her stool or urine.  Denies any dark melanotic stools  ECOG PS- 0 Pain scale- 0   Review of systems- Review of Systems  Constitutional:  Positive for malaise/fatigue. Negative for chills, fever and weight loss.  HENT:  Negative for congestion, ear discharge and nosebleeds.   Eyes:  Negative for blurred vision.  Respiratory:  Negative for cough, hemoptysis, sputum production, shortness of breath and wheezing.   Cardiovascular:  Negative for chest pain, palpitations, orthopnea and claudication.  Gastrointestinal:  Negative for abdominal pain, blood in stool, constipation, diarrhea, heartburn, melena, nausea and vomiting.   Genitourinary:  Negative for dysuria, flank pain, frequency, hematuria and urgency.  Musculoskeletal:  Negative for back pain, joint pain and myalgias.  Skin:  Negative for rash.  Neurological:  Negative for dizziness, tingling, focal weakness, seizures, weakness and headaches.  Endo/Heme/Allergies:  Does not bruise/bleed easily.  Psychiatric/Behavioral:  Negative for depression and suicidal ideas. The patient does not have insomnia.       Allergies  Allergen Reactions   Other Other (See Comments)    Any fragrant causes psoriasus   Latex     Pt reports prolonged exposure causes aggravation in psoriasis   Codeine Rash     Past Medical History:  Diagnosis Date   Allergic rhinitis    Anemia    Depression    Family history of adverse reaction to anesthesia    Mother - PONV   Hyperlipidemia    Hypertension    Metabolic syndrome    Obesity    PONV (postoperative nausea and vomiting)    after gallbladder surgery   Psoriasis    Sciatica of left side    Vertigo    1 episode over 1 yr ago     Past Surgical History:  Procedure Laterality Date   BARIATRIC SURGERY     CHOLECYSTECTOMY     COLONOSCOPY WITH PROPOFOL  N/A 05/07/2020   Procedure: COLONOSCOPY WITH PROPOFOL ;  Surgeon: Irby Mannan, MD;  Location: [facility];  Service: Endoscopy;  Laterality: N/A;   DILATION AND CURETTAGE OF UTERUS     ESOPHAGOGASTRODUODENOSCOPY (EGD) WITH PROPOFOL  N/A 05/07/2020   Procedure: ESOPHAGOGASTRODUODENOSCOPY (EGD) WITH BIOPSY;  Surgeon: Tully Gainer,  Elbridge Greek, MD;  Location: [facility];  Service: Endoscopy;  Laterality: N/A;   LUMBAR SPINE SURGERY Left 02/2016   Pt reports lumbar surgery without placement of instrumentation to decompress nerve on left side.     Social History   Socioeconomic History   Marital status: Married    Spouse name: Ernestina Headland   Number of children: 1   Years of education: Not on file   Highest education level: Associate degree: occupational,  Scientist, product/process development, or vocational program  Occupational History   Occupation: education/training   Tobacco Use   Smoking status: Never   Smokeless tobacco: Never  Vaping Use   Vaping status: Never Used  Substance and Sexual Activity   Alcohol use: No    Alcohol/week: 0.0 standard drinks of alcohol   Drug use: No   Sexual activity: Yes    Partners: Male    Birth control/protection: None  Other Topics Concern   Not on file  Social History Narrative   Not on file   Social Drivers of Health   Financial Resource Strain: Low Risk  (10/14/2023)   Overall Financial Resource Strain (CARDIA)    Difficulty of Paying Living Expenses: Not hard at all  Food Insecurity: No Food Insecurity (10/14/2023)   Hunger Vital Sign    Worried About Running Out of Food in the Last Year: Never true    Ran Out of Food in the Last Year: Never true  Transportation Needs: No Transportation Needs (10/14/2023)   PRAPARE - Administrator, Civil Service (Medical): No    Lack of Transportation (Non-Medical): No  Physical Activity: Insufficiently Active (10/14/2023)   Exercise Vital Sign    Days of Exercise per Week: 2 days    Minutes of Exercise per Session: 20 min  Stress: Stress Concern Present (10/14/2023)   [employer] of Occupational Health - Occupational Stress Questionnaire    Feeling of Stress : To some extent  Social Connections: Moderately Integrated (10/14/2023)   Social Connection and Isolation Panel [NHANES]    Frequency of Communication with Friends and Family: More than three times a week    Frequency of Social Gatherings with Friends and Family: More than three times a week    Attends Religious Services: More than 4 times per year    Active Member of [employer] or Organizations: No    Attends Banker Meetings: Never    Marital Status: Married  Catering manager Violence: Not At Risk (10/14/2023)   Humiliation, Afraid, Rape, and Kick questionnaire    Fear of Current or Ex-Partner:  No    Emotionally Abused: No    Physically Abused: No    Sexually Abused: No    Family History  Problem Relation Age of Onset   Diabetes Mother    Skin cancer Mother    Diabetes Father    COPD Father    Seizures Father    Heart disease Father 68       CABG   Diabetes Brother    Multiple sclerosis Brother    Stroke Paternal Grandmother      Current Outpatient Medications:    acetaminophen  (TYLENOL ) 500 MG tablet, Take 1,000 mg by mouth as needed for moderate pain., Disp: , Rfl:    Apremilast  (OTEZLA ) 30 MG TABS, Take 1 tablet (30 mg total) by mouth 2 (two) times daily., Disp: 60 tablet, Rfl: 11   benzonatate  (TESSALON ) 100 MG capsule, Take 1-2 capsules (100-200 mg total) by mouth 3 (three) times daily  as needed for cough. (Patient not taking: Reported on 01/01/2024), Disp: 40 capsule, Rfl: 0   cholecalciferol (VITAMIN D3) 25 MCG (1000 UT) tablet, Take 2,000 Units by mouth daily., Disp: , Rfl:    DULoxetine  (CYMBALTA ) 60 MG capsule, Take 1 capsule (60 mg total) by mouth daily., Disp: 90 capsule, Rfl: 1   metFORMIN  (GLUCOPHAGE -XR) 500 MG 24 hr tablet, Take 1 tablet (500 mg total) by mouth daily with breakfast., Disp: 90 tablet, Rfl: 1   methocarbamol  (ROBAXIN ) 500 MG tablet, TAKE 1 TABLET(500 MG) BY MOUTH TWICE DAILY AS NEEDED FOR MUSCLE SPASMS (Patient not taking: Reported on 10/02/2023), Disp: 180 tablet, Rfl: 0   Tapinarof  (VTAMA ) 1 % CREA, Apply 1 application topically daily. Apply to affected pink areas of hands and body for psoriasis (Patient not taking: Reported on 10/02/2023), Disp: 60 g, Rfl: 6  Physical exam:  Vitals:   01/01/24 1357  BP: 120/76  Pulse: (!) 101  Resp: 19  Temp: 98.1 F (36.7 C)  TempSrc: Tympanic  SpO2: 99%  Weight: 163 lb 4.8 oz (74.1 kg)  Height: 5' 0.25" (1.53 m)   Physical Exam Cardiovascular:     Rate and Rhythm: Normal rate and regular rhythm.     Heart sounds: Normal heart sounds.  Pulmonary:     Effort: Pulmonary effort is normal.   Skin:    General: Skin is warm and dry.  Neurological:     Mental Status: She is alert and oriented to person, place, and time.      I have personally reviewed labs listed below:    Latest Ref Rng & Units 12/30/2023    1:15 PM  CMP  Glucose 70 - 99 mg/dL 86   BUN 6 - 24 mg/dL 9   Creatinine 1.61 - 0.96 mg/dL 0.45   Sodium 409 - 811 mmol/L 142   Potassium 3.5 - 5.2 mmol/L 4.8   Chloride 96 - 106 mmol/L 101   CO2 20 - 29 mmol/L 23   Calcium 8.7 - 10.2 mg/dL 9.8   Total Protein 6.0 - 8.5 g/dL 7.3   Total Bilirubin 0.0 - 1.2 mg/dL 0.7   Alkaline Phos 44 - 121 IU/L 117   AST 0 - 40 IU/L 20   ALT 0 - 32 IU/L 16       Latest Ref Rng & Units 12/30/2023    1:15 PM  CBC  WBC 3.4 - 10.8 x10E3/uL 5.9   Hemoglobin 11.1 - 15.9 g/dL 91.4   Hematocrit 78.2 - 46.6 % 47.0   Platelets 150 - 450 x10E3/uL 346      Assessment and plan- Patient is a 58 y.o. female here for routine follow-up of iron deficiency anemia  Patient is not currently anemic with an H&H of 15.6/47.  Ferritin levels however are borderline low at 36 with an elevated TIBC.  Patient does report ongoing fatigue.  She has a prior history of gastric bypass.  I will proceed with 1 dose of Feraheme  at this time.  Discussed risks and benefits of IV iron including all but not limited to possible risk of infusion and anaphylactic reaction.  Patient understands and agrees to proceed as planned.  She has received Feraheme  in the past and has tolerated well.  Patient will continue to get labs checked every 4 months at [facility] and I will see her back in 1 years time   Visit Diagnosis 1. Iron deficiency anemia, unspecified iron deficiency anemia type      Dr. Lebron Prows  Randy Buttery, MD, MPH [facility] [phone] 01/01/2024 3:10 PM

## 2024-01-08 ENCOUNTER — Inpatient Hospital Stay

## 2024-01-08 VITALS — BP 111/83 | HR 86 | Temp 96.3°F | Resp 18

## 2024-01-08 DIAGNOSIS — D508 Other iron deficiency anemias: Secondary | ICD-10-CM

## 2024-01-08 DIAGNOSIS — Z9884 Bariatric surgery status: Secondary | ICD-10-CM | POA: Diagnosis not present

## 2024-01-08 DIAGNOSIS — Z79899 Other long term (current) drug therapy: Secondary | ICD-10-CM | POA: Diagnosis not present

## 2024-01-08 DIAGNOSIS — R5383 Other fatigue: Secondary | ICD-10-CM | POA: Diagnosis not present

## 2024-01-08 DIAGNOSIS — D509 Iron deficiency anemia, unspecified: Secondary | ICD-10-CM | POA: Diagnosis not present

## 2024-01-08 MED ORDER — SODIUM CHLORIDE 0.9 % IV SOLN
INTRAVENOUS | Status: DC
  Filled 2024-01-08 (×2): qty 250

## 2024-01-08 MED ORDER — SODIUM CHLORIDE 0.9 % IV SOLN
510.0000 mg | Freq: Once | INTRAVENOUS | Status: AC
  Administered 2024-01-08: 510 mg via INTRAVENOUS
  Filled 2024-01-08: qty 510

## 2024-01-08 NOTE — Patient Instructions (Signed)

## 2024-02-24 ENCOUNTER — Ambulatory Visit: Payer: Self-pay | Admitting: Family Medicine

## 2024-02-29 ENCOUNTER — Encounter: Payer: Self-pay | Admitting: Family Medicine

## 2024-02-29 ENCOUNTER — Ambulatory Visit: Admitting: Family Medicine

## 2024-02-29 VITALS — BP 104/68 | HR 99 | Resp 16 | Ht 60.25 in | Wt 161.6 lb

## 2024-02-29 DIAGNOSIS — F330 Major depressive disorder, recurrent, mild: Secondary | ICD-10-CM

## 2024-02-29 DIAGNOSIS — Z9884 Bariatric surgery status: Secondary | ICD-10-CM

## 2024-02-29 DIAGNOSIS — E538 Deficiency of other specified B group vitamins: Secondary | ICD-10-CM

## 2024-02-29 DIAGNOSIS — M62838 Other muscle spasm: Secondary | ICD-10-CM

## 2024-02-29 DIAGNOSIS — G8929 Other chronic pain: Secondary | ICD-10-CM

## 2024-02-29 DIAGNOSIS — M5442 Lumbago with sciatica, left side: Secondary | ICD-10-CM

## 2024-02-29 DIAGNOSIS — E559 Vitamin D deficiency, unspecified: Secondary | ICD-10-CM

## 2024-02-29 DIAGNOSIS — E8881 Metabolic syndrome: Secondary | ICD-10-CM | POA: Diagnosis not present

## 2024-02-29 DIAGNOSIS — L409 Psoriasis, unspecified: Secondary | ICD-10-CM

## 2024-02-29 DIAGNOSIS — R000 Tachycardia, unspecified: Secondary | ICD-10-CM

## 2024-02-29 DIAGNOSIS — Z862 Personal history of diseases of the blood and blood-forming organs and certain disorders involving the immune mechanism: Secondary | ICD-10-CM

## 2024-02-29 DIAGNOSIS — E785 Hyperlipidemia, unspecified: Secondary | ICD-10-CM

## 2024-02-29 MED ORDER — METHOCARBAMOL 500 MG PO TABS: 500.0000 mg | ORAL_TABLET | Freq: Two times a day (BID) | ORAL | 0 refills | Status: DC | PRN

## 2024-02-29 MED ORDER — DULOXETINE HCL 60 MG PO CPEP: 60.0000 mg | ORAL_CAPSULE | Freq: Every day | ORAL | 1 refills | Status: DC

## 2024-02-29 MED ORDER — METFORMIN HCL ER 500 MG PO TB24: 500.0000 mg | ORAL_TABLET | Freq: Every day | ORAL | 1 refills | Status: DC

## 2024-02-29 MED ORDER — DICLOFENAC EPOLAMINE 1.3 % EX PTCH: 1.0000 | MEDICATED_PATCH | Freq: Two times a day (BID) | CUTANEOUS | 0 refills | Status: AC

## 2024-02-29 NOTE — Progress Notes (Signed)
 Name: Ashlee Cruz   MRN: [id number]    DOB: [birth year]   Date:02/29/2024       Progress Note  Subjective  Chief Complaint  Chief Complaint  Patient presents with   Medical Management of Chronic Issues   Discussed the use of AI scribe software for clinical note transcription with the patient, who gave verbal consent to proceed.  History of Present Illness Ashlee Cruz is a 58 year old female with a history of bariatric surgery who presents for a six-month follow-up.  She has a history of bariatric surgery, leading to malabsorption issues, including iron deficiency anemia. She recently received an iron infusion, resulting in improved knee and leg pain. Her ferritin level was 36, lower than before, but her symptoms improved post-infusion.  She has a history of B12 deficiency, with normal levels at 769 in May. She takes a multivitamin, vitamin D , and B12 supplements, although she has not been taking B12 recently due to previously high levels.  She experiences back spasms and leg cramps, particularly at night, affecting her sleep. She uses methocarbamol , which she last filled in November and recently ran out of. Her symptoms improved after the iron infusion.  She has a history of major depression, phq 9 is positive now - she is worried about her mother . She  takes duloxetine  60 mg daily. She feels stressed due to her mother's health situation. Her mother, aged 1, had a hemoglobin level of 6 and ferritin of 1.9, leading to an emergency room visit. She is concerned about her mother's health and care logistics, as her mother lives in [place] and does not want to move closer.  Her metabolic syndrome is being monitored, with normal A1c levels since 2021. No symptoms of diabetes such as excessive hunger, thirst, or frequent urination.  She reports occasional sciatica and chronic low back pain, worsening with activity, managed with muscle relaxers and Tylenol  as needed.    Patient Active Problem  List   Diagnosis Date Noted   Iron deficiency anemia 05/02/2020   Tachycardia 05/19/2017   Palpitations 05/19/2017   Fibroid uterus 12/20/2015   History of iron deficiency anemia 10/23/2015   Left lumbar radiculitis 10/23/2015   Absence of menstruation 04/26/2015   B12 deficiency 04/26/2015   Depression, major, recurrent, mild (HCC) 04/26/2015   Dyslipidemia 04/26/2015   Dysmetabolic syndrome 04/26/2015   Bariatric surgery status 04/26/2015   H/O: HTN (hypertension) 04/26/2015   Psoriasis 04/26/2015   Allergic rhinitis, seasonal 04/26/2015   Vitamin D  deficiency 04/26/2015   Bilateral knee pain 04/26/2015    Past Surgical History:  Procedure Laterality Date   BARIATRIC SURGERY     CHOLECYSTECTOMY     COLONOSCOPY WITH PROPOFOL  N/A 05/07/2020   Procedure: COLONOSCOPY WITH PROPOFOL ;  Surgeon: Janalyn Keene NOVAK, MD;  Location: [facility];  Service: Endoscopy;  Laterality: N/A;   DILATION AND CURETTAGE OF UTERUS     ESOPHAGOGASTRODUODENOSCOPY (EGD) WITH PROPOFOL  N/A 05/07/2020   Procedure: ESOPHAGOGASTRODUODENOSCOPY (EGD) WITH BIOPSY;  Surgeon: Janalyn Keene NOVAK, MD;  Location: [facility];  Service: Endoscopy;  Laterality: N/A;   LUMBAR SPINE SURGERY Left 02/2016   Pt reports lumbar surgery without placement of instrumentation to decompress nerve on left side.     Family History  Problem Relation Age of Onset   Diabetes Mother    Skin cancer Mother    Diabetes Father    COPD Father    Seizures Father    Heart disease Father 75  CABG   Diabetes Brother    Multiple sclerosis Brother    Stroke Paternal Grandmother     Social History   Tobacco Use   Smoking status: Never   Smokeless tobacco: Never  Substance Use Topics   Alcohol use: No    Alcohol/week: 0.0 standard drinks of alcohol     Current Outpatient Medications:    acetaminophen  (TYLENOL ) 500 MG tablet, Take 1,000 mg by mouth as needed for moderate pain., Disp: , Rfl:    Apremilast   (OTEZLA ) 30 MG TABS, Take 1 tablet (30 mg total) by mouth 2 (two) times daily., Disp: 60 tablet, Rfl: 11   cholecalciferol (VITAMIN D3) 25 MCG (1000 UT) tablet, Take 2,000 Units by mouth daily., Disp: , Rfl:    DULoxetine  (CYMBALTA ) 60 MG capsule, Take 1 capsule (60 mg total) by mouth daily., Disp: 90 capsule, Rfl: 1   metFORMIN  (GLUCOPHAGE -XR) 500 MG 24 hr tablet, Take 1 tablet (500 mg total) by mouth daily with breakfast., Disp: 90 tablet, Rfl: 1   benzonatate  (TESSALON ) 100 MG capsule, Take 1-2 capsules (100-200 mg total) by mouth 3 (three) times daily as needed for cough. (Patient not taking: Reported on 02/29/2024), Disp: 40 capsule, Rfl: 0   methocarbamol  (ROBAXIN ) 500 MG tablet, TAKE 1 TABLET(500 MG) BY MOUTH TWICE DAILY AS NEEDED FOR MUSCLE SPASMS (Patient not taking: Reported on 02/29/2024), Disp: 180 tablet, Rfl: 0   Tapinarof  (VTAMA ) 1 % CREA, Apply 1 application topically daily. Apply to affected pink areas of hands and body for psoriasis (Patient not taking: Reported on 02/29/2024), Disp: 60 g, Rfl: 6  Allergies  Allergen Reactions   Other Other (See Comments)    Any fragrant causes psoriasus   Latex     Pt reports prolonged exposure causes aggravation in psoriasis   Codeine Rash    I personally reviewed active problem list, medication list, allergies with the patient/caregiver today.   ROS  Ten systems reviewed and is negative except as mentioned in HPI    Objective Physical Exam  CONSTITUTIONAL: Patient appears well-developed and well-nourished.  No distress. HEENT: Head atraumatic, normocephalic, neck supple. CARDIOVASCULAR: Normal rate, regular rhythm and normal heart sounds.  No murmur heard. No BLE edema. PULMONARY: Effort normal and breath sounds normal. No respiratory distress. ABDOMINAL: There is no tenderness or distention. MUSCULOSKELETAL: Normal gait. Without gross motor or sensory deficit. PSYCHIATRIC: Patient has a normal mood and affect. behavior is normal.  Judgment and thought content normal.  Vitals:   02/29/24 0846  BP: 104/68  Pulse: 99  Resp: 16  SpO2: 95%  Weight: 161 lb 9.6 oz (73.3 kg)  Height: 5' 0.25 (1.53 m)    Body mass index is 31.3 kg/m.  Recent Results (from the past 2160 hours)  Lipid panel     Status: Abnormal   Collection Time: 12/30/23  1:15 PM  Result Value Ref Range   Cholesterol, Total 275 (H) 100 - 199 mg/dL   Triglycerides 90 0 - 149 mg/dL   HDL 889 >60 mg/dL   VLDL Cholesterol Cal 15 5 - 40 mg/dL   LDL Chol Calc (NIH) 849 (H) 0 - 99 mg/dL   Chol/HDL Ratio 2.5 0.0 - 4.4 ratio    Comment:                                   T. Chol/HDL Ratio  Men  Women                               1/2 Avg.Risk  3.4    3.3                                   Avg.Risk  5.0    4.4                                2X Avg.Risk  9.6    7.1                                3X Avg.Risk 23.4   11.0   CBC with Differential/Platelet     Status: Abnormal   Collection Time: 12/30/23  1:15 PM  Result Value Ref Range   WBC 5.9 3.4 - 10.8 x10E3/uL   RBC 4.98 3.77 - 5.28 x10E6/uL   Hemoglobin 15.6 11.1 - 15.9 g/dL   Hematocrit 52.9 (H) 65.9 - 46.6 %   MCV 94 79 - 97 fL   MCH 31.3 26.6 - 33.0 pg   MCHC 33.2 31.5 - 35.7 g/dL   RDW 88.1 88.2 - 84.5 %   Platelets 346 150 - 450 x10E3/uL   Neutrophils 58 Not Estab. %   Lymphs 29 Not Estab. %   Monocytes 8 Not Estab. %   Eos 4 Not Estab. %   Basos 1 Not Estab. %   Neutrophils Absolute 3.5 1.4 - 7.0 x10E3/uL   Lymphocytes Absolute 1.7 0.7 - 3.1 x10E3/uL   Monocytes Absolute 0.4 0.1 - 0.9 x10E3/uL   EOS (ABSOLUTE) 0.2 0.0 - 0.4 x10E3/uL   Basophils Absolute 0.1 0.0 - 0.2 x10E3/uL   Immature Granulocytes 0 Not Estab. %   Immature Grans (Abs) 0.0 0.0 - 0.1 x10E3/uL  Comprehensive metabolic panel     Status: None   Collection Time: 12/30/23  1:15 PM  Result Value Ref Range   Glucose 86 70 - 99 mg/dL   BUN 9 6 - 24 mg/dL   Creatinine, Ser 9.32  0.57 - 1.00 mg/dL   eGFR 896 >40 fO/fpw/8.26   BUN/Creatinine Ratio 13 9 - 23   Sodium 142 134 - 144 mmol/L   Potassium 4.8 3.5 - 5.2 mmol/L   Chloride 101 96 - 106 mmol/L   CO2 23 20 - 29 mmol/L   Calcium 9.8 8.7 - 10.2 mg/dL   Total Protein 7.3 6.0 - 8.5 g/dL   Albumin 4.9 3.8 - 4.9 g/dL   Globulin, Total 2.4 1.5 - 4.5 g/dL   Bilirubin Total 0.7 0.0 - 1.2 mg/dL   Alkaline Phosphatase 117 44 - 121 IU/L   AST 20 0 - 40 IU/L   ALT 16 0 - 32 IU/L  VITAMIN D  25 Hydroxy (Vit-D Deficiency, Fractures)     Status: None   Collection Time: 12/30/23  1:15 PM  Result Value Ref Range   Vit D, 25-Hydroxy 73.2 30.0 - 100.0 ng/mL    Comment: Vitamin D  deficiency has been defined by the Institute of Medicine and an Endocrine Society practice guideline as a level of serum 25-OH vitamin D  less than 20 ng/mL (1,2). The Endocrine Society went on to further define vitamin D  insufficiency as a  level between 21 and 29 ng/mL (2). 1. IOM (Institute of Medicine). 2010. Dietary reference    intakes for calcium and D. [place]: The    [employer]. 2. Holick MF, Binkley [place], Bischoff-Ferrari HA, et al.    Evaluation, treatment, and prevention of vitamin D     deficiency: an Endocrine Society clinical practice    guideline. JCEM. 2011 Jul; 96(7):1911-30.   Hemoglobin A1c     Status: None   Collection Time: 12/30/23  1:15 PM  Result Value Ref Range   Hgb A1c MFr Bld 5.6 4.8 - 5.6 %    Comment:          Prediabetes: 5.7 - 6.4          Diabetes: >6.4          Glycemic control for adults with diabetes: <7.0    Est. average glucose Bld gHb Est-mCnc 114 mg/dL  A87 and Folate Panel     Status: None   Collection Time: 12/30/23  1:15 PM  Result Value Ref Range   Vitamin B-12 769 232 - 1,245 pg/mL   Folate >20.0 >3.0 ng/mL    Comment: A serum folate concentration of less than 3.1 ng/mL is considered to represent clinical deficiency.   TSH     Status: None   Collection Time: 12/30/23  1:15  PM  Result Value Ref Range   TSH 1.680 0.450 - 4.500 uIU/mL  Iron, TIBC and Ferritin Panel     Status: Abnormal   Collection Time: 12/30/23  1:15 PM  Result Value Ref Range   Total Iron Binding Capacity 472 (H) 250 - 450 ug/dL   UIBC 645 868 - 574 ug/dL   Iron 881 27 - 840 ug/dL   Iron Saturation 25 15 - 55 %   Ferritin 36 15 - 150 ng/mL    Diabetic Foot Exam:     PHQ2/9:    02/29/2024    8:44 AM 10/14/2023    8:01 AM 10/02/2023   11:44 AM 08/25/2023    8:54 AM 02/23/2023    8:05 AM  Depression screen PHQ 2/9  Decreased Interest 1 0 0 0 0  Down, Depressed, Hopeless 1 0 0 0 0  PHQ - 2 Score 2 0 0 0 0  Altered sleeping 1 0 0 0 1  Tired, decreased energy 1 0 0 0 0  Change in appetite 1 0 0 0 0  Feeling bad or failure about yourself  0 0 0 0 0  Trouble concentrating 0 0 0 0 1  Moving slowly or fidgety/restless 0 0 0 0 0  Suicidal thoughts 0 0 0 0 0  PHQ-9 Score 5 0 0 0 2  Difficult doing work/chores Somewhat difficult Not difficult at all Not difficult at all Not difficult at all Not difficult at all    phq 9 is positive  Fall Risk:    02/29/2024    8:40 AM 08/25/2023    8:48 AM 02/23/2023    8:04 AM 08/25/2022    7:58 AM 07/31/2022    9:48 AM  Fall Risk   Falls in the past year? 0 0 0 0 0  Number falls in past yr: 0 0 0 0 0  Injury with Fall? 0 0 0 0 0  Risk for fall due to : No Fall Risks No Fall Risks  No Fall Risks No Fall Risks  Follow up Falls evaluation completed Falls prevention discussed;Education provided;Falls evaluation completed  Falls prevention discussed  Falls prevention discussed;Education provided;Falls evaluation completed      Data saved with a previous flowsheet row definition    Assessment & Plan Iron deficiency due to bariatric surgery Iron deficiency secondary to malabsorption from bariatric surgery. Ferritin level decreased to 36. Reports improvement in symptoms following recent iron infusion.  Chronic low back pain with intermittent left sciatica  and muscle spasms Chronic low back pain with intermittent left-sided sciatica and muscle spasms. Symptoms improve with muscle relaxants and acetaminophen . Iron infusion has improved associated leg pain, likely related to restless leg syndrome. - Prescribe methocarbamol  180 tablets for muscle spasms. - Recommend over-the-counter Lidoderm  4% patches for pain management. - Consider diclofenac  patch for anti-inflammatory effect if needed.  Tachycardia with palpitations Tachycardia with palpitations, heart rate at 99 bpm. Blood pressure on the low side. Previous cardiology evaluation did not indicate need for intervention. Beta-blockers not recommended due to low blood pressure. - Monitor heart rate and symptoms.  Obesity BMI is 31.3, indicating obesity. Weight decreased from 163 to 161.6 pounds, likely due to stress. Encouraged to continue weight loss efforts to reach overweight range. - Encourage continued weight loss through diet and exercise.  Hyperlipidemia Total cholesterol elevated at 275 mg/dL, LDL at 849 mg/dL, and HDL at 889 mg/dL. Risk of heart attack and stroke cannot be calculated due to high HDL. No history of diabetes, stroke, or heart attack. - Encourage dietary modifications to lower LDL cholesterol.  B12 deficiency, currently managed B12 levels now within normal range at 769. Advised to resume B12 supplementation to prevent decline. - Resume B12 supplementation three times a week.  Prediabetes, currently controlled Prediabetes with current A1c levels within normal range (5.5-5.6). No symptoms of diabetes reported. - Continue monitoring A1c levels annually. - Maintain current lifestyle modifications.  Depression in remission, on duloxetine  Major depression in remission, currently managed with duloxetine  60 mg. Reports increased stress due to mother's health issues but continues medication. - Continue duloxetine  60 mg daily.  Psoriasis, well-managed on Otezla  Psoriasis  well-managed on Otezla . - Continue Otezla  as needed.  Vitamin D  supplementation adjustment Vitamin D  level at 73, slightly above target range. - Adjust vitamin D  supplementation to six days a week.

## 2024-03-03 DIAGNOSIS — Z135 Encounter for screening for eye and ear disorders: Secondary | ICD-10-CM | POA: Diagnosis not present

## 2024-03-03 DIAGNOSIS — D3131 Benign neoplasm of right choroid: Secondary | ICD-10-CM | POA: Diagnosis not present

## 2024-03-09 ENCOUNTER — Telehealth: Payer: Self-pay | Admitting: Pharmacy Technician

## 2024-03-09 ENCOUNTER — Encounter: Payer: Self-pay | Admitting: Oncology

## 2024-03-09 ENCOUNTER — Other Ambulatory Visit (HOSPITAL_COMMUNITY): Payer: Self-pay

## 2024-03-09 NOTE — Telephone Encounter (Signed)
 Brandy from [employer] called in states needs questions answered on a different form for PA on Diclofenac  Epolamine 1.3% . Please cb

## 2024-03-09 NOTE — Telephone Encounter (Signed)
 Thanks, I just filled the form out and just faxed it back.

## 2024-03-09 NOTE — Telephone Encounter (Signed)
 Pharmacy Patient Advocate Encounter   Received notification from Onbase that prior authorization for Diclofenac  Epolamine 1.3% patches is required/requested.   Insurance verification completed.   The patient is insured through [facility] .   Per test claim: PA required; PA submitted to above mentioned insurance via CoverMyMeds Key/confirmation #/EOC [id number] Status is pending

## 2024-03-10 ENCOUNTER — Other Ambulatory Visit (HOSPITAL_COMMUNITY): Payer: Self-pay

## 2024-03-10 NOTE — Telephone Encounter (Signed)
 Pharmacy Patient Advocate Encounter  Received notification from [facility] that Prior Authorization for Diclofenac  Epolamine 1.3% patches has been DENIED.  No reason given; No denial letter received via Fax or CMM. It has been requested and will be uploaded to the media tab once received.   PA #/Case ID/Reference #: [id number]

## 2024-05-17 ENCOUNTER — Ambulatory Visit: Admitting: Dermatology

## 2024-05-25 ENCOUNTER — Ambulatory Visit
Admission: RE | Admit: 2024-05-25 | Discharge: 2024-05-25 | Disposition: A | Discharge status: Home / Self Care | Source: Ambulatory Visit | Attending: Family Medicine | Admitting: Family Medicine

## 2024-05-25 DIAGNOSIS — Z1231 Encounter for screening mammogram for malignant neoplasm of breast: Secondary | ICD-10-CM | POA: Insufficient documentation

## 2024-06-28 DIAGNOSIS — D509 Iron deficiency anemia, unspecified: Secondary | ICD-10-CM | POA: Diagnosis not present

## 2024-06-29 ENCOUNTER — Encounter: Payer: Self-pay | Admitting: Oncology

## 2024-08-02 ENCOUNTER — Encounter: Payer: Self-pay | Admitting: Dermatology

## 2024-08-02 ENCOUNTER — Ambulatory Visit: Admitting: Dermatology

## 2024-08-02 DIAGNOSIS — Z79899 Other long term (current) drug therapy: Secondary | ICD-10-CM | POA: Diagnosis not present

## 2024-08-02 DIAGNOSIS — L409 Psoriasis, unspecified: Secondary | ICD-10-CM

## 2024-08-02 MED ORDER — OTEZLA 30 MG PO TABS: 1.0000 | ORAL_TABLET | Freq: Two times a day (BID) | ORAL | 5 refills | Status: AC

## 2024-08-02 NOTE — Patient Instructions (Signed)

## 2024-08-02 NOTE — Progress Notes (Unsigned)
° °  Follow-Up Visit   Subjective  Ashlee Cruz is a 58 y.o. female who presents for the following: Psoriasis hands, 77m f/u, Otezla  30mg  bid, occasional headaches, Epionce medical barrier qd   The following portions of the chart were reviewed this encounter and updated as appropriate: medications, allergies, medical history  Review of Systems:  No other skin or systemic complaints except as noted in HPI or Assessment and Plan.  Objective  Well appearing patient in no apparent distress; mood and affect are within normal limits.   A focused examination was performed of the following areas: hands  Relevant exam findings are noted in the Assessment and Plan.    Assessment & Plan   PSORIASIS Hands Patient has been on Otezla   since 04/2020 No complaints diarrhea, nausea, upper respiratory infection, depression, or weight loss, occasional headaches but not annoying enough to d/c Otezla  Exam:  xerosis hands 2% BSA on Otezla  Chronic condition with duration or expected duration over one year. Currently well-controlled. patient denies joint pain  Psoriasis is a chronic non-curable, but treatable genetic/hereditary disease that may have other systemic features affecting other organ systems such as joints (Psoriatic Arthritis). It is associated with an increased risk of inflammatory bowel disease, heart disease, non-alcoholic fatty liver disease, and depression.  Treatments include light and laser treatments; topical medications; and systemic medications including oral and injectables.  Treatment Plan: Cont Otezla  30mg  1 po bid  Cont Epionce Medical Barrier prn  Side effects of Otezla  (apremilast ) include diarrhea, nausea, headache, upper respiratory infection, depression, and weight decrease (5-10%). It should only be taken by pregnant women after a discussion regarding risks and benefits with their doctor. Goal is control of skin condition, not cure.  The use of Otezla  requires long term  medication management, including periodic office visits. PSORIASIS   This Visit - Apremilast  (OTEZLA ) 30 MG TABS - Take 1 tablet (30 mg total) by mouth 2 (two) times daily. Existing Treatments - Tapinarof  (VTAMA ) 1 % CREA - Apply 1 application topically daily. Apply to affected pink areas of hands and body for psoriasis  Return for as scheduled for TBSE and , Psoriasis f/u.  I, Grayce Saunas, RMA, am acting as scribe for Alm Rhyme, MD .   Documentation: I have reviewed the above documentation for accuracy and completeness, and I agree with the above.  Alm Rhyme, MD

## 2024-08-24 ENCOUNTER — Encounter: Payer: Self-pay | Admitting: Oncology

## 2024-08-24 ENCOUNTER — Telehealth: Admitting: Emergency Medicine

## 2024-08-24 ENCOUNTER — Ambulatory Visit: Payer: Self-pay

## 2024-08-24 ENCOUNTER — Encounter: Payer: Self-pay | Admitting: Emergency Medicine

## 2024-08-24 DIAGNOSIS — J069 Acute upper respiratory infection, unspecified: Secondary | ICD-10-CM | POA: Diagnosis not present

## 2024-08-24 MED ORDER — NIRMATRELVIR/RITONAVIR (PAXLOVID)TABLET: 3.0000 | ORAL_TABLET | Freq: Two times a day (BID) | ORAL | 0 refills | Status: AC

## 2024-08-24 MED ORDER — AZELASTINE HCL 0.1 % NA SOLN: 2.0000 | Freq: Two times a day (BID) | NASAL | 0 refills | Status: AC

## 2024-08-24 MED ORDER — PROMETHAZINE-DM 6.25-15 MG/5ML PO SYRP: 5.0000 mL | ORAL_SOLUTION | Freq: Four times a day (QID) | ORAL | 0 refills | Status: DC | PRN

## 2024-08-24 NOTE — Progress Notes (Signed)
 " Virtual Visit Consent   [name], you are scheduled for a virtual visit with a [facility] provider today. Just as with appointments in the office, your consent must be obtained to participate. Your consent will be active for this visit and any virtual visit you may have with one of our providers in the next 365 days. If you have a MyChart account, a copy of this consent can be sent to you electronically.  As this is a virtual visit, video technology does not allow for your provider to perform a traditional examination. This may limit your provider's ability to fully assess your condition. If your provider identifies any concerns that need to be evaluated in person or the need to arrange testing (such as labs, EKG, etc.), we will make arrangements to do so. Although advances in technology are sophisticated, we cannot ensure that it will always work on either your end or our end. If the connection with a video visit is poor, the visit may have to be switched to a telephone visit. With either a video or telephone visit, we are not always able to ensure that we have a secure connection.  By engaging in this virtual visit, you consent to the provision of healthcare and authorize for your insurance to be billed (if applicable) for the services provided during this visit. Depending on your insurance coverage, you may receive a charge related to this service.  I need to obtain your verbal consent now. Are you willing to proceed with your visit today? Ashlee Cruz has provided verbal consent on 08/24/2024 for a virtual visit (video or telephone). Jon CHRISTELLA Belt, NP  Date: 08/24/2024 5:28 PM   Virtual Visit via Video Note   I, Jon CHRISTELLA Belt, connected with  Ashlee Cruz  ([id number], 1966/03/18) on 08/24/2024 at  5:00 PM EST by a video-enabled telemedicine application and verified that I am speaking with the correct person using two identifiers.  Location: Patient: Virtual Visit Location Patient:  Home Provider: Virtual Visit Location Provider: Home Office   I discussed the limitations of evaluation and management by telemedicine and the availability of in person appointments. The patient expressed understanding and agreed to proceed.    History of Present Illness: Ashlee Cruz is a 59 y.o. who identifies as a female who was assigned female at birth, and is being seen today for Congestion, scratchy throat. Low grade temp, highest was 100F. Headache. Ear pain and pressure.   Has not tested covid or flu at home.   Sx started 08/22/24. Was at a family event 1-2 days prior and someone was sick there. Sister and mom were also there and area also sick.   Has not tested self for covid or flu.   Taking ibuprofen only.   No SOB or wheezing. 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  Resting comfortably  at home.  Head is normocephalic, atraumatic.  No labored breathing.  Speech is clear and coherent with logical content.  Patient is alert and oriented at baseline.    Assessment and Plan: 1. Upper respiratory tract infection, unspecified type (Primary)  REc testing for covid and flu. If positive, she  will message me prior to my shfit ending at 8pm tonight if she wants antiviral medicine. No recent CMP. I have renal and hepatic function from last May that are normal. If she tests positive for covid and wants paxlovid , she understands risk of not having up to date labs.   Follow Up Instructions: I discussed the assessment and treatment plan with the patient. The patient was provided an opportunity to ask questions and all were answered. The patient agreed with the plan and demonstrated an understanding of the instructions.  A copy of instructions were sent to the patient via MyChart unless otherwise noted below.    The patient was advised to call back or seek an in-person evaluation if the symptoms worsen or if the condition fails to improve as anticipated.    Jon CHRISTELLA Belt, NP     [1]  Allergies Allergen Reactions   Other Other (See Comments)    Any fragrant causes psoriasus   Latex     Pt reports prolonged exposure causes aggravation in psoriasis   Codeine Rash  [2]  Current Outpatient Medications:    azelastine  (ASTELIN ) 0.1 % nasal spray, Place 2 sprays into both nostrils 2 (two) times daily. Use in each nostril as directed, Disp: 30 mL, Rfl: 0   promethazine -dextromethorphan (PROMETHAZINE -DM) 6.25-15 MG/5ML syrup, Take 5 mLs by mouth 4 (four) times daily as needed for cough., Disp: 120 mL, Rfl: 0   acetaminophen  (TYLENOL ) 500 MG tablet, Take 1,000 mg by mouth as needed for moderate pain., Disp: , Rfl:    Apremilast  (OTEZLA ) 30 MG TABS, Take 1 tablet (30 mg total) by mouth 2 (two) times daily., Disp: 60 tablet, Rfl: 5   cholecalciferol (VITAMIN D3) 25 MCG (1000 UT) tablet, Take 2,000 Units by mouth daily., Disp: , Rfl:    Cyanocobalamin  (B-12) 1000 MCG SUBL, Place 1 tablet under the tongue 3 (three) times a week., Disp: , Rfl:    diclofenac  (FLECTOR ) 1.3 % PTCH, Place 1 patch onto the skin 2 (two) times daily., Disp: 60 patch, Rfl: 0   DULoxetine  (CYMBALTA ) 60 MG capsule, Take 1  capsule (60 mg total) by mouth daily., Disp: 90 capsule, Rfl: 1   metFORMIN  (GLUCOPHAGE -XR) 500 MG 24 hr tablet, Take 1 tablet (500 mg total) by mouth daily with breakfast., Disp: 90 tablet, Rfl: 1   methocarbamol  (ROBAXIN ) 500 MG tablet, Take 1 tablet (500 mg total) by mouth 2 (two) times daily as needed for muscle spasms. TAKE 1 TABLET(500 MG) BY MOUTH TWICE DAILY AS NEEDED FOR MUSCLE SPASMS, Disp: 180 tablet, Rfl: 0   Tapinarof  (VTAMA ) 1 % CREA, Apply 1 application topically daily. Apply to affected pink areas of hands and body for psoriasis (Patient not taking: Reported on 02/29/2024), Disp: 60 g, Rfl: 6  "

## 2024-08-24 NOTE — Telephone Encounter (Signed)
 FYI Only or Action Required?: FYI only for provider: appointment scheduled on 08/24/2024 (virtual urgent care).  Patient was last seen in primary care on 02/29/2024 by Glenard Mire, MD.  Called Nurse Triage reporting Cough.  Symptoms began several days ago.  Triage Disposition: See Physician Within 24 Hours  Patient/caregiver understands and will follow disposition?: Yes         Copied from CRM #[id number]. Topic: Clinical - Red Word Triage >> Aug 24, 2024  8:32 AM Eva FALCON wrote: Red Word that prompted transfer to Nurse Triage: Scratchy throat, low grade fever, head congestion, severe cough, over all feels horrible. Reason for Disposition  SEVERE coughing spells (e.g., whooping sound after coughing, vomiting after coughing)  Answer Assessment - Initial Assessment Questions This RN scheduled pt a virtual urgent care visit today as pt did not want to wait to be seen in office tmrw. This RN educated pt on new-worsening symptoms and when to call back. Pt verbalized understanding and agrees to plan.    Onset: Monday, worsened yesterday  Symptoms: Headache Ears feel stopped up Pt states she had 1-2 episodes of loose stools, wouldn't call it diarrhea Dry cough Scratchy throat  Denies: Fever (99.9 F) low grade fever per pt Stomach pain Nausea Difficulty breathing Chest pain Wheezing  Protocols used: Cough - Acute Non-Productive-A-AH

## 2024-08-24 NOTE — Patient Instructions (Signed)
 " Ashlee Cruz, thank you for joining Ashlee CHRISTELLA Belt, NP for today's virtual visit.  While this provider is not your primary care provider (PCP), if your PCP is located in our provider database this encounter information will be shared with them immediately following your visit.   A [facility] MyChart account gives you access to today's visit and all your visits, tests, and labs performed at [facility]  click here if you don't have a [place] MyChart account or go to [email or web address]  Consent: (Patient) Ashlee Cruz provided verbal consent for this virtual visit at the beginning of the encounter.  Current Medications:  Current Outpatient Medications:    azelastine  (ASTELIN ) 0.1 % nasal spray, Place 2 sprays into both nostrils 2 (two) times daily. Use in each nostril as directed, Disp: 30 mL, Rfl: 0   promethazine -dextromethorphan (PROMETHAZINE -DM) 6.25-15 MG/5ML syrup, Take 5 mLs by mouth 4 (four) times daily as needed for cough., Disp: 120 mL, Rfl: 0   acetaminophen  (TYLENOL ) 500 MG tablet, Take 1,000 mg by mouth as needed for moderate pain., Disp: , Rfl:    Apremilast  (OTEZLA ) 30 MG TABS, Take 1 tablet (30 mg total) by mouth 2 (two) times daily., Disp: 60 tablet, Rfl: 5   cholecalciferol (VITAMIN D3) 25 MCG (1000 UT) tablet, Take 2,000 Units by mouth daily., Disp: , Rfl:    Cyanocobalamin  (B-12) 1000 MCG SUBL, Place 1 tablet under the tongue 3 (three) times a week., Disp: , Rfl:    diclofenac  (FLECTOR ) 1.3 % PTCH, Place 1 patch onto the skin 2 (two) times daily., Disp: 60 patch, Rfl: 0   DULoxetine  (CYMBALTA ) 60 MG capsule, Take 1 capsule (60 mg total) by mouth daily., Disp: 90 capsule, Rfl: 1   metFORMIN  (GLUCOPHAGE -XR) 500 MG 24 hr tablet, Take 1 tablet (500 mg total) by mouth daily with breakfast., Disp: 90 tablet, Rfl: 1   methocarbamol  (ROBAXIN ) 500 MG tablet, Take 1 tablet (500 mg total) by mouth 2 (two) times daily as needed for muscle spasms. TAKE 1  TABLET(500 MG) BY MOUTH TWICE DAILY AS NEEDED FOR MUSCLE SPASMS, Disp: 180 tablet, Rfl: 0   Tapinarof  (VTAMA ) 1 % CREA, Apply 1 application topically daily. Apply to affected pink areas of hands and body for psoriasis (Patient not taking: Reported on 02/29/2024), Disp: 60 g, Rfl: 6   Medications ordered in this encounter:  Meds ordered this encounter  Medications   azelastine  (ASTELIN ) 0.1 % nasal spray    Sig: Place 2 sprays into both nostrils 2 (two) times daily. Use in each nostril as directed    Dispense:  30 mL    Refill:  0   promethazine -dextromethorphan (PROMETHAZINE -DM) 6.25-15 MG/5ML syrup    Sig: Take 5 mLs by mouth 4 (four) times daily as needed for cough.    Dispense:  120 mL    Refill:  0     *If you need refills on other medications prior to your next appointment, please contact your pharmacy*  Follow-Up: Call back or seek an in-person evaluation if the symptoms worsen or if the condition fails to improve as anticipated.  [facility] [phone]  Other Instructions  Consider testing for Covid and flu at home. If positive and you want the antiviral medicine for it, I am here until 8pm - you can message or call the number listed above to let me know.   I recommend you start using mucinex and saline nasal spray. Drink plenty of fluids to  stay hydrated.    If you have been instructed to have an in-person evaluation today at a local Urgent Care facility, please use the link below. It will take you to a list of all of our available [facility], including address, phone number and hours of operation. Please do not delay care.  [facility]  If you or a family member do not have a primary care provider, use the link below to schedule a visit and establish care. When you choose a [facility] primary care physician or advanced practice provider, you gain a long-term partner in health. Find a Primary Care Provider  Learn more about [facility]'s in-office and virtual care options: [facility] - Get Care Now  "

## 2024-08-31 ENCOUNTER — Ambulatory Visit: Admitting: Family Medicine

## 2024-08-31 ENCOUNTER — Encounter: Payer: Self-pay | Admitting: Family Medicine

## 2024-08-31 VITALS — BP 110/66 | HR 88 | Resp 16 | Ht 60.25 in | Wt 164.0 lb

## 2024-08-31 DIAGNOSIS — G8929 Other chronic pain: Secondary | ICD-10-CM | POA: Diagnosis not present

## 2024-08-31 DIAGNOSIS — E538 Deficiency of other specified B group vitamins: Secondary | ICD-10-CM

## 2024-08-31 DIAGNOSIS — E559 Vitamin D deficiency, unspecified: Secondary | ICD-10-CM | POA: Diagnosis not present

## 2024-08-31 DIAGNOSIS — E785 Hyperlipidemia, unspecified: Secondary | ICD-10-CM | POA: Diagnosis not present

## 2024-08-31 DIAGNOSIS — Z9884 Bariatric surgery status: Secondary | ICD-10-CM | POA: Diagnosis not present

## 2024-08-31 DIAGNOSIS — M5442 Lumbago with sciatica, left side: Secondary | ICD-10-CM

## 2024-08-31 DIAGNOSIS — E8881 Metabolic syndrome: Secondary | ICD-10-CM | POA: Diagnosis not present

## 2024-08-31 DIAGNOSIS — F325 Major depressive disorder, single episode, in full remission: Secondary | ICD-10-CM | POA: Diagnosis not present

## 2024-08-31 DIAGNOSIS — L409 Psoriasis, unspecified: Secondary | ICD-10-CM | POA: Diagnosis not present

## 2024-08-31 DIAGNOSIS — Z23 Encounter for immunization: Secondary | ICD-10-CM

## 2024-08-31 DIAGNOSIS — Z862 Personal history of diseases of the blood and blood-forming organs and certain disorders involving the immune mechanism: Secondary | ICD-10-CM

## 2024-08-31 MED ORDER — METFORMIN HCL ER 500 MG PO TB24: 500.0000 mg | ORAL_TABLET | Freq: Every day | ORAL | 1 refills | Status: AC

## 2024-08-31 MED ORDER — METHOCARBAMOL 500 MG PO TABS: 500.0000 mg | ORAL_TABLET | Freq: Two times a day (BID) | ORAL | 0 refills | Status: AC | PRN

## 2024-08-31 MED ORDER — ROSUVASTATIN CALCIUM 5 MG PO TABS: 5.0000 mg | ORAL_TABLET | Freq: Every day | ORAL | 3 refills | Status: AC

## 2024-08-31 MED ORDER — DULOXETINE HCL 30 MG PO CPEP: 30.0000 mg | ORAL_CAPSULE | Freq: Every day | ORAL | 1 refills | Status: AC

## 2024-08-31 MED ORDER — DULOXETINE HCL 60 MG PO CPEP: 60.0000 mg | ORAL_CAPSULE | Freq: Every day | ORAL | 1 refills | Status: AC

## 2024-08-31 NOTE — Progress Notes (Signed)
 Name: Ashlee Cruz   MRN: [id number]    DOB: 10-13-1965   Date:08/31/2024       Progress Note  Subjective  Chief Complaint  Chief Complaint  Patient presents with   Medical Management of Chronic Issues   Discussed the use of AI scribe software for clinical note transcription with the patient, who gave verbal consent to proceed.  History of Present Illness Ashlee Cruz is a 59 year old female who presents for follow-up care.  She and her family, including her mother who has dementia, contracted COVID-19 over a week ago after a family gathering. Her mother is starting to feel better but continues to progress in her dementia. There is ongoing family conflict regarding her mother's care, and her mother has been losing weight due to inconsistent eating habits.  She is currently taking duloxetine  60 mg for depression. She experiences increased stress and feeling down during January and February, which she attributes to seasonal changes and situational stress related to her mother's condition.  She has a history of psoriasis and is currently taking Otezla , which is controlling her symptoms well. She has a cream prescribed for flare-ups but has not used it recently.  She has a history of tachycardia but heart rate is normal today . She underwent bariatric surgery in the past and is monitoring for absorption deficiencies. She takes a multivitamin daily, B12, and vitamin D  twice a week due to previously high levels when taken more frequently.  Her cholesterol levels have been high, with a family history of stroke. She is not currently on medication for cholesterol. She acknowledges the need to improve her diet.  She is taking metformin  500 mg XR for metabolic syndrome, which she refers to as more like diabetes. She has not had a bone density test.     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/2015   Dyslipidemia 04/26/2015   Dysmetabolic syndrome 04/26/2015   Bariatric surgery status 04/26/2015   H/O: HTN (hypertension) 04/26/2015   Psoriasis 04/26/2015   Allergic rhinitis, seasonal 04/26/2015   Vitamin D  deficiency 04/26/2015   Bilateral knee pain 04/26/2015    Past Surgical History:  Procedure Laterality Date   BARIATRIC SURGERY     CHOLECYSTECTOMY     COLONOSCOPY WITH PROPOFOL  N/A 05/07/2020   Procedure: COLONOSCOPY WITH PROPOFOL ;  Surgeon: Janalyn Keene NOVAK, MD;  Location: [facility];  Service: Endoscopy;  Laterality: N/A;   DILATION AND CURETTAGE OF UTERUS     ESOPHAGOGASTRODUODENOSCOPY (EGD) WITH PROPOFOL  N/A 05/07/2020   Procedure: ESOPHAGOGASTRODUODENOSCOPY (EGD) WITH BIOPSY;  Surgeon: Janalyn Keene NOVAK, MD;  Location: [facility];  Service: Endoscopy;  Laterality: N/A;   LUMBAR SPINE SURGERY Left 02/2016   Pt reports lumbar surgery without placement of instrumentation to decompress nerve on left side.     Family History  Problem Relation Age of Onset   Diabetes Mother    Skin cancer Mother    Diabetes Father    COPD Father    Seizures Father    Heart disease Father 64       CABG   Diabetes Brother    Multiple sclerosis Brother    Stroke Paternal  Grandmother     Social History   Tobacco Use   Smoking status: Never   Smokeless tobacco: Never  Substance Use Topics   Alcohol use: No    Alcohol/week: 0.0 standard drinks of alcohol    Current Medications[1]  Allergies[2]  I personally reviewed active problem list, medication list, allergies, family history with the patient/caregiver today.   ROS  Ten systems reviewed and is negative except as mentioned in HPI    Objective Physical  Exam  CONSTITUTIONAL: Patient appears well-developed and well-nourished.  No distress. HEENT: Head atraumatic, normocephalic, neck supple. CARDIOVASCULAR: Normal rate, regular rhythm and normal heart sounds.  No murmur heard. No BLE edema. PULMONARY: Effort normal and breath sounds normal. No respiratory distress. ABDOMINAL: There is no tenderness or distention. MUSCULOSKELETAL: Normal gait. Without gross motor or sensory deficit. PSYCHIATRIC: Patient has a normal mood and affect. behavior is normal. Judgment and thought content normal.  Vitals:   08/31/24 0824  BP: 110/66  Pulse: 88  Resp: 16  SpO2: 95%  Weight: 164 lb (74.4 kg)  Height: 5' 0.25 (1.53 m)    Body mass index is 31.76 kg/m.    PHQ2/9:    08/31/2024    8:22 AM 02/29/2024    8:44 AM 10/14/2023    8:01 AM 10/02/2023   11:44 AM 08/25/2023    8:54 AM  Depression screen PHQ 2/9  Decreased Interest 0 1 0 0 0  Down, Depressed, Hopeless 0 1 0 0 0  PHQ - 2 Score 0 2 0 0 0  Altered sleeping 0 1 0 0 0  Tired, decreased energy 0 1 0 0 0  Change in appetite 0 1 0 0 0  Feeling bad or failure about yourself  0 0 0 0 0  Trouble concentrating 0 0 0 0 0  Moving slowly or fidgety/restless 0 0 0 0 0  Suicidal thoughts 0 0 0 0 0  PHQ-9 Score 0 5  0  0  0   Difficult doing work/chores Not difficult at all Somewhat difficult Not difficult at all Not difficult at all Not difficult at all     Data saved with a previous flowsheet row definition    phq 9 is negative  Fall Risk:    08/31/2024    8:19 AM 02/29/2024    8:40 AM 08/25/2023    8:48 AM 02/23/2023    8:04 AM 08/25/2022    7:58 AM  Fall Risk   Falls in the past year? 0 0 0 0 0  Number falls in past yr: 0 0 0 0 0  Injury with Fall? 0 0  0  0  0   Risk for fall due to : No Fall Risks No Fall Risks No Fall Risks  No Fall Risks  Follow up Falls evaluation completed Falls evaluation completed Falls prevention discussed;Education provided;Falls evaluation completed  Falls  prevention discussed      Data saved with a previous flowsheet row definition    Assessment & Plan Major depressive disorder, in remission Depression in remission with duloxetine . Increased stress due to family dynamics and seasonal factors. Prefers temporary increase in duloxetine  during winter. - Increased duloxetine  to 90 mg daily from October to March, then reduce to 60 mg daily.  Psoriasis Well-controlled with Otezla . - Continue Otezla  as prescribed.  Dyslipidemia LDL at 150 mg/dL. Family history of cardiovascular disease. Discussed low-dose statin benefit. - Prescribed rosuvastatin  5 mg daily. - Recheck lipid panel at next visit.  Metabolic  syndrome Managed with metformin . - Continue metformin  500 mg XR as prescribed.  Bariatric surgery status Post-surgery with BMI slightly over 30. Monitoring for absorption deficiencies. - Continue supplementation with multivitamin, B12, and vitamin D . - Ordered bone density scan with mammogram in the fall.  Vitamin B12 deficiency Recent level 769 pg/mL. - Continue B12 supplementation twice a week.  Vitamin D  deficiency Recent level 73 ng/mL. - Continue vitamin D  supplementation twice a week.  Chronic low back pain with left-sided sciatica Managed with muscle relaxers. - Continue muscle relaxers as prescribed.  General health maintenance Discussed importance of flu and pneumonia vaccinations. She is hesitant but advised due to high flu season and exposure risks.        [1]  Current Outpatient Medications:    acetaminophen  (TYLENOL ) 500 MG tablet, Take 1,000 mg by mouth as needed for moderate pain., Disp: , Rfl:    Apremilast  (OTEZLA ) 30 MG TABS, Take 1 tablet (30 mg total) by mouth 2 (two) times daily., Disp: 60 tablet, Rfl: 5   azelastine  (ASTELIN ) 0.1 % nasal spray, Place 2 sprays into both nostrils 2 (two) times daily. Use in each nostril as directed, Disp: 30 mL, Rfl: 0   cholecalciferol (VITAMIN D3) 25 MCG (1000 UT)  tablet, Take 2,000 Units by mouth daily., Disp: , Rfl:    Cyanocobalamin  (B-12) 1000 MCG SUBL, Place 1 tablet under the tongue 3 (three) times a week., Disp: , Rfl:    diclofenac  (FLECTOR ) 1.3 % PTCH, Place 1 patch onto the skin 2 (two) times daily., Disp: 60 patch, Rfl: 0   DULoxetine  (CYMBALTA ) 60 MG capsule, Take 1 capsule (60 mg total) by mouth daily., Disp: 90 capsule, Rfl: 1   metFORMIN  (GLUCOPHAGE -XR) 500 MG 24 hr tablet, Take 1 tablet (500 mg total) by mouth daily with breakfast., Disp: 90 tablet, Rfl: 1   methocarbamol  (ROBAXIN ) 500 MG tablet, Take 1 tablet (500 mg total) by mouth 2 (two) times daily as needed for muscle spasms. TAKE 1 TABLET(500 MG) BY MOUTH TWICE DAILY AS NEEDED FOR MUSCLE SPASMS, Disp: 180 tablet, Rfl: 0   promethazine -dextromethorphan (PROMETHAZINE -DM) 6.25-15 MG/5ML syrup, Take 5 mLs by mouth 4 (four) times daily as needed for cough., Disp: 120 mL, Rfl: 0   Tapinarof  (VTAMA ) 1 % CREA, Apply 1 application topically daily. Apply to affected pink areas of hands and body for psoriasis (Patient not taking: Reported on 08/31/2024), Disp: 60 g, Rfl: 6 [2]  Allergies Allergen Reactions   Other Other (See Comments)    Any fragrant causes psoriasus   Latex     Pt reports prolonged exposure causes aggravation in psoriasis   Codeine Rash

## 2024-10-14 ENCOUNTER — Encounter: Payer: BC Managed Care – PPO | Admitting: Family Medicine

## 2024-11-10 ENCOUNTER — Ambulatory Visit: Admitting: Dermatology

## 2025-01-02 ENCOUNTER — Ambulatory Visit: Admitting: Oncology

## 2025-02-28 ENCOUNTER — Ambulatory Visit: Admitting: Family Medicine
